# Patient Record
Sex: Female | Born: 1990 | Race: White | Hispanic: No | Smoking: Never smoker
Health system: Southern US, Community
[De-identification: ages and names within clinical notes are randomized; demographics above are authoritative.]

## PROBLEM LIST (undated history)

## (undated) ENCOUNTER — Inpatient Hospital Stay (HOSPITAL_COMMUNITY): Payer: Self-pay

## (undated) DIAGNOSIS — F419 Anxiety disorder, unspecified: Secondary | ICD-10-CM

## (undated) DIAGNOSIS — D649 Anemia, unspecified: Secondary | ICD-10-CM

## (undated) DIAGNOSIS — Z8619 Personal history of other infectious and parasitic diseases: Secondary | ICD-10-CM

## (undated) DIAGNOSIS — Z8759 Personal history of other complications of pregnancy, childbirth and the puerperium: Secondary | ICD-10-CM

## (undated) DIAGNOSIS — F329 Major depressive disorder, single episode, unspecified: Secondary | ICD-10-CM

## (undated) DIAGNOSIS — Z87442 Personal history of urinary calculi: Secondary | ICD-10-CM

## (undated) DIAGNOSIS — O139 Gestational [pregnancy-induced] hypertension without significant proteinuria, unspecified trimester: Secondary | ICD-10-CM

## (undated) DIAGNOSIS — E669 Obesity, unspecified: Secondary | ICD-10-CM

## (undated) DIAGNOSIS — E041 Nontoxic single thyroid nodule: Secondary | ICD-10-CM

## (undated) DIAGNOSIS — G43909 Migraine, unspecified, not intractable, without status migrainosus: Secondary | ICD-10-CM

## (undated) DIAGNOSIS — F32A Depression, unspecified: Secondary | ICD-10-CM

## (undated) HISTORY — DX: Gestational (pregnancy-induced) hypertension without significant proteinuria, unspecified trimester: O13.9

## (undated) HISTORY — DX: Personal history of other complications of pregnancy, childbirth and the puerperium: Z87.59

## (undated) HISTORY — DX: Personal history of other infectious and parasitic diseases: Z86.19

## (undated) HISTORY — PX: BARIATRIC SURGERY: SHX1103

## (undated) HISTORY — DX: Personal history of urinary calculi: Z87.442

## (undated) HISTORY — PX: WISDOM TOOTH EXTRACTION: SHX21

---
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
## 2008-04-26 ENCOUNTER — Emergency Department (HOSPITAL_COMMUNITY): Admission: EM | Admit: 2008-04-26 | Discharge: 2008-04-26 | Payer: Self-pay | Admitting: Family Medicine

## 2009-01-19 HISTORY — PX: CHOLECYSTECTOMY: SHX55

## 2009-01-30 ENCOUNTER — Ambulatory Visit: Payer: Self-pay | Admitting: Advanced Practice Midwife

## 2009-01-30 ENCOUNTER — Inpatient Hospital Stay (HOSPITAL_COMMUNITY): Admission: AD | Admit: 2009-01-30 | Discharge: 2009-01-30 | Payer: Self-pay | Admitting: Obstetrics and Gynecology

## 2009-02-11 ENCOUNTER — Emergency Department (HOSPITAL_COMMUNITY): Admission: EM | Admit: 2009-02-11 | Discharge: 2009-02-11 | Payer: Self-pay | Admitting: Emergency Medicine

## 2009-02-14 ENCOUNTER — Emergency Department (HOSPITAL_COMMUNITY): Admission: EM | Admit: 2009-02-14 | Discharge: 2009-02-14 | Payer: Self-pay | Admitting: Emergency Medicine

## 2010-02-03 ENCOUNTER — Ambulatory Visit (HOSPITAL_COMMUNITY): Admission: EM | Admit: 2010-02-03 | Discharge: 2010-02-04 | Payer: Self-pay | Admitting: Emergency Medicine

## 2010-02-05 ENCOUNTER — Emergency Department (HOSPITAL_COMMUNITY): Admission: EM | Admit: 2010-02-05 | Discharge: 2010-02-05 | Payer: Self-pay | Admitting: Emergency Medicine

## 2010-06-24 ENCOUNTER — Emergency Department (HOSPITAL_COMMUNITY)
Admission: EM | Admit: 2010-06-24 | Discharge: 2010-06-24 | Disposition: A | Discharge status: Home / Self Care | Payer: Self-pay | Attending: Emergency Medicine | Admitting: Emergency Medicine

## 2010-06-24 ENCOUNTER — Emergency Department (HOSPITAL_COMMUNITY): Payer: Self-pay

## 2010-06-24 DIAGNOSIS — N898 Other specified noninflammatory disorders of vagina: Secondary | ICD-10-CM | POA: Insufficient documentation

## 2010-06-24 DIAGNOSIS — R1031 Right lower quadrant pain: Secondary | ICD-10-CM | POA: Insufficient documentation

## 2010-06-24 LAB — COMPREHENSIVE METABOLIC PANEL
AST: 24 U/L (ref 0–37)
BUN: 7 mg/dL (ref 6–23)
Creatinine, Ser: 0.66 mg/dL (ref 0.4–1.2)
GFR calc Af Amer: >60 mL/min (ref >60)
GFR calc non Af Amer: >60 mL/min (ref >60)
Glucose, Bld: 73 mg/dL (ref 70–99)
Potassium: 3.6 mEq/L (ref 3.5–5.1)
Total Bilirubin: 0.7 mg/dL (ref 0.3–1.2)
Total Protein: 8.1 g/dL (ref 6.0–8.3)

## 2010-06-24 LAB — URINALYSIS, ROUTINE W REFLEX MICROSCOPIC
Ketones, ur: NEGATIVE mg/dL
Leukocytes, UA: NEGATIVE
Nitrite: NEGATIVE
Specific Gravity, Urine: 1.023 (ref 1.005–1.030)
Urine Glucose, Fasting: NEGATIVE mg/dL
pH: 5.5 (ref 5.0–8.0)

## 2010-06-24 LAB — DIFFERENTIAL
Basophils Absolute: 0.1 10*3/uL (ref 0.0–0.1)
Eosinophils Relative: 2 % (ref 0–5)
Lymphocytes Relative: 27 % (ref 12–46)
Neutro Abs: 8.1 10*3/uL — ABNORMAL HIGH (ref 1.7–7.7)

## 2010-06-24 LAB — CBC
HCT: 40.6 % (ref 36.0–46.0)
MCH: 26.5 pg (ref 26.0–34.0)
MCV: 79 fL (ref 78.0–100.0)
RBC: 5.14 MIL/uL — ABNORMAL HIGH (ref 3.87–5.11)
WBC: 12.5 10*3/uL — ABNORMAL HIGH (ref 4.0–10.5)

## 2010-06-24 LAB — WET PREP, GENITAL: Yeast Wet Prep HPF POC: NONE SEEN

## 2010-06-24 LAB — POCT PREGNANCY, URINE: Preg Test, Ur: NEGATIVE

## 2010-06-24 LAB — LIPASE, BLOOD: Lipase: 18 U/L (ref 11–59)

## 2010-06-24 LAB — URINE MICROSCOPIC-ADD ON

## 2010-06-24 MED ORDER — IOHEXOL 300 MG/ML  SOLN: 125.0000 mL | Freq: Once | INTRAMUSCULAR | Status: DC | PRN

## 2010-06-26 LAB — GC/CHLAMYDIA PROBE AMP, GENITAL: GC Probe Amp, Genital: NEGATIVE

## 2010-08-03 LAB — COMPREHENSIVE METABOLIC PANEL
ALT: 11 U/L (ref 0–35)
AST: 21 U/L (ref 0–37)
Albumin: 3.6 g/dL (ref 3.5–5.2)
Alkaline Phosphatase: 95 U/L (ref 39–117)
BUN: 7 mg/dL (ref 6–23)
Calcium: 9.1 mg/dL (ref 8.4–10.5)
Creatinine, Ser: 0.6 mg/dL (ref 0.4–1.2)
Total Protein: 7.5 g/dL (ref 6.0–8.3)

## 2010-08-03 LAB — CBC
HCT: 40.2 % (ref 36.0–46.0)
MCH: 26.9 pg (ref 26.0–34.0)
MCHC: 33.3 g/dL (ref 30.0–36.0)
MCHC: 33.5 g/dL (ref 30.0–36.0)
MCV: 80.7 fL (ref 78.0–100.0)
MCV: 80.8 fL (ref 78.0–100.0)
Platelets: 233 10*3/uL (ref 150–400)
RDW: 14.1 % (ref 11.5–15.5)
RDW: 14.5 % (ref 11.5–15.5)
WBC: 8.3 10*3/uL (ref 4.0–10.5)

## 2010-08-03 LAB — URINALYSIS, ROUTINE W REFLEX MICROSCOPIC
Nitrite: NEGATIVE
Protein, ur: NEGATIVE mg/dL
Urobilinogen, UA: 1 mg/dL (ref 0.0–1.0)

## 2010-08-03 LAB — BASIC METABOLIC PANEL
BUN: 7 mg/dL (ref 6–23)
Creatinine, Ser: 0.67 mg/dL (ref 0.4–1.2)
GFR calc non Af Amer: >60 mL/min (ref >60)

## 2010-08-03 LAB — DIFFERENTIAL
Basophils Absolute: 0 10*3/uL (ref 0.0–0.1)
Basophils Absolute: 0.1 10*3/uL (ref 0.0–0.1)
Basophils Relative: 1 % (ref 0–1)
Basophils Relative: 1 % (ref 0–1)
Eosinophils Absolute: 0.3 10*3/uL (ref 0.0–0.7)
Eosinophils Relative: 2 % (ref 0–5)
Eosinophils Relative: 3 % (ref 0–5)
Lymphocytes Relative: 49 % — ABNORMAL HIGH (ref 12–46)
Neutro Abs: 3.5 10*3/uL (ref 1.7–7.7)

## 2010-08-03 LAB — WET PREP, GENITAL
Clue Cells Wet Prep HPF POC: NONE SEEN
Trich, Wet Prep: NONE SEEN
WBC, Wet Prep HPF POC: NONE SEEN

## 2010-08-03 LAB — LIPASE, BLOOD: Lipase: 21 U/L (ref 11–59)

## 2010-08-25 LAB — URINALYSIS, ROUTINE W REFLEX MICROSCOPIC
Ketones, ur: NEGATIVE mg/dL
Nitrite: NEGATIVE
Urobilinogen, UA: 0.2 mg/dL (ref 0.0–1.0)
pH: 5.5 (ref 5.0–8.0)

## 2010-08-25 LAB — GC/CHLAMYDIA PROBE AMP, GENITAL: Chlamydia, DNA Probe: NEGATIVE

## 2010-08-25 LAB — WET PREP, GENITAL

## 2010-09-08 ENCOUNTER — Emergency Department (HOSPITAL_COMMUNITY)
Admission: EM | Admit: 2010-09-08 | Discharge: 2010-09-09 | Disposition: A | Discharge status: Home / Self Care | Payer: Self-pay | Attending: Emergency Medicine | Admitting: Emergency Medicine

## 2010-09-08 DIAGNOSIS — B373 Candidiasis of vulva and vagina: Secondary | ICD-10-CM | POA: Insufficient documentation

## 2010-09-08 DIAGNOSIS — J020 Streptococcal pharyngitis: Secondary | ICD-10-CM | POA: Insufficient documentation

## 2010-09-08 DIAGNOSIS — B3731 Acute candidiasis of vulva and vagina: Secondary | ICD-10-CM | POA: Insufficient documentation

## 2010-09-09 LAB — URINALYSIS, ROUTINE W REFLEX MICROSCOPIC
Bilirubin Urine: NEGATIVE
Glucose, UA: NEGATIVE mg/dL
Hgb urine dipstick: NEGATIVE
Ketones, ur: NEGATIVE mg/dL
Nitrite: NEGATIVE
Protein, ur: NEGATIVE mg/dL
Specific Gravity, Urine: 1.026 (ref 1.005–1.030)
Urobilinogen, UA: 1 mg/dL (ref 0.0–1.0)
pH: 6.5 (ref 5.0–8.0)

## 2010-09-09 LAB — WET PREP, GENITAL
Clue Cells Wet Prep HPF POC: NONE SEEN
Trich, Wet Prep: NONE SEEN
WBC, Wet Prep HPF POC: NONE SEEN

## 2010-09-09 LAB — URINE MICROSCOPIC-ADD ON

## 2010-09-09 LAB — RAPID STREP SCREEN (MED CTR MEBANE ONLY): Streptococcus, Group A Screen (Direct): POSITIVE — AB

## 2010-09-11 LAB — GC/CHLAMYDIA PROBE AMP, GENITAL
Chlamydia, DNA Probe: NEGATIVE
GC Probe Amp, Genital: NEGATIVE

## 2010-11-26 ENCOUNTER — Emergency Department (HOSPITAL_COMMUNITY): Payer: Self-pay

## 2010-11-26 ENCOUNTER — Emergency Department (HOSPITAL_COMMUNITY)
Admission: EM | Admit: 2010-11-26 | Discharge: 2010-11-27 | Disposition: A | Discharge status: Home / Self Care | Payer: Self-pay | Attending: Emergency Medicine | Admitting: Emergency Medicine

## 2010-11-26 DIAGNOSIS — S60229A Contusion of unspecified hand, initial encounter: Secondary | ICD-10-CM | POA: Insufficient documentation

## 2010-11-26 DIAGNOSIS — M79609 Pain in unspecified limb: Secondary | ICD-10-CM | POA: Insufficient documentation

## 2011-01-26 ENCOUNTER — Emergency Department (HOSPITAL_COMMUNITY)
Admission: EM | Admit: 2011-01-26 | Discharge: 2011-01-27 | Disposition: A | Discharge status: Home / Self Care | Payer: Self-pay | Attending: Emergency Medicine | Admitting: Emergency Medicine

## 2011-01-26 ENCOUNTER — Emergency Department (HOSPITAL_COMMUNITY): Payer: Self-pay

## 2011-01-26 DIAGNOSIS — R599 Enlarged lymph nodes, unspecified: Secondary | ICD-10-CM | POA: Insufficient documentation

## 2011-01-26 DIAGNOSIS — M542 Cervicalgia: Secondary | ICD-10-CM | POA: Insufficient documentation

## 2011-01-26 DIAGNOSIS — J039 Acute tonsillitis, unspecified: Secondary | ICD-10-CM | POA: Insufficient documentation

## 2011-01-26 LAB — RAPID STREP SCREEN (MED CTR MEBANE ONLY): Streptococcus, Group A Screen (Direct): NEGATIVE

## 2011-01-27 LAB — CBC
MCH: 25.8 pg — ABNORMAL LOW (ref 26.0–34.0)
MCHC: 33.8 g/dL (ref 30.0–36.0)
RDW: 14.4 % (ref 11.5–15.5)

## 2011-01-27 LAB — BASIC METABOLIC PANEL
Calcium: 10 mg/dL (ref 8.4–10.5)
GFR calc Af Amer: >60 mL/min (ref >60)
GFR calc non Af Amer: >60 mL/min (ref >60)
Glucose, Bld: 93 mg/dL (ref 70–99)
Potassium: 3.6 mEq/L (ref 3.5–5.1)
Sodium: 138 mEq/L (ref 135–145)

## 2011-01-27 MED ORDER — IOHEXOL 300 MG/ML  SOLN
100.0000 mL | Freq: Once | INTRAMUSCULAR | Status: AC | PRN
  Administered 2011-01-27: 100 mL via INTRAVENOUS

## 2011-08-03 ENCOUNTER — Encounter (HOSPITAL_COMMUNITY): Payer: Self-pay | Admitting: *Deleted

## 2011-08-03 ENCOUNTER — Inpatient Hospital Stay (HOSPITAL_COMMUNITY)
Admission: AD | Admit: 2011-08-03 | Discharge: 2011-08-03 | Disposition: A | Discharge status: Home / Self Care | Payer: Self-pay | Source: Ambulatory Visit | Attending: Obstetrics & Gynecology | Admitting: Obstetrics & Gynecology

## 2011-08-03 DIAGNOSIS — R109 Unspecified abdominal pain: Secondary | ICD-10-CM | POA: Insufficient documentation

## 2011-08-03 LAB — URINALYSIS, ROUTINE W REFLEX MICROSCOPIC
Glucose, UA: NEGATIVE mg/dL
Ketones, ur: NEGATIVE mg/dL
Protein, ur: NEGATIVE mg/dL
Urobilinogen, UA: 0.2 mg/dL (ref 0.0–1.0)

## 2011-08-03 LAB — URINE MICROSCOPIC-ADD ON

## 2011-08-03 LAB — POCT PREGNANCY, URINE: Preg Test, Ur: NEGATIVE

## 2011-08-03 NOTE — MAU Note (Signed)
Pt states lower abd pain l side only began Monday. Menstrual cycle late by 6 days, is very light when menses usually heavy. Pain constant. Deneis uti s/s. Wearing tampon, states is bleeding lightly.

## 2011-08-03 NOTE — MAU Note (Signed)
Pt in c/o lower left quadrant abd pain since Monday.  Normal period in Feb 06/27/11.  Started bleeding lightly yesterday, but states periods are typically heavy.  Denies any discharge.

## 2011-09-25 ENCOUNTER — Emergency Department (HOSPITAL_COMMUNITY): Payer: Self-pay

## 2011-09-25 ENCOUNTER — Emergency Department (HOSPITAL_COMMUNITY)
Admission: EM | Admit: 2011-09-25 | Discharge: 2011-09-26 | Disposition: A | Discharge status: Home / Self Care | Payer: Self-pay | Attending: Emergency Medicine | Admitting: Emergency Medicine

## 2011-09-25 ENCOUNTER — Encounter (HOSPITAL_COMMUNITY): Payer: Self-pay | Admitting: *Deleted

## 2011-09-25 DIAGNOSIS — R1032 Left lower quadrant pain: Secondary | ICD-10-CM | POA: Insufficient documentation

## 2011-09-25 DIAGNOSIS — R112 Nausea with vomiting, unspecified: Secondary | ICD-10-CM | POA: Insufficient documentation

## 2011-09-25 DIAGNOSIS — R350 Frequency of micturition: Secondary | ICD-10-CM | POA: Insufficient documentation

## 2011-09-25 DIAGNOSIS — N12 Tubulo-interstitial nephritis, not specified as acute or chronic: Secondary | ICD-10-CM | POA: Insufficient documentation

## 2011-09-25 LAB — URINALYSIS, ROUTINE W REFLEX MICROSCOPIC
Ketones, ur: NEGATIVE mg/dL
Nitrite: NEGATIVE
pH: 6 (ref 5.0–8.0)

## 2011-09-25 LAB — URINE MICROSCOPIC-ADD ON

## 2011-09-25 MED ORDER — SODIUM CHLORIDE 0.9 % IV BOLUS (SEPSIS)
1000.0000 mL | Freq: Once | INTRAVENOUS | Status: AC
  Administered 2011-09-25: 1000 mL via INTRAVENOUS

## 2011-09-25 MED ORDER — MORPHINE SULFATE 4 MG/ML IJ SOLN
6.0000 mg | Freq: Once | INTRAMUSCULAR | Status: AC
  Administered 2011-09-25: 6 mg via INTRAVENOUS
  Filled 2011-09-25: qty 2

## 2011-09-25 MED ORDER — ONDANSETRON HCL 4 MG/2ML IJ SOLN
4.0000 mg | Freq: Once | INTRAMUSCULAR | Status: AC
  Administered 2011-09-25: 4 mg via INTRAVENOUS
  Filled 2011-09-25: qty 2

## 2011-09-25 NOTE — ED Notes (Signed)
Pt in c/o left lower quad abd pain running into back, pain over last two days but increased tonight, also n/v, pt tearful in triage

## 2011-09-26 MED ORDER — CEPHALEXIN 500 MG PO CAPS: 500.0000 mg | ORAL_CAPSULE | Freq: Four times a day (QID) | ORAL | Status: AC

## 2011-09-26 MED ORDER — HYDROMORPHONE HCL PF 1 MG/ML IJ SOLN
1.0000 mg | Freq: Once | INTRAMUSCULAR | Status: AC
  Administered 2011-09-26: 1 mg via INTRAVENOUS
  Filled 2011-09-26: qty 1

## 2011-09-26 MED ORDER — PROMETHAZINE HCL 25 MG PO TABS: 25.0000 mg | ORAL_TABLET | Freq: Four times a day (QID) | ORAL | Status: DC | PRN

## 2011-09-26 MED ORDER — CEPHALEXIN 500 MG PO CAPS
1000.0000 mg | ORAL_CAPSULE | Freq: Once | ORAL | Status: AC
  Administered 2011-09-26: 1000 mg via ORAL
  Filled 2011-09-26: qty 2

## 2011-09-26 MED ORDER — HYDROCODONE-ACETAMINOPHEN 5-325 MG PO TABS: 1.0000 | ORAL_TABLET | ORAL | Status: AC | PRN

## 2011-09-26 MED ORDER — CEPHALEXIN 500 MG PO CAPS: 500.0000 mg | ORAL_CAPSULE | Freq: Four times a day (QID) | ORAL | Status: DC

## 2011-09-26 MED ORDER — HYDROCODONE-ACETAMINOPHEN 5-325 MG PO TABS: 1.0000 | ORAL_TABLET | ORAL | Status: DC | PRN

## 2011-09-26 NOTE — ED Provider Notes (Signed)
History     CSN: [id number]  Arrival date & time 09/25/11  0865   First MD Initiated Contact with Patient 09/25/11 2247      Chief Complaint  Patient presents with  . Abdominal Pain    (Consider location/radiation/quality/duration/timing/severity/associated sxs/prior treatment) The history is provided by the patient.   patient reports 2-3 days if left lower quadrant abdominal pain with occasional radiation to her left flank.  She denies dysuria but does have urinary frequency.  She has reported several episodes of nausea and vomiting.  She's not vomited in the past 12 hours.  Reports her pain is mild to moderate at this time.  Nothing worsens her pain.  Nothing improves her pain.  Her pain is constant at this time.  She denies fevers and chills.  She's never had a history of ureteral stones.  She's never been diagnosed with ovarian cyst.  Past Medical History  Diagnosis Date  . No pertinent past medical history     Past Surgical History  Procedure Date  . Cholecystectomy     Family History  Problem Relation Age of Onset  . Anesthesia problems Neg Hx     History  Substance Use Topics  . Smoking status: Never Smoker   . Smokeless tobacco: Not on file  . Alcohol Use: No    OB History    Grav Para Term Preterm Abortions TAB SAB Ect Mult Living   0               Review of Systems  Gastrointestinal: Positive for abdominal pain.  All other systems reviewed and are negative.    Allergies  Review of patient's allergies indicates no known allergies.  Home Medications   Current Outpatient Rx  Name Route Sig Dispense Refill  . IBUPROFEN 400 MG PO TABS Oral Take 400 mg by mouth every 6 (six) hours as needed. For pain relief    . CEPHALEXIN 500 MG PO CAPS Oral Take 1 capsule (500 mg total) by mouth 4 (four) times daily. 28 capsule 0  . HYDROCODONE-ACETAMINOPHEN 5-325 MG PO TABS Oral Take 1 tablet by mouth every 4 (four) hours as needed for pain. 15 tablet 0  .  PROMETHAZINE HCL 25 MG PO TABS Oral Take 1 tablet (25 mg total) by mouth every 6 (six) hours as needed for nausea. 30 tablet 0    BP 95/46  Pulse 91  Temp(Src) 99 F (37.2 C) (Oral)  Resp 24  Wt 270 lb (122.471 kg)  SpO2 100%  LMP 08/30/2011  Physical Exam  Nursing note and vitals reviewed. Constitutional: She is oriented to person, place, and time. She appears well-developed and well-nourished. No distress.  HENT:  Head: Normocephalic and atraumatic.  Eyes: EOM are normal.  Neck: Normal range of motion.  Cardiovascular: Normal rate, regular rhythm and normal heart sounds.   Pulmonary/Chest: Effort normal and breath sounds normal.  Abdominal: Soft. She exhibits no distension. There is no tenderness.  Genitourinary:       Mild left CVA tenderness  Musculoskeletal: Normal range of motion.  Neurological: She is alert and oriented to person, place, and time.  Skin: Skin is warm and dry.  Psychiatric: She has a normal mood and affect. Judgment normal.    ED Course  Procedures (including critical care time)  Labs Reviewed  URINALYSIS, ROUTINE W REFLEX MICROSCOPIC - Abnormal; Notable for the following:    APPearance CLOUDY (*)    Protein, ur 100 (*)    Leukocytes, UA  TRACE (*)    All other components within normal limits  URINE MICROSCOPIC-ADD ON - Abnormal; Notable for the following:    Squamous Epithelial / LPF FEW (*)    Bacteria, UA FEW (*)    All other components within normal limits  PREGNANCY, URINE  URINE CULTURE   Ct Abdomen Pelvis Wo Contrast  09/26/2011  *RADIOLOGY REPORT*  Clinical Data: Left lower quadrant pain.  CT ABDOMEN AND PELVIS WITHOUT CONTRAST  Technique:  Multidetector CT imaging of the abdomen and pelvis was performed following the standard protocol without intravenous contrast.  Comparison: 06/24/2010  Findings: No focal abnormalities seen in the liver or spleen.  The stomach, duodenum, pancreas, and adrenal glands are unremarkable. Gallbladder is  surgically absent.  The right kidney is unremarkable.  There is some subtle peri pelvic and proximal periureteric edema on the left.  No stones can be seen in either kidney.  No ureteral or bladder stones.  No abdominal aortic aneurysm.  No free fluid or lymphadenopathy in the abdomen.  Imaging through the pelvis shows no free intraperitoneal fluid.  No pelvic sidewall lymphadenopathy.  No evidence for colonic diverticulitis.  The terminal ileum is normal.  The appendix is normal.  Uterus is unremarkable.  No adnexal mass. Bone windows reveal no worrisome lytic or sclerotic osseous lesions.  IMPRESSION: No urinary stones. While not definite, there may be some subtle edema or inflammation around the left renal pelvis and left ureter. This may be related to recent stone passage although left renal infection could also produce this appearance.  Original Report Authenticated By: ERIC A. MANSELL, M.D.     1. Pyelonephritis       MDM  I suspect this patient's symptoms may represent early pyelonephritis.  She is nontoxic appearing.  IV fluids given.  She feels much better at this time.  CT scan shows possible recently passed ureteral stone versus infection.  Her urine has a significant amount white blood cells.  Patient given Keflex in the emergency department and will be discharged home with a week's worth of Keflex.  Also home with pain medicine and antinausea medicine        Lyanne Co, MD 09/26/11 [phone]

## 2011-09-27 LAB — URINE CULTURE: Colony Count: 3000

## 2012-05-26 ENCOUNTER — Encounter (HOSPITAL_COMMUNITY): Payer: Self-pay | Admitting: *Deleted

## 2012-05-26 ENCOUNTER — Emergency Department (HOSPITAL_COMMUNITY)
Admission: EM | Admit: 2012-05-26 | Discharge: 2012-05-26 | Disposition: A | Discharge status: Home / Self Care | Payer: BC Managed Care – PPO | Attending: Emergency Medicine | Admitting: Emergency Medicine

## 2012-05-26 DIAGNOSIS — R509 Fever, unspecified: Secondary | ICD-10-CM | POA: Insufficient documentation

## 2012-05-26 DIAGNOSIS — J029 Acute pharyngitis, unspecified: Secondary | ICD-10-CM | POA: Insufficient documentation

## 2012-05-26 DIAGNOSIS — Z8679 Personal history of other diseases of the circulatory system: Secondary | ICD-10-CM | POA: Insufficient documentation

## 2012-05-26 DIAGNOSIS — B9789 Other viral agents as the cause of diseases classified elsewhere: Secondary | ICD-10-CM | POA: Insufficient documentation

## 2012-05-26 DIAGNOSIS — R112 Nausea with vomiting, unspecified: Secondary | ICD-10-CM | POA: Insufficient documentation

## 2012-05-26 DIAGNOSIS — B349 Viral infection, unspecified: Secondary | ICD-10-CM

## 2012-05-26 HISTORY — DX: Migraine, unspecified, not intractable, without status migrainosus: G43.909

## 2012-05-26 MED ORDER — PROMETHAZINE HCL 25 MG PO TABS: 25.0000 mg | ORAL_TABLET | Freq: Four times a day (QID) | ORAL | Status: DC | PRN

## 2012-05-26 MED ORDER — HYDROCOD POLST-CHLORPHEN POLST 10-8 MG/5ML PO LQCR
5.0000 mL | Freq: Once | ORAL | Status: AC
  Administered 2012-05-26: 5 mL via ORAL
  Filled 2012-05-26: qty 5

## 2012-05-26 MED ORDER — HYDROCODONE-ACETAMINOPHEN 7.5-500 MG/15ML PO SOLN: ORAL | Status: DC

## 2012-05-26 MED ORDER — ONDANSETRON 8 MG PO TBDP
8.0000 mg | ORAL_TABLET | Freq: Once | ORAL | Status: AC
  Administered 2012-05-26: 8 mg via ORAL
  Filled 2012-05-26: qty 1

## 2012-05-26 NOTE — ED Provider Notes (Signed)
History     CSN: [id number]  Arrival date & time 05/26/12  1140   First MD Initiated Contact with Patient 05/26/12 1224      Chief Complaint  Patient presents with  . Sore Throat  . Fever  . Nausea  . Emesis    (Consider location/radiation/quality/duration/timing/severity/associated sxs/prior treatment) HPI.... cough, fever, sore throat, headache, nausea, vomiting since Friday.  Able to keep fluids down.  Nothing makes symptoms better or worse.  Severity is mild to moderate  Past Medical History  Diagnosis Date  . No pertinent past medical history   . Migraine     Past Surgical History  Procedure Date  . Cholecystectomy     Family History  Problem Relation Age of Onset  . Anesthesia problems Neg Hx     History  Substance Use Topics  . Smoking status: Never Smoker   . Smokeless tobacco: Not on file  . Alcohol Use: Yes     Comment: occasionally    OB History    Grav Para Term Preterm Abortions TAB SAB Ect Mult Living   0               Review of Systems  All other systems reviewed and are negative.    Allergies  Review of patient's allergies indicates no known allergies.  Home Medications   Current Outpatient Rx  Name  Route  Sig  Dispense  Refill  . ASPIRIN-ACETAMINOPHEN-CAFFEINE 250-250-65 MG PO TABS   Oral   Take 2 tablets by mouth every 6 (six) hours as needed. For pain.         Marland Kitchen VITAMIN D 1000 UNITS PO TABS   Oral   Take 1,000 Units by mouth daily.         Marland Kitchen HYDROCODONE-ACETAMINOPHEN 7.5-500 MG/15ML PO SOLN      15 ML by mouth every 4 hours when necessary cough   150 mL   0   . PROMETHAZINE HCL 25 MG PO TABS   Oral   Take 1 tablet (25 mg total) by mouth every 6 (six) hours as needed for nausea.   20 tablet   0     BP 130/84  Pulse 106  Temp 99.7 F (37.6 C) (Oral)  Resp 16  SpO2 99%  LMP 05/08/2012  Physical Exam  Nursing note and vitals reviewed. Constitutional: She is oriented to person, place, and time. She appears  well-developed and well-nourished.       Nontoxic  HENT:  Head: Normocephalic and atraumatic.       Mild pharyngeal erythema  Eyes: Conjunctivae normal and EOM are normal. Pupils are equal, round, and reactive to light.  Neck: Normal range of motion. Neck supple.  Cardiovascular: Normal rate, regular rhythm and normal heart sounds.   Pulmonary/Chest: Effort normal and breath sounds normal.  Abdominal: Soft. Bowel sounds are normal.  Musculoskeletal: Normal range of motion.  Neurological: She is alert and oriented to person, place, and time.  Skin: Skin is warm and dry.  Psychiatric: She has a normal mood and affect.    ED Course  Procedures (including critical care time)  Labs Reviewed - No data to display No results found.   1. Viral syndrome       MDM  History and physical consistent with viral syndrome. Discharge him with Lortab elixir for cough and Phenergan for nausea.  Normal vital signs        Donnetta Hutching, MD 05/26/12 1538

## 2012-05-26 NOTE — ED Notes (Signed)
Pt reports sore throat with cough, fever, n/v, migraine since Friday (3 days). Emesis x3 in last 12 hours.

## 2012-05-28 ENCOUNTER — Encounter (HOSPITAL_COMMUNITY): Payer: Self-pay | Admitting: *Deleted

## 2012-05-28 ENCOUNTER — Emergency Department (HOSPITAL_COMMUNITY)
Admission: EM | Admit: 2012-05-28 | Discharge: 2012-05-28 | Disposition: A | Discharge status: Home / Self Care | Payer: BC Managed Care – PPO | Attending: Emergency Medicine | Admitting: Emergency Medicine

## 2012-05-28 ENCOUNTER — Emergency Department (HOSPITAL_COMMUNITY): Payer: BC Managed Care – PPO

## 2012-05-28 DIAGNOSIS — R51 Headache: Secondary | ICD-10-CM | POA: Insufficient documentation

## 2012-05-28 DIAGNOSIS — Z3202 Encounter for pregnancy test, result negative: Secondary | ICD-10-CM | POA: Insufficient documentation

## 2012-05-28 DIAGNOSIS — Z8679 Personal history of other diseases of the circulatory system: Secondary | ICD-10-CM | POA: Insufficient documentation

## 2012-05-28 DIAGNOSIS — R059 Cough, unspecified: Secondary | ICD-10-CM | POA: Insufficient documentation

## 2012-05-28 DIAGNOSIS — B9789 Other viral agents as the cause of diseases classified elsewhere: Secondary | ICD-10-CM | POA: Insufficient documentation

## 2012-05-28 DIAGNOSIS — R110 Nausea: Secondary | ICD-10-CM | POA: Insufficient documentation

## 2012-05-28 DIAGNOSIS — R0602 Shortness of breath: Secondary | ICD-10-CM | POA: Insufficient documentation

## 2012-05-28 DIAGNOSIS — R05 Cough: Secondary | ICD-10-CM | POA: Insufficient documentation

## 2012-05-28 DIAGNOSIS — IMO0001 Reserved for inherently not codable concepts without codable children: Secondary | ICD-10-CM | POA: Insufficient documentation

## 2012-05-28 DIAGNOSIS — J029 Acute pharyngitis, unspecified: Secondary | ICD-10-CM | POA: Insufficient documentation

## 2012-05-28 DIAGNOSIS — B349 Viral infection, unspecified: Secondary | ICD-10-CM

## 2012-05-28 NOTE — ED Notes (Signed)
Pt states that she was seen on Monday and diagnosed with the "flu"; pt reports that she does not feel like she is getting any better; pt c/o generalized body aches, feeling tired and weak; nausea in the morning with some vomiting but improves with medication that was provided on Monday.

## 2012-05-28 NOTE — ED Provider Notes (Signed)
History     CSN: [id number]  Arrival date & time 05/28/12  1840   First MD Initiated Contact with Patient 05/28/12 1953      Chief Complaint  Patient presents with  . Influenza    (Consider location/radiation/quality/duration/timing/severity/associated sxs/prior treatment) HPI Comments: Patient was seen 2 days ago Dx with influenza/viral syndrome here tonight because cough is getting worse Has been using Phenergan for nausea and hydrocodone for cough as prescribed   Patient is a 22 y.o. female presenting with flu symptoms. The history is provided by the patient.  Influenza This is a new problem. The current episode started in the past 7 days. The problem has been gradually worsening. Associated symptoms include coughing, a fever, headaches, myalgias, nausea and a sore throat. Pertinent negatives include no chest pain, rash or weakness. Nothing aggravates the symptoms. She has tried NSAIDs for the symptoms. The treatment provided no relief.    Past Medical History  Diagnosis Date  . No pertinent past medical history   . Migraine     Past Surgical History  Procedure Date  . Cholecystectomy     Family History  Problem Relation Age of Onset  . Anesthesia problems Neg Hx     History  Substance Use Topics  . Smoking status: Never Smoker   . Smokeless tobacco: Not on file  . Alcohol Use: Yes     Comment: occasionally    OB History    Grav Para Term Preterm Abortions TAB SAB Ect Mult Living   0               Review of Systems  Constitutional: Positive for fever.  HENT: Positive for sore throat.   Respiratory: Positive for cough and shortness of breath. Negative for wheezing.   Cardiovascular: Negative for chest pain.  Gastrointestinal: Positive for nausea.  Musculoskeletal: Positive for myalgias.  Skin: Negative for rash.  Neurological: Positive for headaches. Negative for dizziness and weakness.    Allergies  Review of patient's allergies indicates no known  allergies.  Home Medications   Current Outpatient Rx  Name  Route  Sig  Dispense  Refill  . VITAMIN D 1000 UNITS PO TABS   Oral   Take 1,000 Units by mouth daily.         Marland Kitchen HYDROCODONE-ACETAMINOPHEN 7.5-500 MG/15ML PO SOLN   Oral   Take 15 mLs by mouth every 4 (four) hours as needed. 15 ML by mouth every 4 hours when necessary cough         . PROMETHAZINE HCL 25 MG PO TABS   Oral   Take 25 mg by mouth every 6 (six) hours as needed. Nausea           BP 134/75  Pulse 93  Temp 98.6 F (37 C) (Oral)  Resp 16  SpO2 98%  LMP 05/08/2012  Physical Exam  Constitutional: She is oriented to person, place, and time. She appears well-developed and well-nourished.       Morbidly obese  HENT:  Head: Normocephalic and atraumatic.  Mouth/Throat: Oropharynx is clear and moist.  Eyes: Pupils are equal, round, and reactive to light.  Neck: Normal range of motion.  Cardiovascular: Normal rate.   Pulmonary/Chest: Effort normal and breath sounds normal. She has no wheezes. She exhibits tenderness.       Intermittent dry cough   Abdominal: Soft. She exhibits no distension.  Musculoskeletal: Normal range of motion.  Neurological: She is alert and oriented to person, place, and time.  Skin: Skin is warm. There is pallor.    ED Course  Procedures (including critical care time)   Labs Reviewed  POCT PREGNANCY, URINE   Dg Chest 2 View  05/28/2012  *RADIOLOGY REPORT*  Clinical Data: Cough and fever  CHEST - 2 VIEW  Comparison: None.  Findings: Low lung volumes with normal heart size.  No infiltrates or failure.  Mild increased peribronchial thickening.  No effusion or pneumothorax.  Bones unremarkable.  IMPRESSION: Mild increased peribronchial thickening.  Viral pneumonitis not excluded.   Original Report Authenticated By: Davonna Belling, M.D.      1. Viral syndrome       MDM  Viral illness Xray shows viral irritation         Arman Filter, NP 05/28/12 2234

## 2012-05-28 NOTE — ED Notes (Signed)
Pt declined to have chest Xray until has pregnancy test; Pt concerned that she may be pregnant; Pregnancy test ordered per protocol.

## 2012-05-28 NOTE — ED Provider Notes (Signed)
Medical screening examination/treatment/procedure(s) were performed by non-physician practitioner and as supervising physician I was immediately available for consultation/collaboration.  Julena Barbour T Taegen Lennox, MD 05/28/12 2349 

## 2012-05-28 NOTE — ED Notes (Signed)
Pt reports recent diagnosis of flu, states "Its just not getting any better" reports has still having vomiting and diarrhea.

## 2012-07-21 ENCOUNTER — Inpatient Hospital Stay (HOSPITAL_COMMUNITY): Payer: BC Managed Care – PPO

## 2012-07-21 ENCOUNTER — Encounter (HOSPITAL_COMMUNITY): Payer: Self-pay | Admitting: *Deleted

## 2012-07-21 ENCOUNTER — Inpatient Hospital Stay (HOSPITAL_COMMUNITY)
Admission: AD | Admit: 2012-07-21 | Discharge: 2012-07-21 | Disposition: A | Discharge status: Home / Self Care | Payer: BC Managed Care – PPO | Source: Ambulatory Visit | Attending: Family Medicine | Admitting: Family Medicine

## 2012-07-21 DIAGNOSIS — O99891 Other specified diseases and conditions complicating pregnancy: Secondary | ICD-10-CM | POA: Insufficient documentation

## 2012-07-21 DIAGNOSIS — R109 Unspecified abdominal pain: Secondary | ICD-10-CM

## 2012-07-21 DIAGNOSIS — O26899 Other specified pregnancy related conditions, unspecified trimester: Secondary | ICD-10-CM

## 2012-07-21 DIAGNOSIS — R1032 Left lower quadrant pain: Secondary | ICD-10-CM | POA: Insufficient documentation

## 2012-07-21 LAB — HCG, QUANTITATIVE, PREGNANCY: hCG, Beta Chain, Quant, S: 153 m[IU]/mL — ABNORMAL HIGH (ref <5)

## 2012-07-21 LAB — WET PREP, GENITAL: Clue Cells Wet Prep HPF POC: NONE SEEN

## 2012-07-21 LAB — ABO/RH: ABO/RH(D): O POS

## 2012-07-21 LAB — URINALYSIS, ROUTINE W REFLEX MICROSCOPIC
Glucose, UA: NEGATIVE mg/dL
Hgb urine dipstick: NEGATIVE
Ketones, ur: NEGATIVE mg/dL
Protein, ur: NEGATIVE mg/dL

## 2012-07-21 LAB — CBC
Hemoglobin: 11.7 g/dL — ABNORMAL LOW (ref 12.0–15.0)
MCHC: 32.1 g/dL (ref 30.0–36.0)
RDW: 15.2 % (ref 11.5–15.5)
WBC: 8.3 10*3/uL (ref 4.0–10.5)

## 2012-07-21 NOTE — MAU Note (Signed)
C/o l sided pain that started this AM around 0400; + UPT today in MAU;

## 2012-07-21 NOTE — MAU Provider Note (Signed)
History     CSN: [id number]  Arrival date and time: 07/21/12 1707   None     Chief Complaint  Patient presents with  . Abdominal Pain   HPI 22 y.o. G2P0010 at [redacted]w[redacted]d with LLQ pain starting today around 4 AM. No bleeding or discharge.    Past Medical History  Diagnosis Date  . No pertinent past medical history   . Migraine     Past Surgical History  Procedure Laterality Date  . Cholecystectomy      Family History  Problem Relation Age of Onset  . Anesthesia problems Neg Hx   . Diabetes Mother   . Hypertension Mother   . Hypertension Father   . Hypertension Maternal Grandmother   . Hypertension Maternal Grandfather   . Hypertension Paternal Grandmother   . Hypertension Paternal Grandfather     History  Substance Use Topics  . Smoking status: Never Smoker   . Smokeless tobacco: Not on file  . Alcohol Use: Yes     Comment: occasionally    Allergies: No Known Allergies  Prescriptions prior to admission  Medication Sig Dispense Refill  . cholecalciferol (VITAMIN D) 1000 UNITS tablet Take 1,000 Units by mouth daily.      Marland Kitchen HYDROcodone-acetaminophen (LORTAB) 7.5-500 MG/15ML solution Take 15 mLs by mouth every 4 (four) hours as needed. 15 ML by mouth every 4 hours when necessary cough      . promethazine (PHENERGAN) 25 MG tablet Take 25 mg by mouth every 6 (six) hours as needed. Nausea        Review of Systems  Constitutional: Negative.   Respiratory: Negative.   Cardiovascular: Negative.   Gastrointestinal: Positive for abdominal pain. Negative for nausea, vomiting, diarrhea and constipation.  Genitourinary: Negative for dysuria, urgency, frequency, hematuria and flank pain.       Negative for vaginal bleeding, vaginal discharge  Musculoskeletal: Negative.   Neurological: Negative.   Psychiatric/Behavioral: Negative.    Physical Exam   Blood pressure 139/69, pulse 76, temperature 100 F (37.8 C), temperature source Oral, resp. rate 16, height 5\' 5"  (1.651  m), weight 270 lb (122.471 kg), last menstrual period 05/11/2012.  Physical Exam  Nursing note and vitals reviewed. Constitutional: She is oriented to person, place, and time. She appears well-developed and well-nourished. No distress.  Morbidly obese   Cardiovascular: Normal rate.   Respiratory: Effort normal.  GI: Soft. There is no tenderness.  Genitourinary: There is no tenderness or lesion on the right labia. There is no tenderness or lesion on the left labia. Uterus is not tender. Cervix exhibits no motion tenderness, no discharge and no friability. Right adnexum displays no mass, no tenderness and no fullness. Left adnexum displays tenderness. Left adnexum displays no mass and no fullness. No bleeding around the vagina. Vaginal discharge (clear, malodorous) found.  Exam limited by body habitus   Musculoskeletal: Normal range of motion.  Neurological: She is alert and oriented to person, place, and time.  Skin: Skin is warm and dry.  Psychiatric: She has a normal mood and affect.    MAU Course  Procedures Results for orders placed during the hospital encounter of 07/21/12 (from the past 24 hour(s))  URINALYSIS, ROUTINE W REFLEX MICROSCOPIC     Status: None   Collection Time    07/21/12  5:15 PM      Result Value Range   Color, Urine YELLOW  YELLOW   APPearance CLEAR  CLEAR   Specific Gravity, Urine 1.025  1.005 - 1.030  pH 6.0  5.0 - 8.0   Glucose, UA NEGATIVE  NEGATIVE mg/dL   Hgb urine dipstick NEGATIVE  NEGATIVE   Bilirubin Urine NEGATIVE  NEGATIVE   Ketones, ur NEGATIVE  NEGATIVE mg/dL   Protein, ur NEGATIVE  NEGATIVE mg/dL   Urobilinogen, UA 0.2  0.0 - 1.0 mg/dL   Nitrite NEGATIVE  NEGATIVE   Leukocytes, UA NEGATIVE  NEGATIVE  POCT PREGNANCY, URINE     Status: Abnormal   Collection Time    07/21/12  5:31 PM      Result Value Range   Preg Test, Ur POSITIVE (*) NEGATIVE   Results for orders placed during the hospital encounter of 07/21/12 (from the past 24  hour(s))  URINALYSIS, ROUTINE W REFLEX MICROSCOPIC     Status: None   Collection Time    07/21/12  5:15 PM      Result Value Range   Color, Urine YELLOW  YELLOW   APPearance CLEAR  CLEAR   Specific Gravity, Urine 1.025  1.005 - 1.030   pH 6.0  5.0 - 8.0   Glucose, UA NEGATIVE  NEGATIVE mg/dL   Hgb urine dipstick NEGATIVE  NEGATIVE   Bilirubin Urine NEGATIVE  NEGATIVE   Ketones, ur NEGATIVE  NEGATIVE mg/dL   Protein, ur NEGATIVE  NEGATIVE mg/dL   Urobilinogen, UA 0.2  0.0 - 1.0 mg/dL   Nitrite NEGATIVE  NEGATIVE   Leukocytes, UA NEGATIVE  NEGATIVE  POCT PREGNANCY, URINE     Status: Abnormal   Collection Time    07/21/12  5:31 PM      Result Value Range   Preg Test, Ur POSITIVE (*) NEGATIVE  CBC     Status: Abnormal   Collection Time    07/21/12  6:05 PM      Result Value Range   WBC 8.3  4.0 - 10.5 K/uL   RBC 4.81  3.87 - 5.11 MIL/uL   Hemoglobin 11.7 (*) 12.0 - 15.0 g/dL   HCT 16.1  09.6 - 04.5 %   MCV 75.7 (*) 78.0 - 100.0 fL   MCH 24.3 (*) 26.0 - 34.0 pg   MCHC 32.1  30.0 - 36.0 g/dL   RDW 40.9  81.1 - 91.4 %   Platelets 232  150 - 400 K/uL  HCG, QUANTITATIVE, PREGNANCY     Status: Abnormal   Collection Time    07/21/12  6:05 PM      Result Value Range   hCG, Beta Chain, Quant, S 153 (*) <5 mIU/mL  ABO/RH     Status: None   Collection Time    07/21/12  6:05 PM      Result Value Range   ABO/RH(D) O POS    WET PREP, GENITAL     Status: Abnormal   Collection Time    07/21/12  6:15 PM      Result Value Range   Yeast Wet Prep HPF POC NONE SEEN  NONE SEEN   Trich, Wet Prep NONE SEEN  NONE SEEN   Clue Cells Wet Prep HPF POC NONE SEEN  NONE SEEN   WBC, Wet Prep HPF POC FEW (*) NONE SEEN   Results for orders placed during the hospital encounter of 07/21/12 (from the past 24 hour(s))  URINALYSIS, ROUTINE W REFLEX MICROSCOPIC     Status: None   Collection Time    07/21/12  5:15 PM      Result Value Range   Color, Urine YELLOW  YELLOW   APPearance CLEAR  CLEAR    Specific Gravity, Urine 1.025  1.005 - 1.030   pH 6.0  5.0 - 8.0   Glucose, UA NEGATIVE  NEGATIVE mg/dL   Hgb urine dipstick NEGATIVE  NEGATIVE   Bilirubin Urine NEGATIVE  NEGATIVE   Ketones, ur NEGATIVE  NEGATIVE mg/dL   Protein, ur NEGATIVE  NEGATIVE mg/dL   Urobilinogen, UA 0.2  0.0 - 1.0 mg/dL   Nitrite NEGATIVE  NEGATIVE   Leukocytes, UA NEGATIVE  NEGATIVE  POCT PREGNANCY, URINE     Status: Abnormal   Collection Time    07/21/12  5:31 PM      Result Value Range   Preg Test, Ur POSITIVE (*) NEGATIVE  CBC     Status: Abnormal   Collection Time    07/21/12  6:05 PM      Result Value Range   WBC 8.3  4.0 - 10.5 K/uL   RBC 4.81  3.87 - 5.11 MIL/uL   Hemoglobin 11.7 (*) 12.0 - 15.0 g/dL   HCT 82.9  56.2 - 13.0 %   MCV 75.7 (*) 78.0 - 100.0 fL   MCH 24.3 (*) 26.0 - 34.0 pg   MCHC 32.1  30.0 - 36.0 g/dL   RDW 86.5  78.4 - 69.6 %   Platelets 232  150 - 400 K/uL  HCG, QUANTITATIVE, PREGNANCY     Status: Abnormal   Collection Time    07/21/12  6:05 PM      Result Value Range   hCG, Beta Chain, Quant, S 153 (*) <5 mIU/mL  ABO/RH     Status: None   Collection Time    07/21/12  6:05 PM      Result Value Range   ABO/RH(D) O POS    WET PREP, GENITAL     Status: Abnormal   Collection Time    07/21/12  6:15 PM      Result Value Range   Yeast Wet Prep HPF POC NONE SEEN  NONE SEEN   Trich, Wet Prep NONE SEEN  NONE SEEN   Clue Cells Wet Prep HPF POC NONE SEEN  NONE SEEN   WBC, Wet Prep HPF POC FEW (*) NONE SEEN   RADIOLOGY REPORT*  Clinical Data: Left lower quadrant pain. Positive pregnancy test.  OBSTETRIC <14 WK [place] AND TRANSVAGINAL OB US  Technique: Both transabdominal and transvaginal ultrasound  examinations were performed for complete evaluation of the  gestation as well as the maternal uterus, adnexal regions, and  pelvic cul-de-sac. Transvaginal technique was performed to assess  early pregnancy.  Comparison: None.  Intrauterine gestational sac: None visualized  Yolk  sac: N/A  Embryo: N/A  Cardiac Activity: N/A  Maternal uterus/adnexae:  Uterus is homogeneous. No evidence for fluid in the endometrial  cavity. The maternal ovaries are normal in appearance bilaterally.  No evidence for adnexal mass. No free fluid in the adnexal regions  or cul-de-sac.  IMPRESSION:  No evidence for an intrauterine gestational sac. There is no  evidence for an adnexal mass. No fluid or hemorrhage in the  peritoneal cavity.  Given the history of a positive pregnancy test, differential  considerations for these findings include intrauterine gestation  too early to visualize, completed abortion, or nonvisualized  ectopic pregnancy. Close clinical correlation is recommended with  serial beta-hCG and followup ultrasound as warranted.  Original Report Authenticated By: Kennith Center, M.D.         Assessment and Plan  CBC, Quant HCG, ABO/Rh, Wet prep and GC pending  Awaiting u/s Care assumed by Pamelia Hoit, NP abd pain in pregnancy- f/u Thurs 3/6 for repeat HCG- ectopic precautions FRAZIER,NATALIE 07/21/2012, 5:48 PM

## 2012-07-21 NOTE — MAU Provider Note (Signed)
Chart reviewed and agree with management and plan.  

## 2012-07-22 LAB — GC/CHLAMYDIA PROBE AMP: GC Probe RNA: NEGATIVE

## 2012-07-24 ENCOUNTER — Inpatient Hospital Stay (HOSPITAL_COMMUNITY)
Admission: AD | Admit: 2012-07-24 | Discharge: 2012-07-24 | Disposition: A | Discharge status: Home / Self Care | Payer: BC Managed Care – PPO | Source: Ambulatory Visit | Attending: Obstetrics & Gynecology | Admitting: Obstetrics & Gynecology

## 2012-07-24 DIAGNOSIS — O99891 Other specified diseases and conditions complicating pregnancy: Secondary | ICD-10-CM | POA: Insufficient documentation

## 2012-07-24 DIAGNOSIS — O200 Threatened abortion: Secondary | ICD-10-CM

## 2012-07-24 DIAGNOSIS — R109 Unspecified abdominal pain: Secondary | ICD-10-CM | POA: Insufficient documentation

## 2012-07-24 LAB — HCG, QUANTITATIVE, PREGNANCY: hCG, Beta Chain, Quant, S: 592 m[IU]/mL — ABNORMAL HIGH (ref <5)

## 2012-07-24 NOTE — MAU Note (Signed)
Patient to MAU for repeat BHCG. Patient states she has mild cramping on both sides that is intermittent. Denies bleeding.

## 2012-07-24 NOTE — MAU Provider Note (Signed)
History     CSN: [id number]  Arrival date and time: 07/24/12 7829   None     Chief Complaint  Patient presents with  . Follow-up   HPI This is a 22 y.o. female at [redacted]w[redacted]d who presents for followup HCG.  She was seen two days ago for cramping and nothing was seen on [place]. Now has some cramping but no bleeding.   RN Note: Patient to MAU for repeat BHCG. Patient states she has mild cramping on both sides that is intermittent. Denies bleeding.       OB History   Grav Para Term Preterm Abortions TAB SAB Ect Mult Living   2    1  1          Past Medical History  Diagnosis Date  . No pertinent past medical history   . Migraine     Past Surgical History  Procedure Laterality Date  . Cholecystectomy      Family History  Problem Relation Age of Onset  . Anesthesia problems Neg Hx   . Diabetes Mother   . Hypertension Mother   . Hypertension Father   . Hypertension Maternal Grandmother   . Hypertension Maternal Grandfather   . Hypertension Paternal Grandmother   . Hypertension Paternal Grandfather     History  Substance Use Topics  . Smoking status: Never Smoker   . Smokeless tobacco: Not on file  . Alcohol Use: Yes     Comment: occasionally    Allergies: No Known Allergies  Prescriptions prior to admission  Medication Sig Dispense Refill  . cholecalciferol (VITAMIN D) 1000 UNITS tablet Take 1,000 Units by mouth daily.      Marland Kitchen HYDROcodone-acetaminophen (LORTAB) 7.5-500 MG/15ML solution Take 15 mLs by mouth every 4 (four) hours as needed. 15 ML by mouth every 4 hours when necessary cough      . promethazine (PHENERGAN) 25 MG tablet Take 25 mg by mouth every 6 (six) hours as needed. Nausea        Review of Systems  Constitutional: Negative for fever and chills.  Gastrointestinal: Positive for abdominal pain (cramping). Negative for nausea, vomiting, diarrhea and constipation.  Genitourinary: Negative for dysuria.  Neurological: Negative for dizziness and weakness.    Physical Exam   Blood pressure 121/82, pulse 85, temperature 98.5 F (36.9 C), temperature source Oral, resp. rate 20, last menstrual period 06/14/2012, SpO2 100.00%.  Physical Exam  Constitutional: She is oriented to person, place, and time. She appears well-developed and well-nourished. No distress.  Cardiovascular: Normal rate.   Respiratory: Effort normal.  GI: Soft. There is no tenderness.  Musculoskeletal: Normal range of motion.  Neurological: She is alert and oriented to person, place, and time.  Skin: Skin is warm and dry.  Psychiatric: She has a normal mood and affect.   Pelvic exam not indicated MAU Course  Procedures  MDM Results for orders placed during the hospital encounter of 07/24/12 (from the past 24 hour(s))  HCG, QUANTITATIVE, PREGNANCY     Status: Abnormal   Collection Time    07/24/12  7:57 AM      Result Value Range   hCG, Beta Chain, Quant, S 592 (*) <5 mIU/mL   Last Quant was 153 three days ago  Assessment and Plan  A:  Pregnancy at [redacted]w[redacted]d       Appropriate doubling of quant. HCG P:  Discharge home       Plan repeat US in a week 08/01/12  [facility] 07/24/2012, 9:25 AM

## 2012-07-28 NOTE — MAU Provider Note (Signed)
Attestation of Attending Supervision of Advanced Practitioner (CNM/NP): Evaluation and management procedures were performed by the Advanced Practitioner under my supervision and collaboration.  I have reviewed the Advanced Practitioner's note and chart, and I agree with the management and plan.  HARRAWAY-SMITH, Diane Mochizuki 10:52 AM     

## 2012-08-01 ENCOUNTER — Inpatient Hospital Stay (HOSPITAL_COMMUNITY)
Admission: AD | Admit: 2012-08-01 | Discharge: 2012-08-01 | Disposition: A | Discharge status: Home / Self Care | Payer: BC Managed Care – PPO | Source: Ambulatory Visit | Attending: Obstetrics & Gynecology | Admitting: Obstetrics & Gynecology

## 2012-08-01 ENCOUNTER — Ambulatory Visit (HOSPITAL_COMMUNITY)
Admit: 2012-08-01 | Discharge: 2012-08-01 | Disposition: A | Discharge status: Home / Self Care | Payer: BC Managed Care – PPO | Attending: Advanced Practice Midwife | Admitting: Advanced Practice Midwife

## 2012-08-01 ENCOUNTER — Encounter (HOSPITAL_COMMUNITY): Payer: Self-pay | Admitting: Advanced Practice Midwife

## 2012-08-01 ENCOUNTER — Ambulatory Visit (HOSPITAL_COMMUNITY): Payer: BC Managed Care – PPO

## 2012-08-01 DIAGNOSIS — O99891 Other specified diseases and conditions complicating pregnancy: Secondary | ICD-10-CM | POA: Insufficient documentation

## 2012-08-01 DIAGNOSIS — Z3490 Encounter for supervision of normal pregnancy, unspecified, unspecified trimester: Secondary | ICD-10-CM

## 2012-08-01 DIAGNOSIS — Z3689 Encounter for other specified antenatal screening: Secondary | ICD-10-CM | POA: Insufficient documentation

## 2012-08-01 DIAGNOSIS — R109 Unspecified abdominal pain: Secondary | ICD-10-CM | POA: Insufficient documentation

## 2012-08-01 DIAGNOSIS — O3680X0 Pregnancy with inconclusive fetal viability, not applicable or unspecified: Secondary | ICD-10-CM | POA: Insufficient documentation

## 2012-08-01 DIAGNOSIS — O200 Threatened abortion: Secondary | ICD-10-CM

## 2012-08-01 DIAGNOSIS — Z1389 Encounter for screening for other disorder: Secondary | ICD-10-CM

## 2012-08-01 NOTE — MAU Provider Note (Signed)
S: 22 y.o. G2P0010 @[redacted]w[redacted]d  presents to MAU today for f/u ultrasound. She had appropriate rise in quant hcg 1 week ago.  She denies abdominal pain, LOF, vaginal bleeding, vaginal itching/burning, urinary symptoms, h/a, dizziness, n/v, or fever/chills.    O: BP 133/76  Pulse 77  Temp(Src) 98.2 F (36.8 C) (Oral)  Resp 18  SpO2 100%  LMP 06/14/2012   A: 1. Normal IUP (intrauterine pregnancy) on prenatal ultrasound     P: D/C home Pt plans to f/u with prenatal care at [facility] Return to MAU as needed  Sharen Counter Certified Nurse-Midwife

## 2012-08-01 NOTE — MAU Note (Signed)
Patient to MAU after ultrasound for viability. Patient states she has some off and on sharp abdominal pain on both sides. Denies bleeding.

## 2012-08-15 LAB — OB RESULTS CONSOLE GC/CHLAMYDIA
Chlamydia: NEGATIVE
Gonorrhea: NEGATIVE

## 2012-08-15 LAB — OB RESULTS CONSOLE RPR: RPR: NONREACTIVE

## 2012-10-10 ENCOUNTER — Encounter (HOSPITAL_COMMUNITY): Payer: Self-pay | Admitting: *Deleted

## 2012-10-10 ENCOUNTER — Inpatient Hospital Stay (HOSPITAL_COMMUNITY)
Admission: AD | Admit: 2012-10-10 | Discharge: 2012-10-10 | Disposition: A | Discharge status: Home / Self Care | Payer: BC Managed Care – PPO | Source: Ambulatory Visit | Attending: Obstetrics and Gynecology | Admitting: Obstetrics and Gynecology

## 2012-10-10 DIAGNOSIS — K529 Noninfective gastroenteritis and colitis, unspecified: Secondary | ICD-10-CM

## 2012-10-10 DIAGNOSIS — O210 Mild hyperemesis gravidarum: Secondary | ICD-10-CM | POA: Insufficient documentation

## 2012-10-10 DIAGNOSIS — R51 Headache: Secondary | ICD-10-CM | POA: Insufficient documentation

## 2012-10-10 DIAGNOSIS — R1012 Left upper quadrant pain: Secondary | ICD-10-CM | POA: Insufficient documentation

## 2012-10-10 DIAGNOSIS — K5289 Other specified noninfective gastroenteritis and colitis: Secondary | ICD-10-CM

## 2012-10-10 DIAGNOSIS — A088 Other specified intestinal infections: Secondary | ICD-10-CM | POA: Insufficient documentation

## 2012-10-10 DIAGNOSIS — O99891 Other specified diseases and conditions complicating pregnancy: Secondary | ICD-10-CM | POA: Insufficient documentation

## 2012-10-10 LAB — URINALYSIS, ROUTINE W REFLEX MICROSCOPIC
Bilirubin Urine: NEGATIVE
Hgb urine dipstick: NEGATIVE
Protein, ur: NEGATIVE mg/dL
Urobilinogen, UA: 0.2 mg/dL (ref 0.0–1.0)

## 2012-10-10 MED ORDER — LACTATED RINGERS IV BOLUS (SEPSIS)
1000.0000 mL | Freq: Once | INTRAVENOUS | Status: AC
  Administered 2012-10-10: 1000 mL via INTRAVENOUS

## 2012-10-10 MED ORDER — ACETAMINOPHEN 500 MG PO TABS
1000.0000 mg | ORAL_TABLET | Freq: Once | ORAL | Status: AC
  Administered 2012-10-10: 1000 mg via ORAL
  Filled 2012-10-10: qty 2

## 2012-10-10 MED ORDER — ONDANSETRON HCL 4 MG/2ML IJ SOLN
4.0000 mg | Freq: Once | INTRAMUSCULAR | Status: AC
  Administered 2012-10-10: 4 mg via INTRAVENOUS
  Filled 2012-10-10: qty 2

## 2012-10-10 MED ORDER — ONDANSETRON HCL 4 MG PO TABS: 4.0000 mg | ORAL_TABLET | Freq: Four times a day (QID) | ORAL | Status: DC

## 2012-10-10 NOTE — MAU Note (Signed)
Pt states she has had a h/a since yesterday unrelieved by Tylenol last taken yesterday.  Pt has hx of H/A.  Vomiting started today.

## 2012-10-10 NOTE — MAU Provider Note (Signed)
History     CSN: [id number]  Arrival date and time: 10/10/12 1320   First Provider Initiated Contact with Patient 10/10/12 1351      Chief Complaint  Patient presents with  . Emesis  . Headache   HPI Ms. LAVENIA STUMPO is a 22 y.o. G2P0010 at [redacted]w[redacted]d who presents to MAU today with complaint of N/V and headache. She rates the headache at 10/10 now. She is also having LUQ abdominal pain. She states that the N/V started about 3 hours ago. She denies fever, diarrhea or sick contacts.    OB History   Grav Para Term Preterm Abortions TAB SAB Ect Mult Living   2    1  1    0      Past Medical History  Diagnosis Date  . No pertinent past medical history   . Migraine     Past Surgical History  Procedure Laterality Date  . Cholecystectomy      Family History  Problem Relation Age of Onset  . Anesthesia problems Neg Hx   . Diabetes Mother   . Hypertension Mother   . Hypertension Father   . Hypertension Maternal Grandmother   . Hypertension Maternal Grandfather   . Hypertension Paternal Grandmother   . Hypertension Paternal Grandfather     History  Substance Use Topics  . Smoking status: Never Smoker   . Smokeless tobacco: Not on file  . Alcohol Use: No     Comment: occasionally    Allergies: No Known Allergies  Prescriptions prior to admission  Medication Sig Dispense Refill  . Hydrocodone-Acetaminophen (VICODIN PO) Take 1 tablet by mouth as needed (pain).        Review of Systems  Constitutional: Negative for fever, chills and malaise/fatigue.  Gastrointestinal: Positive for nausea, vomiting and abdominal pain. Negative for diarrhea and constipation.  Genitourinary: Negative for dysuria, urgency and frequency.       Neg - vaginal bleeding, discharge, LOF  Neurological: Negative for dizziness and loss of consciousness.   Physical Exam   Blood pressure 140/70, pulse 94, temperature 98.3 F (36.8 C), temperature source Oral, height 5' 5.5" (1.664 m),  weight 302 lb 9.6 oz (137.258 kg), last menstrual period 06/14/2012, SpO2 100.00%.  Physical Exam  Constitutional: She is oriented to person, place, and time. She appears well-developed and well-nourished. No distress.  HENT:  Head: Normocephalic and atraumatic.  Cardiovascular: Normal rate, regular rhythm and normal heart sounds.   Respiratory: Effort normal and breath sounds normal. No respiratory distress.  GI: Soft. Bowel sounds are normal. She exhibits no distension and no mass. There is tenderness (mild tenderness to palpation of the LUQ and left mid-abdomen). There is no rebound and no guarding.  Neurological: She is alert and oriented to person, place, and time.  Skin: Skin is warm and dry. No erythema.  Psychiatric: She has a normal mood and affect.   Results for orders placed during the hospital encounter of 10/10/12 (from the past 24 hour(s))  URINALYSIS, ROUTINE W REFLEX MICROSCOPIC     Status: Abnormal   Collection Time    10/10/12  1:40 PM      Result Value Range   Color, Urine YELLOW  YELLOW   APPearance CLEAR  CLEAR   Specific Gravity, Urine 1.015  1.005 - 1.030   pH 6.5  5.0 - 8.0   Glucose, UA NEGATIVE  NEGATIVE mg/dL   Hgb urine dipstick NEGATIVE  NEGATIVE   Bilirubin Urine NEGATIVE  NEGATIVE  Ketones, ur 15 (*) NEGATIVE mg/dL   Protein, ur NEGATIVE  NEGATIVE mg/dL   Urobilinogen, UA 0.2  0.0 - 1.0 mg/dL   Nitrite NEGATIVE  NEGATIVE   Leukocytes, UA NEGATIVE  NEGATIVE    MAU Course  Procedures None  MDM Discussed with Dr. Arelia Sneddon. 1 L IV LR with 4 mg IV Zofran. Reassess patient after adequate hydration. Ok to discharge with Rx for Zofran if needed.  Patient reports improvement in N/V and abdominal pain. Some improvement in headache. 1000 mg tylenol given in MAU.   Assessment and Plan  A: Acute viral gastroenteritis  P: Discharge home Rx for Zofran sent to patient's pharmacy Patient encouraged to keep follow-up in the office as scheduled Patient may  return to MAU as needed  Freddi Starr, PA-C  10/10/2012, 4:28 PM

## 2012-10-10 NOTE — MAU Note (Signed)
Patient states she has been vomiting for the past 2 hours green liquid with some blood in it. Patient states she started having sharp left abdominal pain on arrival to MAU. Denies bleeding or discharge.

## 2012-12-03 ENCOUNTER — Inpatient Hospital Stay (HOSPITAL_COMMUNITY)
Admission: AD | Admit: 2012-12-03 | Discharge: 2012-12-03 | Disposition: A | Discharge status: Home / Self Care | Payer: BC Managed Care – PPO | Source: Ambulatory Visit | Attending: Obstetrics and Gynecology | Admitting: Obstetrics and Gynecology

## 2012-12-03 ENCOUNTER — Encounter (HOSPITAL_COMMUNITY): Payer: Self-pay

## 2012-12-03 DIAGNOSIS — Y92009 Unspecified place in unspecified non-institutional (private) residence as the place of occurrence of the external cause: Secondary | ICD-10-CM | POA: Insufficient documentation

## 2012-12-03 DIAGNOSIS — W2203XA Walked into furniture, initial encounter: Secondary | ICD-10-CM | POA: Insufficient documentation

## 2012-12-03 DIAGNOSIS — S3991XA Unspecified injury of abdomen, initial encounter: Secondary | ICD-10-CM

## 2012-12-03 DIAGNOSIS — O368190 Decreased fetal movements, unspecified trimester, not applicable or unspecified: Secondary | ICD-10-CM | POA: Insufficient documentation

## 2012-12-03 LAB — URINALYSIS, ROUTINE W REFLEX MICROSCOPIC
Glucose, UA: NEGATIVE mg/dL
Hgb urine dipstick: NEGATIVE
Ketones, ur: NEGATIVE mg/dL
Protein, ur: NEGATIVE mg/dL

## 2012-12-03 NOTE — MAU Note (Signed)
Pt states around 12 yesterday, was working and jumped up from her desk, hitting mid abdomen. Hasn't felt fetal movement since then. Has been having menstrual cramps past few weeks as well.

## 2012-12-03 NOTE — MAU Provider Note (Signed)
History     CSN: [id number]  Arrival date and time: 12/03/12 4098   First Provider Initiated Contact with Patient 12/03/12 [phone]      Chief Complaint  Patient presents with  . Decreased Fetal Movement   HPI Ms. Rose Manning is a 22 y.o. G2P0010 at [redacted]w[redacted]d who presents to MAU today with no FM since noon yesterday. The patient states that she hit her abdomen on her desk when she was standing up and since then has felt no FM. She states that she felt regular FM prior to the accident. She is also having some mild lower abdominal cramping x 1 week. She denies bleeding, discharge, LOF.   OB History   Grav Para Term Preterm Abortions TAB SAB Ect Mult Living   2    1  1    0      Past Medical History  Diagnosis Date  . No pertinent past medical history   . Migraine     Past Surgical History  Procedure Laterality Date  . Cholecystectomy    . Wisdom tooth extraction      Family History  Problem Relation Age of Onset  . Anesthesia problems Neg Hx   . Diabetes Mother   . Hypertension Mother   . Hypertension Father   . Hypertension Maternal Grandmother   . Hypertension Maternal Grandfather   . Hypertension Paternal Grandmother   . Hypertension Paternal Grandfather     History  Substance Use Topics  . Smoking status: Never Smoker   . Smokeless tobacco: Never Used  . Alcohol Use: No     Comment: occasionally    Allergies: No Known Allergies  Prescriptions prior to admission  Medication Sig Dispense Refill  . acetaminophen (TYLENOL) 500 MG tablet Take 1,000 mg by mouth every 6 (six) hours as needed for pain.      . Prenatal Vit-Fe Fumarate-FA (PRENATAL MULTIVITAMIN) TABS Take 1 tablet by mouth daily at 12 noon.        Review of Systems  Gastrointestinal: Positive for abdominal pain.  Genitourinary:       Neg - vaginal bleeding, discharge, LOF   Physical Exam   Blood pressure 112/74, pulse 96, temperature 98.3 F (36.8 C), temperature source Oral, resp. rate  16, height 5\' 6"  (1.676 m), weight 314 lb (142.429 kg), last menstrual period 06/14/2012.  Physical Exam  Constitutional: She is oriented to person, place, and time. She appears well-developed and well-nourished. No distress.  HENT:  Head: Normocephalic and atraumatic.  Cardiovascular: Normal rate, regular rhythm and normal heart sounds.   Respiratory: Effort normal and breath sounds normal. No respiratory distress.  GI: Soft. Bowel sounds are normal. She exhibits no distension and no mass. There is tenderness (mild tenderness of the upper abdomen). There is no rebound and no guarding.  Neurological: She is alert and oriented to person, place, and time.  Skin: Skin is warm and dry. No erythema.  Psychiatric: She has a normal mood and affect.   Results for orders placed during the hospital encounter of 12/03/12 (from the past 24 hour(s))  URINALYSIS, ROUTINE W REFLEX MICROSCOPIC     Status: None   Collection Time    12/03/12  8:45 AM      Result Value Range   Color, Urine YELLOW  YELLOW   APPearance CLEAR  CLEAR   Specific Gravity, Urine 1.015  1.005 - 1.030   pH 7.0  5.0 - 8.0   Glucose, UA NEGATIVE  NEGATIVE mg/dL  Hgb urine dipstick NEGATIVE  NEGATIVE   Bilirubin Urine NEGATIVE  NEGATIVE   Ketones, ur NEGATIVE  NEGATIVE mg/dL   Protein, ur NEGATIVE  NEGATIVE mg/dL   Urobilinogen, UA 0.2  0.0 - 1.0 mg/dL   Nitrite NEGATIVE  NEGATIVE   Leukocytes, UA NEGATIVE  NEGATIVE   Fetal Monitoring: Baseline: 145 bpm, moderate variability, + accelerations, no decelerations Contractions: none  MAU Course  Procedures None  MDM Discussed with Dr. Renaldo Fiddler. Reassure patient of +FHR and +FM. Follow-up in the office as scheduled  Assessment and Plan  A: Abdominal trauma in pregnancy, second trimester  P: Discharge home Kick counts discussed and information on AVS Patient advised to follow-up in the office as scheduled or sooner if symptoms persist Patient may return to MAU as needed  or if her condition were to change or worsen  Freddi Starr, PA-C  12/03/2012, 9:41 AM

## 2012-12-03 NOTE — MAU Note (Signed)
Patient states she has not felt fetal movement since she hit her abdomen on her desk yesterday 7-15 about 1200. Having some abdominal pain. No bleeding.

## 2013-01-05 ENCOUNTER — Encounter (HOSPITAL_COMMUNITY): Payer: Self-pay | Admitting: *Deleted

## 2013-01-05 ENCOUNTER — Inpatient Hospital Stay (HOSPITAL_COMMUNITY)
Admission: AD | Admit: 2013-01-05 | Discharge: 2013-01-05 | Disposition: A | Discharge status: Home / Self Care | Payer: BC Managed Care – PPO | Source: Ambulatory Visit | Attending: Obstetrics and Gynecology | Admitting: Obstetrics and Gynecology

## 2013-01-05 DIAGNOSIS — Z3689 Encounter for other specified antenatal screening: Secondary | ICD-10-CM

## 2013-01-05 DIAGNOSIS — R55 Syncope and collapse: Secondary | ICD-10-CM

## 2013-01-05 DIAGNOSIS — R109 Unspecified abdominal pain: Secondary | ICD-10-CM | POA: Insufficient documentation

## 2013-01-05 DIAGNOSIS — O368190 Decreased fetal movements, unspecified trimester, not applicable or unspecified: Secondary | ICD-10-CM | POA: Insufficient documentation

## 2013-01-05 DIAGNOSIS — O2650 Maternal hypotension syndrome, unspecified trimester: Secondary | ICD-10-CM | POA: Insufficient documentation

## 2013-01-05 DIAGNOSIS — E162 Hypoglycemia, unspecified: Secondary | ICD-10-CM | POA: Insufficient documentation

## 2013-01-05 LAB — CBC
HCT: 32.5 % — ABNORMAL LOW (ref 36.0–46.0)
Hemoglobin: 10.7 g/dL — ABNORMAL LOW (ref 12.0–15.0)
MCH: 25.7 pg — ABNORMAL LOW (ref 26.0–34.0)
MCHC: 32.9 g/dL (ref 30.0–36.0)
MCV: 78.1 fL (ref 78.0–100.0)
Platelets: 214 K/uL (ref 150–400)
RBC: 4.16 MIL/uL (ref 3.87–5.11)
RDW: 15.2 % (ref 11.5–15.5)
WBC: 11.7 K/uL — ABNORMAL HIGH (ref 4.0–10.5)

## 2013-01-05 LAB — COMPREHENSIVE METABOLIC PANEL
ALT: 29 U/L (ref 0–35)
AST: 15 U/L (ref 0–37)
Alkaline Phosphatase: 136 U/L — ABNORMAL HIGH (ref 39–117)
CO2: 23 mEq/L (ref 19–32)
Chloride: 102 mEq/L (ref 96–112)
GFR calc non Af Amer: >90 mL/min (ref >90)
Potassium: 4.1 mEq/L (ref 3.5–5.1)
Sodium: 134 mEq/L — ABNORMAL LOW (ref 135–145)
Total Bilirubin: 0.2 mg/dL — ABNORMAL LOW (ref 0.3–1.2)

## 2013-01-05 LAB — URINALYSIS, ROUTINE W REFLEX MICROSCOPIC
Bilirubin Urine: NEGATIVE
Glucose, UA: NEGATIVE mg/dL
Hgb urine dipstick: NEGATIVE
Ketones, ur: NEGATIVE mg/dL
pH: 7.5 (ref 5.0–8.0)

## 2013-01-05 MED ORDER — DEXTROSE 5 % IN LACTATED RINGERS IV BOLUS
1000.0000 mL | Freq: Once | INTRAVENOUS | Status: DC
Start: 2013-01-05 — End: 2013-01-05

## 2013-01-05 NOTE — MAU Provider Note (Signed)
Chief Complaint:  Emesis, Decreased Fetal Movement and Abdominal Pain   First Provider Initiated Contact with Patient 01/05/13 1402      HPI: Rose Manning is a 22 y.o. G2P0010 at 61w0dwho presents to maternity admissions reporting an episode this morning of vomiting x5-6 after breakfast, then standing in her bathroom she remembers her vision going black, then woke up on the floor.  She does not think she hit her abdomen and landed on her side.  She has no other injuries to report.  Currently, while in MAU, she reports dizziness and feeling like her skin is hot and sweaty, and her clothes feel wet with sweat.  She also reports a throbbing sensation in her vagina with onset this afternoon.  She denies known exposure to illness.  She reports good fetal movement, denies LOF, vaginal bleeding, vaginal itching/burning, urinary symptoms, h/a.       Past Medical History: Past Medical History  Diagnosis Date  . No pertinent past medical history   . Migraine     Past obstetric history: OB History  Gravida Para Term Preterm AB SAB TAB Ectopic Multiple Living  2    1 1     0    # Outcome Date GA Lbr Len/2nd Weight Sex Delivery Anes PTL Lv  2 CUR           1 SAB               Past Surgical History: Past Surgical History  Procedure Laterality Date  . Cholecystectomy    . Wisdom tooth extraction      Family History: Family History  Problem Relation Age of Onset  . Anesthesia problems Neg Hx   . Diabetes Mother   . Hypertension Mother   . Hypertension Father   . Hypertension Maternal Grandmother   . Hypertension Maternal Grandfather   . Hypertension Paternal Grandmother   . Hypertension Paternal Grandfather     Social History: History  Substance Use Topics  . Smoking status: Never Smoker   . Smokeless tobacco: Never Used  . Alcohol Use: No     Comment: occasionally    Allergies: No Known Allergies  Meds:  No prescriptions prior to admission    ROS: Pertinent findings  in history of present illness.  Physical Exam  Blood pressure 131/61, pulse 95, temperature 97.9 F (36.6 C), temperature source Oral, resp. rate 18, height 5\' 5"  (1.651 m), weight 143.246 kg (315 lb 12.8 oz), last menstrual period 06/14/2012, SpO2 98.00%. GENERAL: Well-developed, well-nourished female in no acute distress.  HEENT: normocephalic HEART: normal rate RESP: normal effort ABDOMEN: Soft, non-tender, gravid appropriate for gestational age EXTREMITIES: Nontender, no edema NEURO: alert and oriented SPECULUM EXAM:     FHT:  Baseline 135 , moderate variability, accelerations present, no decelerations Contractions: None on toco or to palpation   EKG:  NSR  Labs: Results for orders placed during the hospital encounter of 01/05/13 (from the past 168 hour(s))  URINALYSIS, ROUTINE W REFLEX MICROSCOPIC   Collection Time    01/05/13  1:05 PM      Result Value Range   Color, Urine YELLOW  YELLOW   APPearance CLEAR  CLEAR   Specific Gravity, Urine 1.010  1.005 - 1.030   pH 7.5  5.0 - 8.0   Glucose, UA NEGATIVE  NEGATIVE mg/dL   Hgb urine dipstick NEGATIVE  NEGATIVE   Bilirubin Urine NEGATIVE  NEGATIVE   Ketones, ur NEGATIVE  NEGATIVE mg/dL   Protein, ur  NEGATIVE  NEGATIVE mg/dL   Urobilinogen, UA 0.2  0.0 - 1.0 mg/dL   Nitrite NEGATIVE  NEGATIVE   Leukocytes, UA NEGATIVE  NEGATIVE  CBC   Collection Time    01/05/13  2:07 PM      Result Value Range   WBC 11.7 (*) 4.0 - 10.5 K/uL   RBC 4.16  3.87 - 5.11 MIL/uL   Hemoglobin 10.7 (*) 12.0 - 15.0 g/dL   HCT 16.1 (*) 09.6 - 04.5 %   MCV 78.1  78.0 - 100.0 fL   MCH 25.7 (*) 26.0 - 34.0 pg   MCHC 32.9  30.0 - 36.0 g/dL   RDW 40.9  81.1 - 91.4 %   Platelets 214  150 - 400 K/uL  COMPREHENSIVE METABOLIC PANEL   Collection Time    01/05/13  2:07 PM      Result Value Range   Sodium 134 (*) 135 - 145 mEq/L   Potassium 4.1  3.5 - 5.1 mEq/L   Chloride 102  96 - 112 mEq/L   CO2 23  19 - 32 mEq/L   Glucose, Bld 67 (*) 70 - 99  mg/dL   BUN 6  6 - 23 mg/dL   Creatinine, Ser 7.82  0.50 - 1.10 mg/dL   Calcium 9.2  8.4 - 95.6 mg/dL   Total Protein 6.6  6.0 - 8.3 g/dL   Albumin 2.4 (*) 3.5 - 5.2 g/dL   AST 15  0 - 37 U/L   ALT 29  0 - 35 U/L   Alkaline Phosphatase 136 (*) 39 - 117 U/L   Total Bilirubin 0.2 (*) 0.3 - 1.2 mg/dL   GFR calc non Af Amer >90  >90 mL/min   GFR calc Af Amer >90  >90 mL/min    Assessment: 1. Syncope   2. Hypoglycemia   3. NST (non-stress test) reactive     Plan: Consult with Dr Vincente Poli D5LR ordered--pt declined Pt given food and PO fluids in MAU with complete relief of symptoms D/C home F/U with Dr Arelia Sneddon and glucose testing as scheduled in office on Friday Return to MAU as needed       Follow-up Information   Follow up with Juluis Mire, MD. (Keep scheduled appointment on Friday. Return to MAU as needed.)    Specialty:  Obstetrics and Gynecology   Contact information:   [address], [address] [place], [address] [place] [address] [phone]        Medication List         acetaminophen 500 MG tablet  Commonly known as:  TYLENOL  Take 1,000 mg by mouth every 6 (six) hours as needed for pain.     prenatal multivitamin Tabs tablet  Take 1 tablet by mouth daily at 12 noon.        Sharen Counter Certified Nurse-Midwife 01/08/2013 6:34 AM

## 2013-01-05 NOTE — MAU Note (Signed)
Patient states she had diarrhea yesterday, none today, and had about 6 episodes of vomiting. Was in the bathroom about 1130 with vomiting then the next thing she woke up on the floor about 1200. Not sure if she fell or went to sleep on the floor. Sates she is having slight lower abdominal cramping and a throbbing sensation in the vaginal area. Denies bleeding or leaking.

## 2013-02-16 ENCOUNTER — Encounter (HOSPITAL_COMMUNITY): Payer: Self-pay | Admitting: *Deleted

## 2013-02-16 ENCOUNTER — Inpatient Hospital Stay (HOSPITAL_COMMUNITY)
Admission: AD | Admit: 2013-02-16 | Discharge: 2013-02-16 | Disposition: A | Discharge status: Home / Self Care | Payer: BC Managed Care – PPO | Source: Ambulatory Visit | Attending: Obstetrics and Gynecology | Admitting: Obstetrics and Gynecology

## 2013-02-16 ENCOUNTER — Inpatient Hospital Stay (HOSPITAL_COMMUNITY): Payer: BC Managed Care – PPO

## 2013-02-16 DIAGNOSIS — N949 Unspecified condition associated with female genital organs and menstrual cycle: Secondary | ICD-10-CM

## 2013-02-16 DIAGNOSIS — R109 Unspecified abdominal pain: Secondary | ICD-10-CM | POA: Insufficient documentation

## 2013-02-16 DIAGNOSIS — O99891 Other specified diseases and conditions complicating pregnancy: Secondary | ICD-10-CM | POA: Insufficient documentation

## 2013-02-16 LAB — URINALYSIS, ROUTINE W REFLEX MICROSCOPIC
Bilirubin Urine: NEGATIVE
Hgb urine dipstick: NEGATIVE
Specific Gravity, Urine: 1.01 (ref 1.005–1.030)
Urobilinogen, UA: 0.2 mg/dL (ref 0.0–1.0)

## 2013-02-16 LAB — URINE MICROSCOPIC-ADD ON

## 2013-02-16 MED ORDER — OXYCODONE-ACETAMINOPHEN 5-325 MG PO TABS: 2.0000 | ORAL_TABLET | ORAL | Status: DC | PRN

## 2013-02-16 NOTE — MAU Provider Note (Signed)
History     CSN: [id number]  Arrival date and time: 02/16/13 1046   First Provider Initiated Contact with Patient 02/16/13 1209      Chief Complaint  Patient presents with  . Abdominal Pain   HPI Comments: Rose Manning 22 y.o. G2P0010 presents to MAU with left lower abdominal pains that have been ongoing for the last 3 days. Rose Manning has not called the office. Rose Manning denies any bleeding, vaginal discharge,contractions, leaking of fluids.  Rose Manning has good fetal movement.      Patient is a 22 y.o. female presenting with abdominal pain.  Abdominal Pain The primary symptoms of the illness include abdominal pain.      Past Medical History  Diagnosis Date  . No pertinent past medical history   . Migraine     Past Surgical History  Procedure Laterality Date  . Cholecystectomy    . Wisdom tooth extraction      Family History  Problem Relation Age of Onset  . Anesthesia problems Neg Hx   . Diabetes Mother   . Hypertension Mother   . Hypertension Father   . Hypertension Maternal Grandmother   . Hypertension Maternal Grandfather   . Hypertension Paternal Grandmother   . Hypertension Paternal Grandfather     History  Substance Use Topics  . Smoking status: Never Smoker   . Smokeless tobacco: Never Used  . Alcohol Use: No     Comment: occasionally    Allergies: No Known Allergies  Prescriptions prior to admission  Medication Sig Dispense Refill  . acetaminophen (TYLENOL) 500 MG tablet Take 1,000 mg by mouth every 6 (six) hours as needed for pain.      . Prenatal Vit-Fe Fumarate-FA (PRENATAL MULTIVITAMIN) TABS Take 1 tablet by mouth daily at 12 noon.        Review of Systems  Constitutional: Negative.   HENT: Negative.   Eyes: Negative.   Respiratory: Negative.   Cardiovascular: Negative.   Gastrointestinal: Positive for abdominal pain.  Genitourinary: Negative.        " wets herself "  Musculoskeletal: Negative.   Skin: Negative.   Neurological: Negative.     Psychiatric/Behavioral: Negative.    Physical Exam   Blood pressure 140/84, pulse 101, temperature 98.1 F (36.7 C), temperature source Oral, resp. rate 20, height 5' 4.5" (1.638 m), weight 328 lb 3.2 oz (148.871 kg), last menstrual period 06/14/2012, SpO2 98.00%.  Physical Exam  Constitutional: Rose Manning is oriented to person, place, and time. Rose Manning appears well-developed and well-nourished.  HENT:  Head: Normocephalic and atraumatic.  Eyes: Pupils are equal, round, and reactive to light.  Cardiovascular: Normal rate, regular rhythm and normal heart sounds.   Respiratory: Effort normal and breath sounds normal.  GI: Soft. Bowel sounds are normal. There is tenderness.  Left lower tenderness  Genitourinary:  Not examined  Musculoskeletal: Normal range of motion.  Neurological: Rose Manning is oriented to person, place, and time.  Skin: Skin is warm and dry.  Psychiatric: Rose Manning has a normal mood and affect. Her behavior is normal. Judgment normal.   Results for orders placed during the hospital encounter of 02/16/13 (from the past 24 hour(s))  URINALYSIS, ROUTINE W REFLEX MICROSCOPIC     Status: Abnormal   Collection Time    02/16/13 11:33 AM      Result Value Range   Color, Urine YELLOW  YELLOW   APPearance CLEAR  CLEAR   Specific Gravity, Urine 1.010  1.005 - 1.030   pH 7.0  5.0 - 8.0   Glucose, UA NEGATIVE  NEGATIVE mg/dL   Hgb urine dipstick NEGATIVE  NEGATIVE   Bilirubin Urine NEGATIVE  NEGATIVE   Ketones, ur NEGATIVE  NEGATIVE mg/dL   Protein, ur NEGATIVE  NEGATIVE mg/dL   Urobilinogen, UA 0.2  0.0 - 1.0 mg/dL   Nitrite NEGATIVE  NEGATIVE   Leukocytes, UA MODERATE (*) NEGATIVE  URINE MICROSCOPIC-ADD ON     Status: Abnormal   Collection Time    02/16/13 11:33 AM      Result Value Range   Squamous Epithelial / LPF RARE  RARE   WBC, UA 3-6  <3 WBC/hpf   Bacteria, UA FEW (*) RARE     MAU Course  Procedures  MDM U/S Called Dr Renaldo Fiddler who is aware of situation and agrees to send pt  home with pain meds/ advise on round lig pains  Assessment and Plan   A: Round ligament pain likely P: Percecet prn pain # 10 Maternity belt, water therapy, rest  Carolynn Serve 02/16/2013, 2:40 PM

## 2013-02-16 NOTE — MAU Note (Signed)
Patient states she has been having pain in the left lower abdomen for about 3 days but has gotten worse. Denies bleeding, discharge, nausea or vomiting. Reports good fetal movement.

## 2013-02-17 LAB — URINE CULTURE
Colony Count: NO GROWTH
Culture: NO GROWTH

## 2013-02-26 LAB — OB RESULTS CONSOLE GBS: GBS: NEGATIVE

## 2013-03-08 ENCOUNTER — Encounter (HOSPITAL_COMMUNITY): Payer: Self-pay | Admitting: *Deleted

## 2013-03-08 ENCOUNTER — Inpatient Hospital Stay (HOSPITAL_COMMUNITY)
Admission: AD | Admit: 2013-03-08 | Discharge: 2013-03-08 | Disposition: A | Discharge status: Home / Self Care | Payer: BC Managed Care – PPO | Source: Ambulatory Visit | Attending: Obstetrics and Gynecology | Admitting: Obstetrics and Gynecology

## 2013-03-08 DIAGNOSIS — M545 Low back pain, unspecified: Secondary | ICD-10-CM | POA: Insufficient documentation

## 2013-03-08 DIAGNOSIS — O4700 False labor before 37 completed weeks of gestation, unspecified trimester: Secondary | ICD-10-CM | POA: Insufficient documentation

## 2013-03-08 DIAGNOSIS — O99891 Other specified diseases and conditions complicating pregnancy: Secondary | ICD-10-CM | POA: Insufficient documentation

## 2013-03-08 DIAGNOSIS — M549 Dorsalgia, unspecified: Secondary | ICD-10-CM

## 2013-03-08 DIAGNOSIS — O9989 Other specified diseases and conditions complicating pregnancy, childbirth and the puerperium: Secondary | ICD-10-CM

## 2013-03-08 MED ORDER — CYCLOBENZAPRINE HCL 10 MG PO TABS
10.0000 mg | ORAL_TABLET | Freq: Once | ORAL | Status: AC
  Administered 2013-03-08: 10 mg via ORAL
  Filled 2013-03-08: qty 1

## 2013-03-08 MED ORDER — CYCLOBENZAPRINE HCL 10 MG PO TABS: 10.0000 mg | ORAL_TABLET | Freq: Three times a day (TID) | ORAL | Status: DC | PRN

## 2013-03-08 NOTE — MAU Note (Signed)
Pt. States that she started having sharp back pain this a.m. That went away and then came back again about dinner time that lasted for 15 minutes.  It has now come back and is constant  Sharp lower back pain.

## 2013-03-08 NOTE — MAU Provider Note (Signed)
Chief Complaint:  Back Pain and Contractions   First Provider Initiated Contact with Patient 03/08/13 2235     HPI: Rose Manning is a 22 y.o. G2P0010 at [redacted]w[redacted]d who presents to maternity admissions reporting sharp, bilateral low back pain since this morning. Initially intermittent, but now constant. Rates pain 9/10 on pain scale. Has not tried anything for the pain. Pain improved slightly when patient stands and leans forward. Denies injury, fever, chills, vaginal bleeding, leaking fluid, vaginal discharge, urinary complaints, GI complaints. Reports rare, mild contractions and occasional numbness in feet bilaterally when sitting up. Good fetal movement.   Past Medical History: Past Medical History  Diagnosis Date  . No pertinent past medical history   . Migraine     Past obstetric history: OB History  Gravida Para Term Preterm AB SAB TAB Ectopic Multiple Living  2    1 1     0    # Outcome Date GA Lbr Len/2nd Weight Sex Delivery Anes PTL Lv  2 CUR           1 SAB               Past Surgical History: Past Surgical History  Procedure Laterality Date  . Cholecystectomy    . Wisdom tooth extraction       Family History: Family History  Problem Relation Age of Onset  . Anesthesia problems Neg Hx   . Diabetes Mother   . Hypertension Mother   . Hypertension Father   . Hypertension Maternal Grandmother   . Hypertension Maternal Grandfather   . Hypertension Paternal Grandmother   . Hypertension Paternal Grandfather     Social History: History  Substance Use Topics  . Smoking status: Never Smoker   . Smokeless tobacco: Never Used  . Alcohol Use: No     Comment: occasionally    Allergies: No Known Allergies  Meds:  Prescriptions prior to admission  Medication Sig Dispense Refill  . Prenatal Vit-Fe Fumarate-FA (PRENATAL MULTIVITAMIN) TABS Take 1 tablet by mouth daily at 12 noon.      Marland Kitchen acetaminophen (TYLENOL) 500 MG tablet Take 1,000 mg by mouth every 6 (six) hours  as needed for pain.      Marland Kitchen oxyCODONE-acetaminophen (PERCOCET/ROXICET) 5-325 MG per tablet Take 2 tablets by mouth every 4 (four) hours as needed for pain.  10 tablet  0    ROS: Pertinent findings in history of present illness.  Physical Exam  Blood pressure 124/64, pulse 81, temperature 98.8 F (37.1 C), temperature source Oral, resp. rate 20, height 5\' 5"  (1.651 m), weight 151.229 kg (333 lb 6.4 oz), last menstrual period 06/14/2012. GENERAL: Well-developed, well-nourished, morbidly obese female in mild distress with movement.  HEENT: normocephalic HEART: normal rate RESP: normal effort ABDOMEN: Soft, non-tender, gravid appropriate for gestational age. BACK: Moderate tenderness across the entire low back. Normal range of motion. No CVA tenderness. Normal sensation in feet. EXTREMITIES: Nontender, no edema NEURO: alert and oriented SPECULUM EXAM: NEFG, physiologic discharge, no blood, cervix clean Dilation: Closed Effacement (%): 50 Cervical Position: Middle Exam by:: DHarris RN  FHT:  Baseline 120 , moderate variability, accelerations present, no decelerations Contractions: rare, mild   Labs: No results found for this or any previous visit (from the past 24 hour(s)).  Imaging:  N/A  MAU Course: Flexeril and warm compress given. Patient requesting discharge without waiting to see if interventions work.  Assessment: 1. Back pain complicating pregnancy in third trimester    Plan: Discharge home  in stable condition. Comfort measures. If your back pain continues, discuss physical therapy, massage or chiropractic therapy for pain relief with 0 provider at next visit. Try comfort measures first for pain relief. Use Flexeril sparingly. Labor precautions and fetal kick counts     Follow-up Information   Follow up with [facility] (This week as scheduled or as needed if symptoms worsen)    Contact information:   [address] [place] [address] [phone]      Follow up with [facility]. (As needed in the emergencies.)    Contact information:   [address] [id number] [place] [address] [phone]       Medication List         acetaminophen 500 MG tablet  Commonly known as:  TYLENOL  Take 1,000 mg by mouth every 6 (six) hours as needed for pain.     cyclobenzaprine 10 MG tablet  Commonly known as:  FLEXERIL  Take 1 tablet (10 mg total) by mouth 3 (three) times daily as needed for muscle spasms.     oxyCODONE-acetaminophen 5-325 MG per tablet  Commonly known as:  PERCOCET/ROXICET  Take 2 tablets by mouth every 4 (four) hours as needed for pain.     prenatal multivitamin Tabs tablet  Take 1 tablet by mouth daily at 12 noon.        McCarr, CNM 03/08/2013 11:19 PM

## 2013-03-08 NOTE — MAU Note (Signed)
Pt states has been have low back pain that is intermittent since 4pm today; unsure if these are contractions. Denies LOF or VB. Positive fetal movement. Was checked in office on Tuesday, cervix closed.

## 2013-03-16 ENCOUNTER — Inpatient Hospital Stay (HOSPITAL_COMMUNITY)
Admission: AD | Admit: 2013-03-16 | Discharge: 2013-03-16 | Disposition: A | Discharge status: Home / Self Care | Payer: BC Managed Care – PPO | Source: Ambulatory Visit | Attending: Obstetrics and Gynecology | Admitting: Obstetrics and Gynecology

## 2013-03-16 ENCOUNTER — Encounter (HOSPITAL_COMMUNITY): Payer: Self-pay | Admitting: *Deleted

## 2013-03-16 ENCOUNTER — Inpatient Hospital Stay (HOSPITAL_COMMUNITY): Payer: BC Managed Care – PPO

## 2013-03-16 DIAGNOSIS — R030 Elevated blood-pressure reading, without diagnosis of hypertension: Secondary | ICD-10-CM | POA: Insufficient documentation

## 2013-03-16 DIAGNOSIS — O99891 Other specified diseases and conditions complicating pregnancy: Secondary | ICD-10-CM | POA: Insufficient documentation

## 2013-03-16 DIAGNOSIS — O212 Late vomiting of pregnancy: Secondary | ICD-10-CM | POA: Insufficient documentation

## 2013-03-16 HISTORY — DX: Obesity, unspecified: E66.9

## 2013-03-16 LAB — CBC
HCT: 29.9 % — ABNORMAL LOW (ref 36.0–46.0)
MCHC: 32.4 g/dL (ref 30.0–36.0)
MCV: 74.6 fL — ABNORMAL LOW (ref 78.0–100.0)
RDW: 15.4 % (ref 11.5–15.5)

## 2013-03-16 LAB — URIC ACID: Uric Acid, Serum: 4.7 mg/dL (ref 2.4–7.0)

## 2013-03-16 LAB — COMPREHENSIVE METABOLIC PANEL
Albumin: 2 g/dL — ABNORMAL LOW (ref 3.5–5.2)
BUN: 6 mg/dL (ref 6–23)
Creatinine, Ser: 0.52 mg/dL (ref 0.50–1.10)
Potassium: 3.8 mEq/L (ref 3.5–5.1)
Total Protein: 6 g/dL (ref 6.0–8.3)

## 2013-03-16 MED ORDER — OXYCODONE-ACETAMINOPHEN 5-325 MG PO TABS
2.0000 | ORAL_TABLET | Freq: Once | ORAL | Status: AC
  Administered 2013-03-16: 2 via ORAL
  Filled 2013-03-16: qty 2

## 2013-03-16 NOTE — MAU Note (Signed)
Patient states she was seen in the office today due to vomiting all morning. States she was sent to MAU for elevated blood pressure evaluation.

## 2013-03-19 IMAGING — US US OB TRANSVAGINAL
1 series · 13 of 28 positions shown · non-contrast
Comparison: None.

CLINICAL DATA: Left lower quadrant pain.  Positive pregnancy test.

OBSTETRIC <14 WK US AND TRANSVAGINAL OB US
TECHNIQUE: Both transabdominal and transvaginal ultrasound
examinations were performed for complete evaluation of the
gestation as well as the maternal uterus, adnexal regions, and
pelvic cul-de-sac.  Transvaginal technique was performed to assess
early pregnancy.

[Series 1: us ob comp less 14 wks · 13 of 39 slices shown]
[im 2/39]
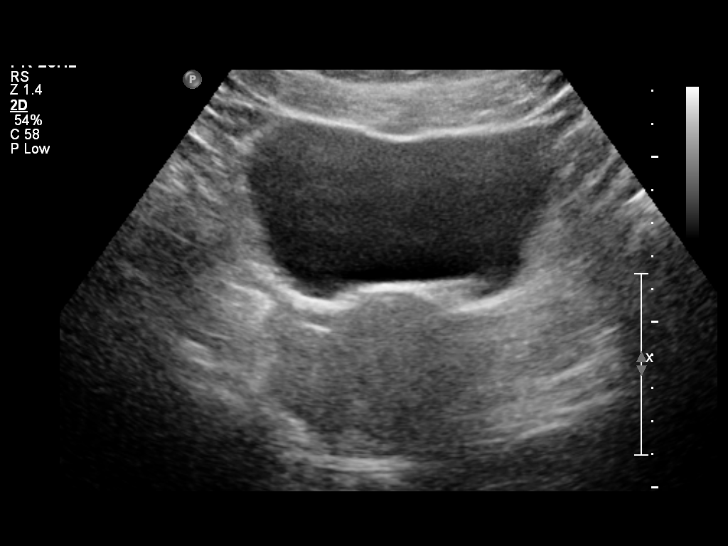
[im 5/39]
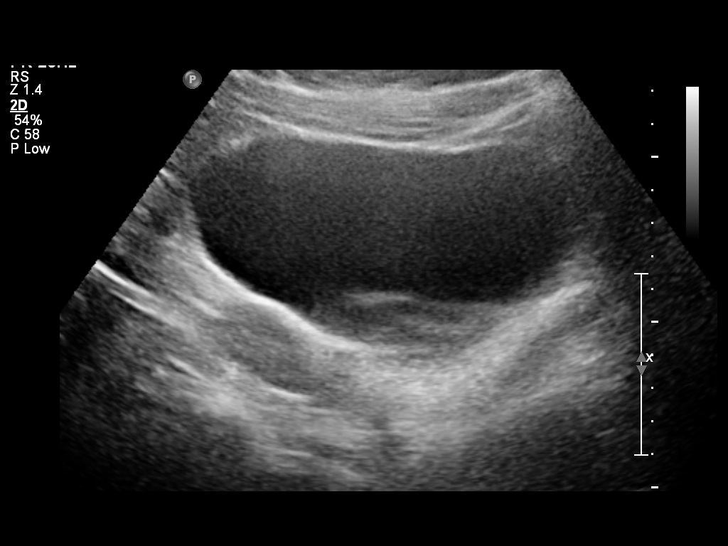
[im 8/39]
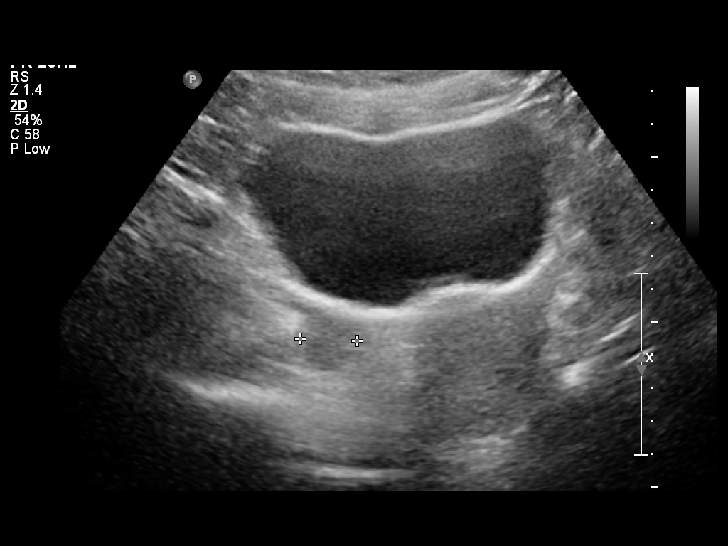
[im 10/39]
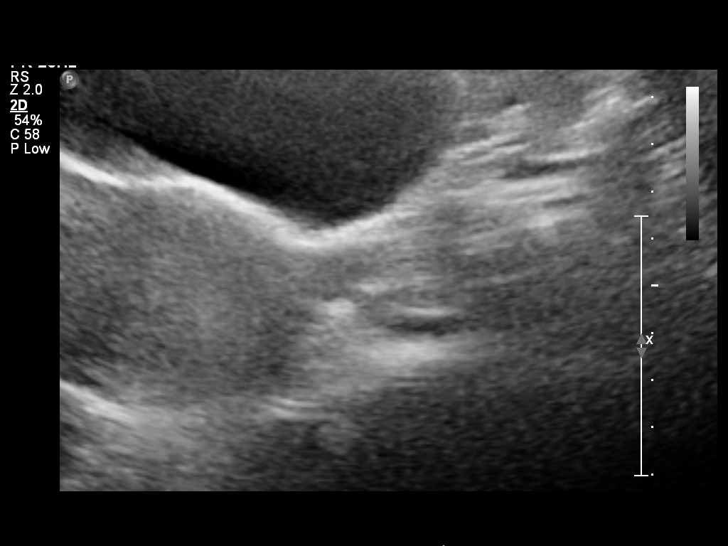
[im 13/39]
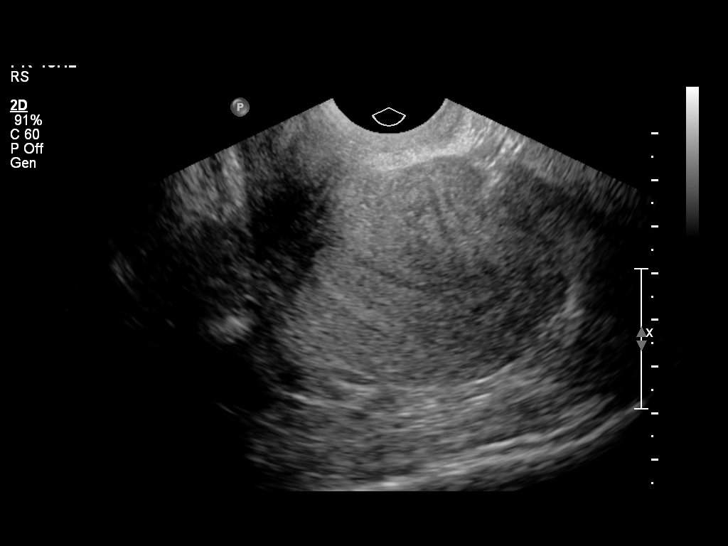
[im 16/39]
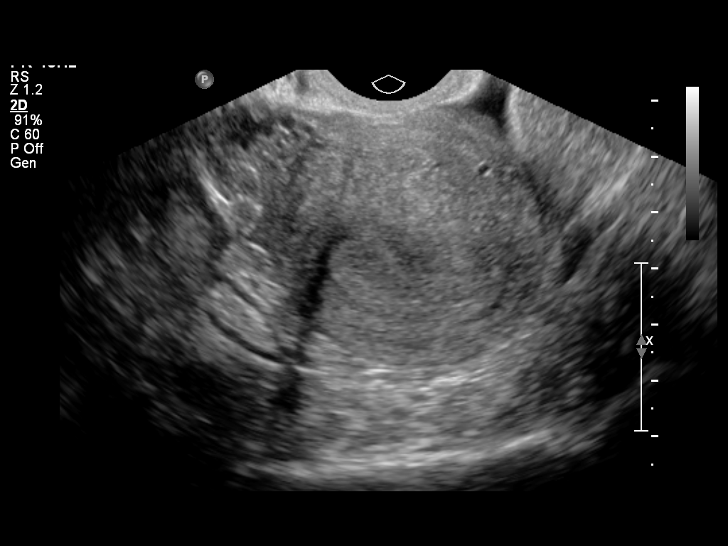
[im 20/39]
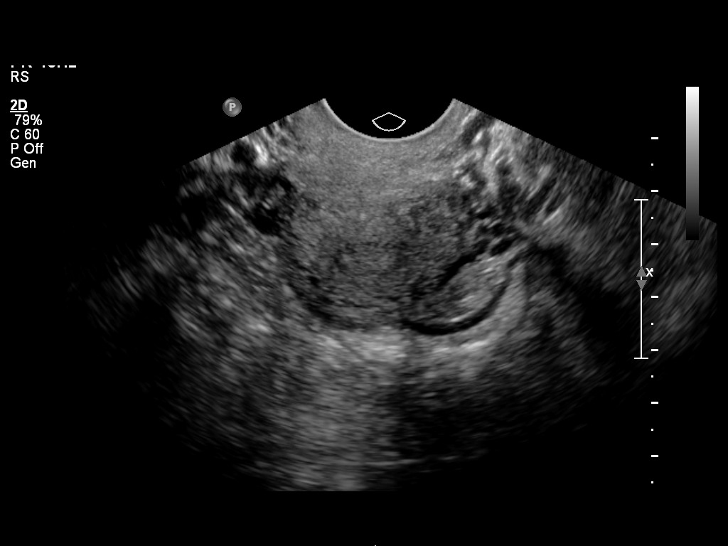
[im 23/39]
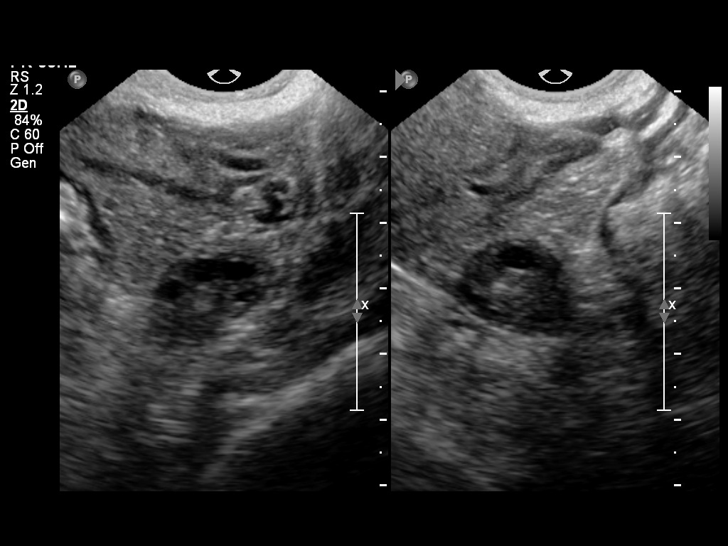
[im 26/39]
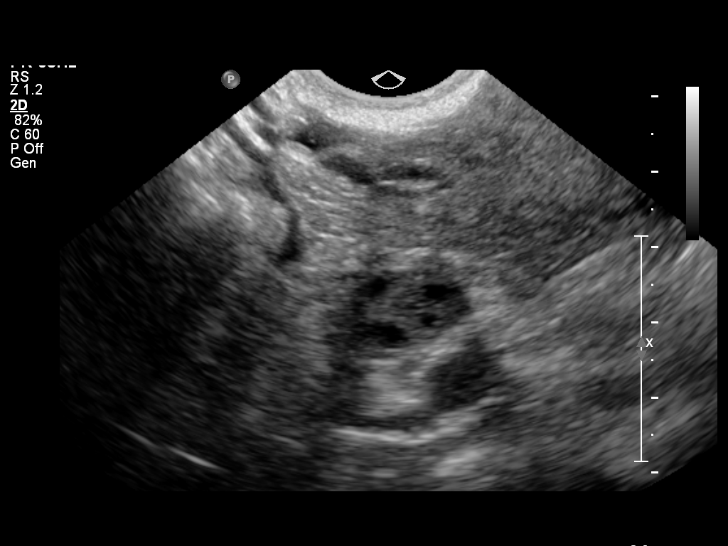
[im 29/39]
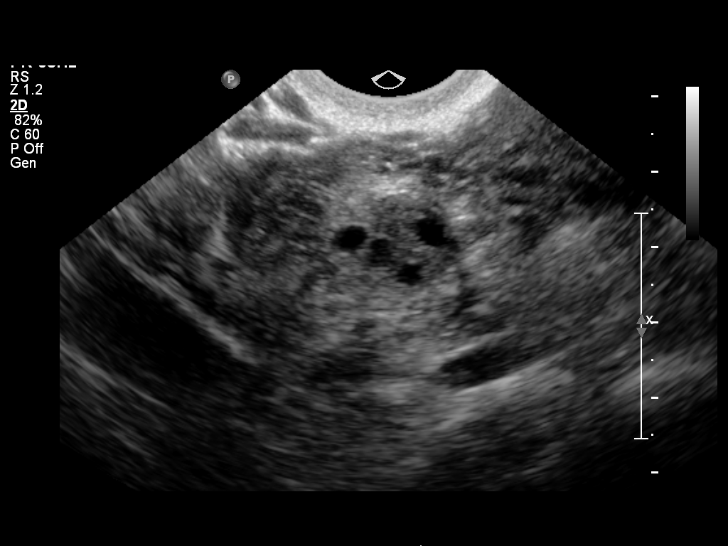
[im 31/39]
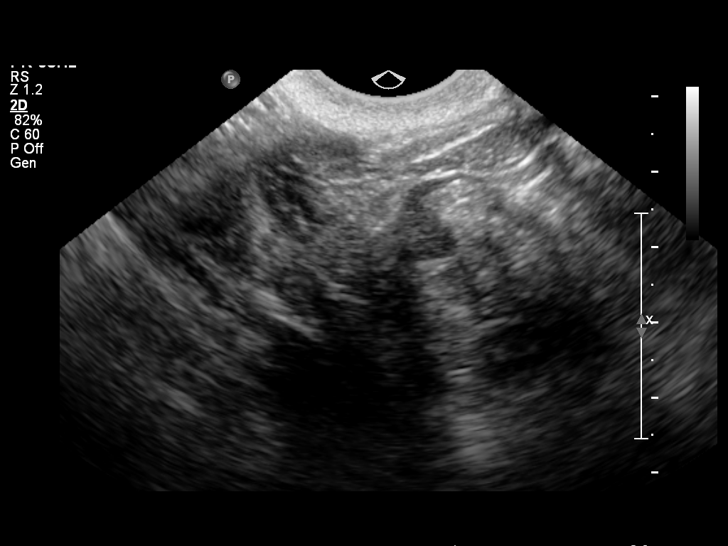
[im 34/39]
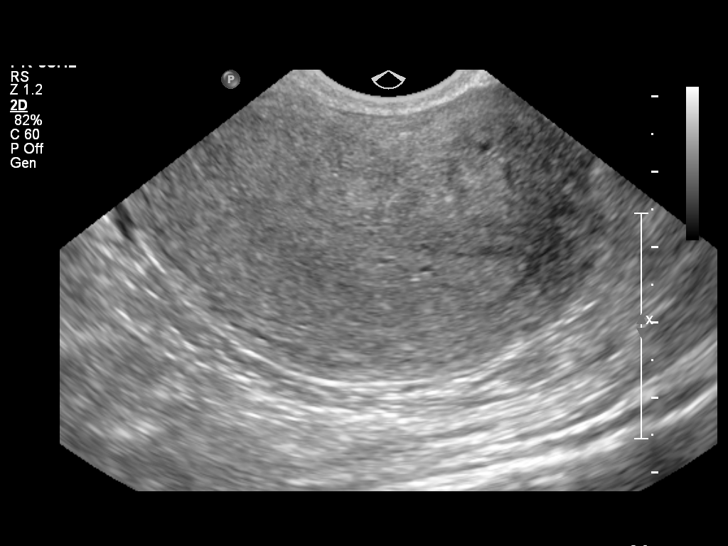
[im 37/39]
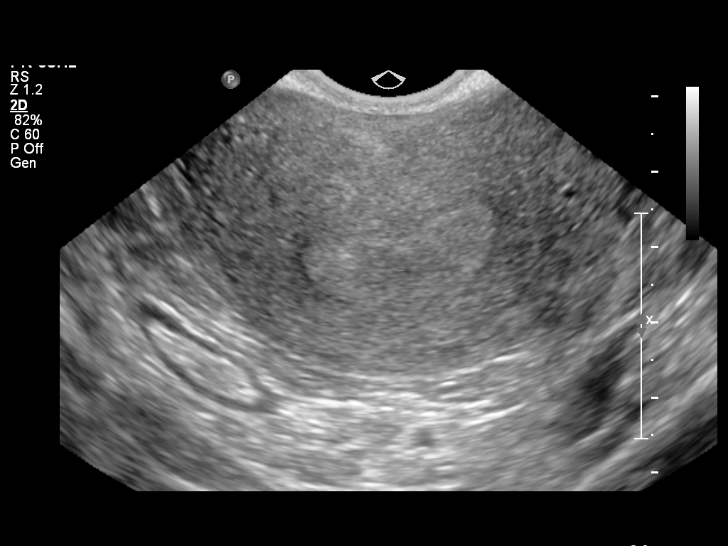

[13 of 28 positions shown; findings below may reference images not displayed]

Intrauterine gestational sac:  None visualized
Yolk sac: N/A
Embryo: N/A
Cardiac Activity: N/A

Maternal uterus/adnexae:
Uterus is homogeneous.  No evidence for fluid in the endometrial
cavity.  The maternal ovaries are normal in appearance bilaterally.
No evidence for adnexal mass.  No free fluid in the adnexal regions
or cul-de-sac.
IMPRESSION: No evidence for an intrauterine gestational sac.  There is no
evidence for an adnexal mass.  No fluid or hemorrhage in the
peritoneal cavity.

Given the history of a positive pregnancy test, differential
considerations for these findings include intrauterine gestation
too early to visualize, completed abortion, or nonvisualized
ectopic pregnancy. Close clinical correlation is recommended with
serial beta-hCG and followup ultrasound as warranted.

## 2013-03-20 ENCOUNTER — Encounter (HOSPITAL_COMMUNITY): Payer: Self-pay | Admitting: *Deleted

## 2013-03-20 ENCOUNTER — Telehealth (HOSPITAL_COMMUNITY): Payer: Self-pay | Admitting: *Deleted

## 2013-03-20 NOTE — Telephone Encounter (Signed)
Preadmission screen  

## 2013-03-22 ENCOUNTER — Inpatient Hospital Stay (HOSPITAL_COMMUNITY)
Admission: RE | Admit: 2013-03-22 | Discharge: 2013-03-26 | DRG: 775 | Disposition: A | Discharge status: Home / Self Care | Payer: BC Managed Care – PPO | Source: Ambulatory Visit | Attending: Obstetrics and Gynecology | Admitting: Obstetrics and Gynecology

## 2013-03-22 DIAGNOSIS — O139 Gestational [pregnancy-induced] hypertension without significant proteinuria, unspecified trimester: Principal | ICD-10-CM | POA: Diagnosis present

## 2013-03-22 LAB — CBC
HCT: 31.5 % — ABNORMAL LOW (ref 36.0–46.0)
Hemoglobin: 10.4 g/dL — ABNORMAL LOW (ref 12.0–15.0)
MCH: 24.5 pg — ABNORMAL LOW (ref 26.0–34.0)
Platelets: 248 10*3/uL (ref 150–400)
WBC: 11.9 10*3/uL — ABNORMAL HIGH (ref 4.0–10.5)

## 2013-03-22 LAB — TYPE AND SCREEN
ABO/RH(D): O POS
Antibody Screen: NEGATIVE

## 2013-03-22 MED ORDER — CITRIC ACID-SODIUM CITRATE 334-500 MG/5ML PO SOLN: 30.0000 mL | ORAL | Status: DC | PRN

## 2013-03-22 MED ORDER — ACETAMINOPHEN 325 MG PO TABS
650.0000 mg | ORAL_TABLET | ORAL | Status: DC | PRN
  Administered 2013-03-24: 650 mg via ORAL
  Filled 2013-03-22: qty 2

## 2013-03-22 MED ORDER — MISOPROSTOL 25 MCG QUARTER TABLET
25.0000 ug | ORAL_TABLET | ORAL | Status: DC | PRN
  Administered 2013-03-22 – 2013-03-24 (×4): 25 ug via VAGINAL
  Filled 2013-03-22: qty 0.25
  Filled 2013-03-22: qty 1
  Filled 2013-03-22 (×4): qty 0.25

## 2013-03-22 MED ORDER — FLEET ENEMA 7-19 GM/118ML RE ENEM: 1.0000 | ENEMA | RECTAL | Status: DC | PRN

## 2013-03-22 MED ORDER — LACTATED RINGERS IV SOLN
INTRAVENOUS | Status: DC
  Administered 2013-03-22 – 2013-03-25 (×4): via INTRAVENOUS

## 2013-03-22 MED ORDER — IBUPROFEN 600 MG PO TABS
600.0000 mg | ORAL_TABLET | Freq: Four times a day (QID) | ORAL | Status: DC | PRN
  Administered 2013-03-24: 600 mg via ORAL
  Filled 2013-03-22: qty 1

## 2013-03-22 MED ORDER — LACTATED RINGERS IV SOLN: 500.0000 mL | INTRAVENOUS | Status: DC | PRN

## 2013-03-22 MED ORDER — TERBUTALINE SULFATE 1 MG/ML IJ SOLN: 0.2500 mg | Freq: Once | INTRAMUSCULAR | Status: AC | PRN

## 2013-03-22 MED ORDER — ZOLPIDEM TARTRATE 5 MG PO TABS
5.0000 mg | ORAL_TABLET | Freq: Every evening | ORAL | Status: DC | PRN
  Administered 2013-03-22: 5 mg via ORAL
  Filled 2013-03-22 (×2): qty 1

## 2013-03-22 MED ORDER — LIDOCAINE HCL (PF) 1 % IJ SOLN
30.0000 mL | INTRAMUSCULAR | Status: DC | PRN
  Filled 2013-03-22: qty 30

## 2013-03-22 MED ORDER — OXYTOCIN BOLUS FROM INFUSION: 500.0000 mL | INTRAVENOUS | Status: DC

## 2013-03-22 MED ORDER — OXYCODONE-ACETAMINOPHEN 5-325 MG PO TABS: 1.0000 | ORAL_TABLET | ORAL | Status: DC | PRN

## 2013-03-22 MED ORDER — OXYTOCIN 40 UNITS IN LACTATED RINGERS INFUSION - SIMPLE MED: 62.5000 mL/h | INTRAVENOUS | Status: DC

## 2013-03-22 MED ORDER — ONDANSETRON HCL 4 MG/2ML IJ SOLN
4.0000 mg | Freq: Four times a day (QID) | INTRAMUSCULAR | Status: DC | PRN
  Administered 2013-03-25: 4 mg via INTRAVENOUS
  Filled 2013-03-22: qty 2

## 2013-03-23 ENCOUNTER — Encounter (HOSPITAL_COMMUNITY): Payer: Self-pay

## 2013-03-23 MED ORDER — OXYTOCIN 40 UNITS IN LACTATED RINGERS INFUSION - SIMPLE MED
1.0000 m[IU]/min | INTRAVENOUS | Status: DC
  Administered 2013-03-23: 2 m[IU]/min via INTRAVENOUS
  Administered 2013-03-23: 4 m[IU]/min via INTRAVENOUS
  Administered 2013-03-23: 10 m[IU]/min via INTRAVENOUS
  Administered 2013-03-23: 6 m[IU]/min via INTRAVENOUS
  Filled 2013-03-23: qty 1000

## 2013-03-23 MED ORDER — BUTORPHANOL TARTRATE 1 MG/ML IJ SOLN
2.0000 mg | Freq: Once | INTRAMUSCULAR | Status: AC | PRN
  Administered 2013-03-23: 2 mg via INTRAVENOUS
  Filled 2013-03-23: qty 2

## 2013-03-23 NOTE — H&P (Signed)
Rose Manning is a 22 y.o. female presenting for IOL last night per Dr Arelia Sneddon for Gestation HTN.  Not on meds and at this time no Scotomata, RUQ.  GFM. History OB History   Grav Para Term Preterm Abortions TAB SAB Ect Mult Living   2    1  1    0     Past Medical History  Diagnosis Date  . No pertinent past medical history   . Migraine   . Obesity   . Hx of varicella   . Pregnancy induced hypertension    Past Surgical History  Procedure Laterality Date  . Cholecystectomy    . Wisdom tooth extraction     Family History: family history includes Diabetes in her cousin and mother; Hypertension in her father, maternal grandfather, maternal grandmother, mother, paternal grandfather, and paternal grandmother; Lupus in her maternal aunt; Migraines in her mother; Rheum arthritis in her maternal aunt; Urolithiasis in her mother. There is no history of Anesthesia problems. Social History:  reports that she has never smoked. She has never used smokeless tobacco. She reports that she does not drink alcohol or use illicit drugs.   Prenatal Transfer Tool  Maternal Diabetes: No Genetic Screening: Normal Maternal Ultrasounds/Referrals: Normal Fetal Ultrasounds or other Referrals:  None Maternal Substance Abuse:  No Significant Maternal Medications:  None Significant Maternal Lab Results:  None Other Comments:  None  ROS  Dilation: Fingertip Effacement (%): Thick Station: -3 Exam by:: a. white rn  Per my exam - FT ext os/ Int os closed /Thick/high DTRs 1/4 Blood pressure 125/76, pulse 76, temperature 98.2 F (36.8 C), temperature source Oral, resp. rate 20, last menstrual period 06/14/2012. Exam Physical Exam  Prenatal labs: ABO, Rh: --/--/O POS (11/02 1930) Antibody: NEG (11/02 1930) Rubella: Nonimmune (03/28 0000) RPR: NON REACTIVE (11/02 1930)  HBsAg: Negative (03/28 0000)  HIV: Non-reactive (03/28 0000)  GBS: Negative (10/09 0000)   Assessment/Plan: IUP at 39 weeks  admitted for IOL due to Gestational HTN No sxs of Preeclampsia.  Will check labs Minimal change with Cytotec last HS.  Ptiocin today   Wilford Merryfield C 03/23/2013, 8:43 AM

## 2013-03-23 NOTE — Progress Notes (Signed)
Patient ID: Rose Manning, female   DOB: 11-21-1990, 22 y.o.   MRN: [id number] Pt uncomfortable with ctxs.  Stadol given for pain VSSAF FHR 140s Cat 1 Ctxs q 2-4 minutes on pitocin throughout day  Cx FT/80/-3 per RN   CHTN induction -  Plan to hold pitocin tonight and switch to cytotec tonight Pt stable

## 2013-03-24 ENCOUNTER — Encounter (HOSPITAL_COMMUNITY): Payer: Self-pay

## 2013-03-24 ENCOUNTER — Encounter (HOSPITAL_COMMUNITY): Payer: BC Managed Care – PPO | Admitting: Anesthesiology

## 2013-03-24 ENCOUNTER — Inpatient Hospital Stay (HOSPITAL_COMMUNITY): Payer: BC Managed Care – PPO | Admitting: Anesthesiology

## 2013-03-24 LAB — COMPREHENSIVE METABOLIC PANEL
ALT: 16 U/L (ref 0–35)
AST: 15 U/L (ref 0–37)
Alkaline Phosphatase: 155 U/L — ABNORMAL HIGH (ref 39–117)
CO2: 22 mEq/L (ref 19–32)
Calcium: 8.7 mg/dL (ref 8.4–10.5)
Chloride: 105 mEq/L (ref 96–112)
GFR calc Af Amer: >90 mL/min (ref >90)
GFR calc non Af Amer: >90 mL/min (ref >90)
Sodium: 136 mEq/L (ref 135–145)

## 2013-03-24 LAB — CBC
HCT: 30.7 % — ABNORMAL LOW (ref 36.0–46.0)
HCT: 33 % — ABNORMAL LOW (ref 36.0–46.0)
Hemoglobin: 9.9 g/dL — ABNORMAL LOW (ref 12.0–15.0)
MCHC: 32.1 g/dL (ref 30.0–36.0)
MCV: 74.8 fL — ABNORMAL LOW (ref 78.0–100.0)
Platelets: 202 10*3/uL (ref 150–400)
Platelets: 217 10*3/uL (ref 150–400)
RBC: 4.12 MIL/uL (ref 3.87–5.11)
RDW: 15.9 % — ABNORMAL HIGH (ref 11.5–15.5)
WBC: 8.5 10*3/uL (ref 4.0–10.5)

## 2013-03-24 MED ORDER — MISOPROSTOL 25 MCG QUARTER TABLET
25.0000 ug | ORAL_TABLET | Freq: Once | ORAL | Status: AC
  Administered 2013-03-24: 25 ug via VAGINAL
  Filled 2013-03-24: qty 0.25

## 2013-03-24 MED ORDER — TERBUTALINE SULFATE 1 MG/ML IJ SOLN: 0.2500 mg | Freq: Once | INTRAMUSCULAR | Status: AC | PRN

## 2013-03-24 MED ORDER — BUTORPHANOL TARTRATE 1 MG/ML IJ SOLN
1.0000 mg | INTRAMUSCULAR | Status: DC | PRN
  Administered 2013-03-24: 1 mg via INTRAVENOUS

## 2013-03-24 MED ORDER — OXYTOCIN 40 UNITS IN LACTATED RINGERS INFUSION - SIMPLE MED
1.0000 m[IU]/min | INTRAVENOUS | Status: DC
  Administered 2013-03-24: 2 m[IU]/min via INTRAVENOUS

## 2013-03-24 MED ORDER — EPHEDRINE 5 MG/ML INJ: 10.0000 mg | INTRAVENOUS | Status: DC | PRN

## 2013-03-24 MED ORDER — FENTANYL 2.5 MCG/ML BUPIVACAINE 1/10 % EPIDURAL INFUSION (WH - ANES)
INTRAMUSCULAR | Status: DC | PRN
  Administered 2013-03-24: 14 mL/h via EPIDURAL

## 2013-03-24 MED ORDER — EPHEDRINE 5 MG/ML INJ
10.0000 mg | INTRAVENOUS | Status: DC | PRN
  Filled 2013-03-24: qty 4

## 2013-03-24 MED ORDER — LIDOCAINE HCL (PF) 1 % IJ SOLN
INTRAMUSCULAR | Status: DC | PRN
  Administered 2013-03-24 (×2): 4 mL

## 2013-03-24 MED ORDER — BUTORPHANOL TARTRATE 1 MG/ML IJ SOLN
INTRAMUSCULAR | Status: AC
  Filled 2013-03-24: qty 1

## 2013-03-24 MED ORDER — PHENYLEPHRINE 40 MCG/ML (10ML) SYRINGE FOR IV PUSH (FOR BLOOD PRESSURE SUPPORT)
80.0000 ug | PREFILLED_SYRINGE | INTRAVENOUS | Status: DC | PRN
  Filled 2013-03-24: qty 10

## 2013-03-24 MED ORDER — PHENYLEPHRINE 40 MCG/ML (10ML) SYRINGE FOR IV PUSH (FOR BLOOD PRESSURE SUPPORT): 80.0000 ug | PREFILLED_SYRINGE | INTRAVENOUS | Status: DC | PRN

## 2013-03-24 MED ORDER — LACTATED RINGERS IV SOLN
500.0000 mL | Freq: Once | INTRAVENOUS | Status: AC
  Administered 2013-03-24: 500 mL via INTRAVENOUS

## 2013-03-24 MED ORDER — FENTANYL 2.5 MCG/ML BUPIVACAINE 1/10 % EPIDURAL INFUSION (WH - ANES)
14.0000 mL/h | INTRAMUSCULAR | Status: DC | PRN
  Administered 2013-03-24: 14 mL/h via EPIDURAL
  Filled 2013-03-24 (×2): qty 125

## 2013-03-24 MED ORDER — DIPHENHYDRAMINE HCL 50 MG/ML IJ SOLN: 12.5000 mg | INTRAMUSCULAR | Status: DC | PRN

## 2013-03-24 NOTE — Anesthesia Preprocedure Evaluation (Signed)
Anesthesia Evaluation  Patient identified by MRN, date of birth, ID band Patient awake    Reviewed: Allergy & Precautions, H&P , Patient's Chart, lab work & pertinent test results  Airway Mallampati: III TM Distance: >3 FB Neck ROM: Full    Dental no notable dental hx. (+) Teeth Intact   Pulmonary neg pulmonary ROS,  breath sounds clear to auscultation  Pulmonary exam normal       Cardiovascular hypertension, Rhythm:Regular Rate:Normal  PIH   Neuro/Psych  Headaches, negative psych ROS   GI/Hepatic negative GI ROS, Neg liver ROS,   Endo/Other  Morbid obesity  Renal/GU negative Renal ROS  negative genitourinary   Musculoskeletal negative musculoskeletal ROS (+)   Abdominal (+) + obese,   Peds  Hematology negative hematology ROS (+)   Anesthesia Other Findings   Reproductive/Obstetrics (+) Pregnancy                           Anesthesia Physical Anesthesia Plan  ASA: III  Anesthesia Plan: Epidural   Post-op Pain Management:    Induction:   Airway Management Planned: Natural Airway  Additional Equipment:   Intra-op Plan:   Post-operative Plan:   Informed Consent: I have reviewed the patients History and Physical, chart, labs and discussed the procedure including the risks, benefits and alternatives for the proposed anesthesia with the patient or authorized representative who has indicated his/her understanding and acceptance.     Plan Discussed with: Anesthesiologist  Anesthesia Plan Comments:         Anesthesia Quick Evaluation

## 2013-03-24 NOTE — Progress Notes (Signed)
Pt feeling ctx but not too uncomfortable  FHT reassuring Toco occasional Cvx 1/th/-2, vtx Cervical foley inserted without difficulty Filled with 60cc   A/P;  Continue IOL Will start pitocin

## 2013-03-24 NOTE — Anesthesia Procedure Notes (Signed)
Epidural Patient location during procedure: OB Start time: 03/24/2013 3:24 PM  Staffing Anesthesiologist: Kmari Brian A. Performed by: anesthesiologist   Preanesthetic Checklist Completed: patient identified, site marked, surgical consent, pre-op evaluation, timeout performed, IV checked, risks and benefits discussed and monitors and equipment checked  Epidural Patient position: sitting Prep: site prepped and draped and DuraPrep Patient monitoring: continuous pulse ox and blood pressure Approach: midline Injection technique: LOR air  Needle:  Needle type: Tuohy  Needle gauge: 17 G Needle length: 9 cm and 9 Needle insertion depth: 5 cm and 10 cm Catheter type: closed end flexible Catheter size: 19 Gauge Catheter at skin depth: 10 and 16 cm Test dose: negative and Other  Assessment Events: blood not aspirated, injection not painful, no injection resistance, negative IV test and no paresthesia  Additional Notes Patient identified. Risks and benefits discussed including failed block, incomplete  Pain control, post dural puncture headache, nerve damage, paralysis, blood pressure Changes, nausea, vomiting, reactions to medications-both toxic and allergic and post Partum back pain. All questions were answered. Patient expressed understanding and wished to proceed. Sterile technique was used throughout procedure. Epidural site was Dressed with sterile barrier dressing. No paresthesias, signs of intravascular injection Or signs of intrathecal spread were encountered.  Patient was more comfortable after the epidural was dosed. Please see RN's note for documentation of vital signs and FHR which are stable.

## 2013-03-24 NOTE — Progress Notes (Signed)
Pt comfortable w/ epidural.  Foley out  BP 130-140/90s-101  FHT reassuring w/ accels Toco irregular Q 2-6 Cvx 4/50/-2  A/P:  Continue pitocin induction

## 2013-03-24 NOTE — Progress Notes (Signed)
Pt resting comfortably.    BP stable 130s/80-90s  FHT reassuring, cat 1 Toco Q5 Cvx 1/th/-2, vtx  A/P:  Plan to stop pitocin given cvx still 1cm Continue cytotec indxn

## 2013-03-25 ENCOUNTER — Encounter (HOSPITAL_COMMUNITY): Payer: Self-pay

## 2013-03-25 MED ORDER — PRENATAL MULTIVITAMIN CH
1.0000 | ORAL_TABLET | Freq: Every day | ORAL | Status: DC
  Administered 2013-03-25 – 2013-03-26 (×2): 1 via ORAL
  Filled 2013-03-25 (×2): qty 1

## 2013-03-25 MED ORDER — DIPHENHYDRAMINE HCL 25 MG PO CAPS: 25.0000 mg | ORAL_CAPSULE | Freq: Four times a day (QID) | ORAL | Status: DC | PRN

## 2013-03-25 MED ORDER — IBUPROFEN 600 MG PO TABS
600.0000 mg | ORAL_TABLET | Freq: Four times a day (QID) | ORAL | Status: DC
  Administered 2013-03-25 – 2013-03-26 (×6): 600 mg via ORAL
  Filled 2013-03-25 (×6): qty 1

## 2013-03-25 MED ORDER — OXYCODONE-ACETAMINOPHEN 5-325 MG PO TABS: 1.0000 | ORAL_TABLET | ORAL | Status: DC | PRN

## 2013-03-25 MED ORDER — ONDANSETRON HCL 4 MG PO TABS: 4.0000 mg | ORAL_TABLET | ORAL | Status: DC | PRN

## 2013-03-25 MED ORDER — ONDANSETRON HCL 4 MG/2ML IJ SOLN: 4.0000 mg | INTRAMUSCULAR | Status: DC | PRN

## 2013-03-25 MED ORDER — TETANUS-DIPHTH-ACELL PERTUSSIS 5-2.5-18.5 LF-MCG/0.5 IM SUSP: 0.5000 mL | Freq: Once | INTRAMUSCULAR | Status: DC

## 2013-03-25 MED ORDER — LANOLIN HYDROUS EX OINT: TOPICAL_OINTMENT | CUTANEOUS | Status: DC | PRN

## 2013-03-25 MED ORDER — MEDROXYPROGESTERONE ACETATE 150 MG/ML IM SUSP: 150.0000 mg | INTRAMUSCULAR | Status: DC | PRN

## 2013-03-25 MED ORDER — SENNOSIDES-DOCUSATE SODIUM 8.6-50 MG PO TABS
2.0000 | ORAL_TABLET | ORAL | Status: DC
  Administered 2013-03-26: 2 via ORAL
  Filled 2013-03-25: qty 2

## 2013-03-25 MED ORDER — BENZOCAINE-MENTHOL 20-0.5 % EX AERO
1.0000 "application " | INHALATION_SPRAY | CUTANEOUS | Status: DC | PRN
  Filled 2013-03-25: qty 56

## 2013-03-25 MED ORDER — SIMETHICONE 80 MG PO CHEW: 80.0000 mg | CHEWABLE_TABLET | ORAL | Status: DC | PRN

## 2013-03-25 MED ORDER — DIBUCAINE 1 % RE OINT
1.0000 "application " | TOPICAL_OINTMENT | RECTAL | Status: DC | PRN
  Administered 2013-03-25: 1 via RECTAL
  Filled 2013-03-25 (×2): qty 28

## 2013-03-25 MED ORDER — MEASLES, MUMPS & RUBELLA VAC ~~LOC~~ INJ
0.5000 mL | INJECTION | Freq: Once | SUBCUTANEOUS | Status: AC
  Administered 2013-03-26: 0.5 mL via SUBCUTANEOUS
  Filled 2013-03-25 (×2): qty 0.5

## 2013-03-25 MED ORDER — WITCH HAZEL-GLYCERIN EX PADS
1.0000 "application " | MEDICATED_PAD | CUTANEOUS | Status: DC | PRN
  Administered 2013-03-25: 1 via TOPICAL

## 2013-03-25 NOTE — Anesthesia Postprocedure Evaluation (Signed)
  Anesthesia Post-op Note  Patient: Rose Manning  Procedure(s) Performed: * No procedures listed *  Patient Location: Mother/Baby  Anesthesia Type:Epidural  Level of Consciousness: awake  Airway and Oxygen Therapy: Patient Spontanous Breathing  Post-op Pain: none  Post-op Assessment: Patient's Cardiovascular Status Stable, Respiratory Function Stable, Patent Airway, No signs of Nausea or vomiting, Adequate PO intake, Pain level controlled, No headache, No backache, No residual numbness and No residual motor weakness  Post-op Vital Signs: Reviewed and stable  Complications: No apparent anesthesia complications

## 2013-03-25 NOTE — Evaluation (Signed)
Asked to see the patient by her nurse because the patient continues to have a sensory deficit from her knee up to her hip. The patient has been able to ambulate without difficulty since resolution of her epidural which she states worked better for her right side vs left. She denies paresthesia and the procedure note does state that there was one. Review of the chart and talking to the patient confirm that two adults did flex both of her hips into her groin area to effect a vaginal delivery. The patient also has swelling of her right thigh as well. Delivery of the baby was accomplished with three pushes. 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 Since there is no motor deficit I doubt the obturator nerve is involved. The current swelling of her right thigh may be due to the insult of the nerve but is likely causing exacerbation of the situation. I explained this to the patient and two adults in the room and they acknowledged an understanding of the situation as well as that it resolve in 7-14 days.

## 2013-03-25 NOTE — Progress Notes (Signed)
SVD of vigerous female infant w/ apgars of 8,9.  Mild shoulder dystocia - relieved easily with McRoberts and suprapubic pressure. Placenta delivered spontaneous w/ 3VC.   Deep 1st degree lac repaired w/ 3-0 vicryl rapide. Tissue very edematous  Fundus firm.  EBL 400cc .

## 2013-03-25 NOTE — Progress Notes (Signed)
Post Partum Day 0 Subjective: no complaints, up ad lib, voiding and tolerating PO  Objective: Blood pressure 119/74, pulse 70, temperature 98.4 F (36.9 C), temperature source Oral, resp. rate 20, last menstrual period 06/14/2012, SpO2 99.00%, unknown if currently breastfeeding.  Physical Exam:  General: alert and cooperative Lochia: appropriate Uterine Fundus: firm Incision: dehiscence present perineum intact DVT Evaluation: No evidence of DVT seen on physical exam. Negative Homan's sign. No cords or calf tenderness.   Recent Labs  03/24/13 0640 03/24/13 1420  HGB 9.9* 10.6*  HCT 30.7* 33.0*    Assessment/Plan: Plan for discharge tomorrow   LOS: 3 days   CURTIS,CAROL G 03/25/2013, 8:41 AM

## 2013-03-25 NOTE — Lactation Note (Signed)
This note was copied from the chart of Rose Manning. Lactation Consultation Note  Patient Name: Rose Manning [id number] Date: 03/25/2013 Reason for consult: Initial assessment Baby is awake but spitty at this visit, getting up some clear mucous. BF basics reviewed, encouraged to Bf with feedings ques, 8-12 or more in 24 hours starting his 2nd day of life. Lactation brochure left for review, Advised of OP services and support group. Mom's nipples are flat, do become more erect with stimulation, hand expression. The left nipple dimples with sandwiching. Both nipples are compressible.  Mom has hand pump to pre-pump. Encouraged hand expression. Advised to call for assist with latch.   Maternal Data Formula Feeding for Exclusion: No Infant to breast within first hour of birth: Yes Has patient been taught Hand Expression?: Yes Does the patient have breastfeeding experience prior to this delivery?: No  Feeding Feeding Type: Breast Fed Length of feed: 0 min (sleepy)  LATCH Score/Interventions                Intervention(s): Breastfeeding basics reviewed;Support Pillows;Position options;Skin to skin     Lactation Tools Discussed/Used Tools: Pump Breast pump type: Manual WIC Program: Yes   Consult Status Consult Status: Follow-up Date: 03/26/13 Follow-up type: In-patient    Alfred Levins 03/25/2013, 3:25 PM

## 2013-03-25 NOTE — Lactation Note (Signed)
This note was copied from the chart of Rose Taeko Schaffer. Lactation Consultation Note  Patient Name: Rose Manning [id number] Date: 03/25/2013 Reason for consult: Follow-up assessment;Difficult latch Baby awake at this visit, Mom attempting to latch baby to right breast in football hold. Mom is getting lots of colostrum with hand expression. Demonstrated how to sandwich the nipple but baby cannot obtain or sustain a latch. Mom's nipple flattens, slightly inverts, baby has recessed chin. Initiated #20 nipple shield. After few attempts baby was able to latch, with stimulation demonstrated a good rhythmic suck, colostrum present in the nipple shield at the end of the feeding. Encouraged Mom to BF with feeding ques, use hand expression/breast pump to help with latch. If baby cannot latch, use nipple shield as instructed. Reviewed cluster feedings. Advised to ask for assist as needed.   Maternal Data Formula Feeding for Exclusion: No Infant to breast within first hour of birth: Yes Has patient been taught Hand Expression?: Yes Does the patient have breastfeeding experience prior to this delivery?: No  Feeding Feeding Type: Breast Fed  LATCH Score/Interventions Latch: Grasps breast easily, tongue down, lips flanged, rhythmical sucking. (using #20 nipple shield)  Audible Swallowing: A few with stimulation  Type of Nipple: Flat  Comfort (Breast/Nipple): Soft / non-tender     Hold (Positioning): Assistance needed to correctly position infant at breast and maintain latch. Intervention(s): Breastfeeding basics reviewed;Support Pillows;Position options;Skin to skin  LATCH Score: 7  Lactation Tools Discussed/Used Tools: Nipple Dorris Carnes;Pump Nipple shield size: 20;24 Breast pump type: Manual WIC Program: Yes   Consult Status Consult Status: Follow-up Date: 03/26/13 Follow-up type: In-patient    Alfred Levins 03/25/2013, 6:36 PM

## 2013-03-26 MED ORDER — IBUPROFEN 600 MG PO TABS: 600.0000 mg | ORAL_TABLET | Freq: Four times a day (QID) | ORAL | Status: DC

## 2013-03-26 NOTE — Discharge Summary (Signed)
Obstetric Discharge Summary Reason for Admission: induction of labor Prenatal Procedures: ultrasound Intrapartum Procedures: spontaneous vaginal delivery Postpartum Procedures: none Complications-Operative and Postpartum: 1 degree perineal laceration Hemoglobin  Date Value Range Status  03/24/2013 10.6* 12.0 - 15.0 g/dL Final     HCT  Date Value Range Status  03/24/2013 33.0* 36.0 - 46.0 % Final    Physical Exam:  General: alert and cooperative Lochia: appropriate Uterine Fundus: firm Incision: perineum intact DVT Evaluation: No evidence of DVT seen on physical exam. Negative Homan's sign. No cords or calf tenderness.  Discharge Diagnoses: Term Pregnancy-delivered  Discharge Information: Date: 03/26/2013 Activity: pelvic rest Diet: routine Medications: PNV and Ibuprofen Condition: stable Instructions: refer to practice specific booklet Discharge to: home   Newborn Data: Live born female  Birth Weight: 6 lb 8.2 oz (2955 g) APGAR: 8, 9  Home with mother.  Kasey Hansell G 03/26/2013, 8:36 AM

## 2013-05-24 ENCOUNTER — Encounter (HOSPITAL_COMMUNITY): Payer: Self-pay | Admitting: Emergency Medicine

## 2013-05-24 ENCOUNTER — Emergency Department (HOSPITAL_COMMUNITY)
Admission: EM | Admit: 2013-05-24 | Discharge: 2013-05-25 | Disposition: A | Discharge status: Home / Self Care | Payer: BC Managed Care – PPO | Attending: Emergency Medicine | Admitting: Emergency Medicine

## 2013-05-24 DIAGNOSIS — Y929 Unspecified place or not applicable: Secondary | ICD-10-CM | POA: Insufficient documentation

## 2013-05-24 DIAGNOSIS — L500 Allergic urticaria: Secondary | ICD-10-CM | POA: Insufficient documentation

## 2013-05-24 DIAGNOSIS — Z8679 Personal history of other diseases of the circulatory system: Secondary | ICD-10-CM | POA: Insufficient documentation

## 2013-05-24 DIAGNOSIS — Z79899 Other long term (current) drug therapy: Secondary | ICD-10-CM | POA: Insufficient documentation

## 2013-05-24 DIAGNOSIS — T783XXA Angioneurotic edema, initial encounter: Secondary | ICD-10-CM

## 2013-05-24 DIAGNOSIS — L509 Urticaria, unspecified: Secondary | ICD-10-CM

## 2013-05-24 DIAGNOSIS — Y9389 Activity, other specified: Secondary | ICD-10-CM | POA: Insufficient documentation

## 2013-05-24 DIAGNOSIS — M7989 Other specified soft tissue disorders: Secondary | ICD-10-CM | POA: Insufficient documentation

## 2013-05-24 DIAGNOSIS — T61771A Other fish poisoning, accidental (unintentional), initial encounter: Secondary | ICD-10-CM | POA: Insufficient documentation

## 2013-05-24 DIAGNOSIS — Z8619 Personal history of other infectious and parasitic diseases: Secondary | ICD-10-CM | POA: Insufficient documentation

## 2013-05-24 DIAGNOSIS — T7840XA Allergy, unspecified, initial encounter: Secondary | ICD-10-CM

## 2013-05-24 DIAGNOSIS — E669 Obesity, unspecified: Secondary | ICD-10-CM | POA: Insufficient documentation

## 2013-05-24 MED ORDER — IPRATROPIUM BROMIDE 0.02 % IN SOLN
0.5000 mg | Freq: Once | RESPIRATORY_TRACT | Status: AC
  Administered 2013-05-24: 0.5 mg via RESPIRATORY_TRACT
  Filled 2013-05-24: qty 2.5

## 2013-05-24 MED ORDER — EPINEPHRINE 0.3 MG/0.3ML IJ SOAJ
0.3000 mg | Freq: Once | INTRAMUSCULAR | Status: AC
  Administered 2013-05-24: 0.3 mg via INTRAMUSCULAR
  Filled 2013-05-24: qty 0.3

## 2013-05-24 MED ORDER — FAMOTIDINE IN NACL 20-0.9 MG/50ML-% IV SOLN
INTRAVENOUS | Status: AC
  Administered 2013-05-24: 22:00:00 20 mg via INTRAVENOUS
  Filled 2013-05-24: qty 50

## 2013-05-24 MED ORDER — DIPHENHYDRAMINE HCL 50 MG/ML IJ SOLN
INTRAMUSCULAR | Status: AC
  Administered 2013-05-24: 22:00:00 50 mg via INTRAVENOUS
  Filled 2013-05-24: qty 1

## 2013-05-24 MED ORDER — FAMOTIDINE IN NACL 20-0.9 MG/50ML-% IV SOLN
20.0000 mg | Freq: Once | INTRAVENOUS | Status: AC
  Administered 2013-05-24: 20 mg via INTRAVENOUS

## 2013-05-24 MED ORDER — METHYLPREDNISOLONE SODIUM SUCC 125 MG IJ SOLR
125.0000 mg | Freq: Once | INTRAMUSCULAR | Status: AC
  Administered 2013-05-24: 125 mg via INTRAVENOUS

## 2013-05-24 MED ORDER — METHYLPREDNISOLONE SODIUM SUCC 125 MG IJ SOLR
INTRAMUSCULAR | Status: AC
  Administered 2013-05-24: 22:00:00 125 mg via INTRAVENOUS
  Filled 2013-05-24: qty 2

## 2013-05-24 MED ORDER — ALBUTEROL SULFATE (2.5 MG/3ML) 0.083% IN NEBU
5.0000 mg | INHALATION_SOLUTION | Freq: Once | RESPIRATORY_TRACT | Status: AC
  Administered 2013-05-24: 5 mg via RESPIRATORY_TRACT
  Filled 2013-05-24: qty 6

## 2013-05-24 MED ORDER — DIPHENHYDRAMINE HCL 50 MG/ML IJ SOLN
50.0000 mg | Freq: Once | INTRAMUSCULAR | Status: AC
  Administered 2013-05-24: 50 mg via INTRAVENOUS

## 2013-05-24 NOTE — ED Notes (Signed)
MD at bedside. 

## 2013-05-24 NOTE — ED Provider Notes (Signed)
CSN: [id number]     Arrival date & time 05/24/13  2122 History  This chart was scribed for Ward Givens, MD by Bennett Scrape, ED Scribe. This patient was seen in room APA08/APA08 and the patient's care was started at 10:02 PM.    Chief Complaint  Patient presents with  . Allergic Reaction    The history is provided by the patient. No language interpreter was used.    HPI Comments: Rose Manning is a 23 y.o. female who presents to the Emergency Department complaining of an allergic reaction that started yesterday after eating a crab dip. She states that the rash started diffusely about one hour after consumption with associated throat swelling. She states that she took 2 benadryl pills which made her nap with some improvement. She reports that she took one benadryl pill today around 2:30 PM and then took a hot shower today around 4 PM which inflamed the hives and caused bilateral hand swelling worse in the right hand. She states that she did have some mild lips swelling but states that it resolved. She denies any tongue swelling. She reports that she ate cereal and potatoes and ribs today without any difficulty. She denies any prior reactions to the same brand of crab meat and denies any family h/o seafood allergies. She denies needing any prior breathing treatments. She denies having a h/o chronic medical problems and denies being on daily medications.   No PCP.  Past Medical History  Diagnosis Date  . No pertinent past medical history   . Migraine   . Obesity   . Hx of varicella   . Pregnancy induced hypertension    Past Surgical History  Procedure Laterality Date  . Cholecystectomy    . Wisdom tooth extraction     Family History  Problem Relation Age of Onset  . Anesthesia problems Neg Hx   . Diabetes Mother   . Hypertension Mother   . Urolithiasis Mother   . Migraines Mother   . Hypertension Father   . Hypertension Maternal Grandmother   . Hypertension Maternal  Grandfather   . Hypertension Paternal Grandmother   . Hypertension Paternal Grandfather   . Lupus Maternal Aunt   . Rheum arthritis Maternal Aunt   . Diabetes Cousin    History  Substance Use Topics  . Smoking status: Never Smoker   . Smokeless tobacco: Never Used  . 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 Negative for trouble swallowing.   Respiratory: Negative for shortness of breath.   Musculoskeletal: Positive for joint swelling (bilateral hand swelling).  Skin: Positive for rash.  All other systems reviewed and are negative.    Allergies  Review of patient's allergies indicates no known allergies.  Home Medications   Current Outpatient Rx  Name  Route  Sig  Dispense  Refill  . ibuprofen (ADVIL,MOTRIN) 600 MG tablet   Oral   Take 1 tablet (600 mg total) by mouth every 6 (six) hours.   30 tablet   1   . Prenatal Vit-Fe Fumarate-FA (PRENATAL MULTIVITAMIN) TABS   Oral   Take 1 tablet by mouth daily at 12 noon.          Triage Vitals:  BP 141/113  Pulse 97  Temp(Src) 98.6 F (37 C) (Oral)  Resp 20  Ht 5\' 5"  (1.651 m)  Wt 270 lb (122.471 kg)  BMI 44.93 kg/m2  SpO2 97%  LMP 04/20/2013  Vital signs normal    Physical Exam  Nursing note and vitals reviewed. Constitutional: She is oriented to person, place, and time. She appears well-developed and well-nourished.  Non-toxic appearance. She does not appear ill. No distress.  HENT:  Head: Normocephalic and atraumatic.  Right Ear: External ear normal.  Left Ear: External ear normal.  Nose: Nose normal. No mucosal edema or rhinorrhea.  Mouth/Throat: Oropharynx is clear and moist and mucous membranes are normal. No dental abscesses or uvula swelling.  No mouth edema, tongue appears normal  Eyes: Conjunctivae  and EOM are normal. Pupils are equal, round, and reactive to light.  Neck: Normal range of motion and full passive range of motion without pain. Neck supple.  Cardiovascular: Normal rate, regular rhythm and normal heart sounds.  Exam reveals no gallop and no friction rub.   No murmur heard. Pulmonary/Chest: Effort normal. No respiratory distress. She has no wheezes. She has no rhonchi. She has no rales. She exhibits no tenderness and no crepitus.  coarse breath sounds, tight sounding cough  Abdominal: Soft. Normal appearance and bowel sounds are normal. She exhibits no distension. There is no tenderness. There is no rebound and no guarding.  Musculoskeletal: Normal range of motion. She exhibits no edema and no tenderness.  Moves all extremities well.   Neurological: She is alert and oriented to person, place, and time. She has normal strength. No cranial nerve deficit.  Skin: Skin is warm, dry and intact. Rash noted. No erythema. No pallor.  Diffuse small urticarial lesions that are collalascing on her face, neck, trunk and extremities   Psychiatric: She has a normal mood and affect. Her speech is normal and behavior is normal. Her mood appears not anxious.    ED Course  Procedures (including critical care time)  Medications  diphenhydrAMINE (BENADRYL) injection 50 mg (50 mg Intravenous Given 05/24/13 2142)  methylPREDNISolone sodium succinate (SOLU-MEDROL) 125 mg/2 mL injection 125 mg (125 mg Intravenous Given 05/24/13 2141)  famotidine (PEPCID) IVPB 20 mg (0 mg Intravenous Stopped 05/24/13 2215)  albuterol (PROVENTIL) (2.5 MG/3ML) 0.083% nebulizer solution 5 mg (5 mg Nebulization Given 05/24/13 2220)  ipratropium (ATROVENT) nebulizer solution 0.5 mg (0.5 mg Nebulization Given 05/24/13 2220)  EPINEPHrine (EPI-PEN) injection 0.3 mg (0.3 mg Intramuscular Given 05/24/13 2212)    DIAGNOSTIC STUDIES: Oxygen Saturation is 97% on RA, adequate by my interpretation.    COORDINATION OF CARE: 10:06  PM-Discussed treatment plan which includes EPI injection with pt at bedside and pt agreed to plan.   10:33 PM- Pt rechecked and feels improved after breathing treatment and other medications. She is getting another breathing treatment now.  00:00 Pt feels much better, her swelling in her throat is gone, her lungs are clear. Her rash is faded. Feels ready to go home.     EKG Interpretation   None       MDM   1. Allergic reaction, initial encounter   2. Urticarial rash   3. Angioedema, initial encounter      Discharge Medication List as of 05/25/2013 12:25 AM    START taking these medications   Details  EPINEPHrine (EPIPEN) 0.3 mg/0.3 mL SOAJ injection Inject 0.3 mLs (0.3 mg total) into the muscle as needed., Starting 05/25/2013, Until Discontinued, Print    famotidine (  PEPCID) 20 MG tablet Take 1 tablet (20 mg total) by mouth 2 (two) times daily., Starting 05/25/2013, Until Discontinued, Print    predniSONE (DELTASONE) 20 MG tablet Take 3 po QD x 2d starting tomorrow, then 2 po QD x 3d then 1 po QD x 3d, Print        Plan discharge  Devoria Albe, MD, FACEP   I personally performed the services described in this documentation, which was scribed in my presence. The recorded information has been reviewed and considered.  Devoria Albe, MD, Armando Gang    Ward Givens, MD 05/25/13 [phone]

## 2013-05-24 NOTE — ED Notes (Signed)
Pt with hives, itchy throat and constant clearing of throat, states that throat feels tight

## 2013-05-24 NOTE — ED Notes (Signed)
Hives onset after eating crabs last night, today increasing hives and sob started one hour ago

## 2013-05-25 MED ORDER — FAMOTIDINE 20 MG PO TABS: 20.0000 mg | ORAL_TABLET | Freq: Two times a day (BID) | ORAL | Status: DC

## 2013-05-25 MED ORDER — PREDNISONE 20 MG PO TABS: ORAL_TABLET | ORAL | Status: DC

## 2013-05-25 MED ORDER — EPINEPHRINE 0.3 MG/0.3ML IJ SOAJ: 0.3000 mg | INTRAMUSCULAR | Status: DC | PRN

## 2013-05-25 NOTE — Discharge Instructions (Signed)
Avoid heat, heat will make your rash get worse. Take benadryl 50 mg every 4-6 hrs for rash, itching or swelling. Take the prednisone and pepcid until gone. Use the epipen if you feel like you are getting swelling in your throat or have restricted breathing, then go to the nearest ED. AVOID THE CRAB DIP YOU ATE BEFORE THESE SYMPTOMS STARTED. BE CAREFUL WITH SEAFOOD IN THE FUTURE.    Epinephrine Injection Epinephrine is a medicine given by injection to temporarily treat an emergency allergic reaction. It is also used to treat severe asthmatic attacks and other lung problems. The medicine helps to enlarge (dilate) the small breathing tubes of the lungs. A life-threatening, sudden allergic reaction that involves the whole body is called anaphylaxis. Because of potential side effects, epinephrine should only be used as directed by your caregiver. RISKS AND COMPLICATIONS Possible side effects of epinephrine injections include:  Chest pain.  Irregular or rapid heartbeat.  Shortness of breath.  Nausea.  Vomiting.  Abdominal pain or cramping.  Sweating.  Dizziness.  Weakness.  Headache.  Nervousness. Report all side effects to your caregiver. HOW TO GIVE AN EPINEPHRINE INJECTION Give the epinephrine injection immediately when symptoms of a severe reaction begin. Inject the medicine into the outer thigh or any available, large muscle. Your caregiver can teach you how to do this. You do not need to remove any clothing. After the injection, call your local emergency services (911 in [place] Even if you improve after the injection, you need to be examined at a hospital emergency department. Epinephrine works quickly, but it also wears off quickly. Delayed reactions can occur. A delayed reaction may be as serious and dangerous as the initial reaction. HOME CARE INSTRUCTIONS  Make sure you and your family know how to give an epinephrine injection.  Use epinephrine injections as directed by your  caregiver. Do not use this medicine more often or in larger doses than prescribed.  Always carry your epinephrine injection or anaphylaxis kit with you. This can be lifesaving if you have a severe reaction.  Store the medicine in a cool, dry place. If the medicine becomes discolored or cloudy, dispose of it properly and replace it with new medicine.  Check the expiration date on your medicine. It may be unsafe to use medicines past their expiration date.  Tell your caregiver about any other medicines you are taking. Some medicines can react badly with epinephrine.  Tell your caregiver about any medical conditions you have, such as diabetes, high blood pressure (hypertension), heart disease, irregular heartbeats, or if you are pregnant. SEEK IMMEDIATE MEDICAL CARE IF:  You have used an epinephrine injection. Call your local emergency services (911 in [place] Even if you improve after the injection, you need to be examined at a hospital emergency department to make sure your allergic reaction is under control. You will also be monitored for adverse effects from the medicine.  You have chest pain.  You have irregular or fast heartbeats.  You have shortness of breath.  You have severe headaches.  You have severe nausea, vomiting, or abdominal cramps.  You have severe pain, swelling, or redness in the area where you gave the injection. Document Released: 05/04/2000 Document Revised: 07/30/2011 Document Reviewed: 01/24/2011 [facility] Patient Information 2014 [place], [place].  Food Allergy A food allergy occurs from eating something you are sensitive to. Food allergies occur in all age groups. It may be passed to you from your parents (heredity).  CAUSES  Some common causes are cow's  milk, seafood, eggs, nuts (including peanut butter), wheat, and soybeans. SYMPTOMS  Common problems are:   Swelling around the mouth.  An itchy, red rash.  Hives.  Vomiting.  Diarrhea. Severe allergic  reactions are life-threatening. This reaction is called anaphylaxis. It can cause the mouth and throat to swell. This makes it hard to breathe and swallow. In severe reactions, only a small amount of food may be fatal within seconds. HOME CARE INSTRUCTIONS   If you are unsure what caused the reaction, keep a diary of foods eaten and symptoms that followed. Avoid foods that cause reactions.  If hives or rash are present:  Take medicines as directed.  Use an over-the-counter antihistamine (diphenhydramine) to treat hives and itching as needed.  Apply cold compresses to the skin or take baths in cool water. Avoid hot baths or showers. These will increase the redness and itching.  If you are severely allergic:  Hospitalization is often required following a severe reaction.  Wear a medical alert bracelet or necklace that describes the allergy.  Carry your anaphylaxis kit or epinephrine injection with you at all times. Both you and your family members should know how to use this. This can be lifesaving if you have a severe reaction. If epinephrine is used, it is important for you to seek immediate medical care or call your local emergency services (911 in [place] When the epinephrine wears off, it can be followed by a delayed reaction, which can be fatal.  Replace your epinephrine immediately after use in case of another reaction.  Ask your caregiver for instructions if you have not been taught how to use an epinephrine injection.  Do not drive until medicines used to treat the reaction have worn off, unless approved by your caregiver. SEEK MEDICAL CARE IF:   You suspect a food allergy. Symptoms generally happen within 30 minutes of eating a food.  Your symptoms have not gone away within 2 days. See your caregiver sooner if symptoms are getting worse.  You develop new symptoms.  You want to retest yourself with a food or drink you think causes an allergic reaction. Never do this if an  anaphylactic reaction to that food or drink has happened before.  There is a return of the symptoms which brought you to your caregiver. SEEK IMMEDIATE MEDICAL CARE IF:   You have trouble breathing, are wheezing, or you have a tight feeling in your chest or throat.  You have a swollen mouth, or you have hives, swelling, or itching all over your body. Use your epinephrine injection immediately. This is given into the outside of your thigh, deep into the muscle. Following use of the epinephrine injection, seek help right away. Seek immediate medical care or call your local emergency services (911 in [place] MAKE SURE YOU:   Understand these instructions.  Will watch your condition.  Will get help right away if you are not doing well or get worse. Document Released: 05/04/2000 Document Revised: 07/30/2011 Document Reviewed: 12/25/2007 [facility] Patient Information 2014 [place].  Seafood Allergy  Seafood allergies are usually a life-long problem. People are usually only allergic to one seafood group. Seafood allergy does not increase the risk of iodine allergy. Some conditions (scombroid fish poisoning and [place] allergy) may seem like allergic reactions to seafood, but are separate conditions. Bad reactions may also occur after eating seafood infected or tainted by algae-derived neurotoxins (ciguatera and paralytic shellfish poisoning). SYMPTOMS  Many allergic reactions to food are mild. Mild symptoms may  be limited to hives or swelling in one area. The most dangerous symptoms are:  Breathing difficulties. This may occur from breathing in seafood allergen fumes when food is being cooked or in seafood processing factories.  A drop in blood pressure (shock).  Anaphylaxis is a severe whole body reaction. This is the most severe form of allergic reaction. Other symptoms include:   Swelling of the face or throat.  Dizziness.  Difficulty thinking.  Intense sense of  fear.  Tightness in the chest.  Vomiting.  Diarrhea. TYPES OF SEAFOOD There are many types of seafood. The major groups of sea life that trigger allergic reactions are:  VERTEBRATES  Scaly fish (salmon, cod, mackerel, sardines, herring, anchovies, tuna, trout, haddock, [place]).  INVERTEBRATES  Crustaceans (prawns/shrimps, lobster, crab, crayfish, yabbies).  Mollusks.  Shellfish (clams, mussels, oysters, scallops).  Cephalopods (octopus, cuttlefish, squid, calamari).  Gastropods (sea slugs, garden slugs, snails). As a rule, patients allergic to one group of seafood can usually tolerate those from another. Seafood allergy is most common in communities where seafood is an important part of the diet, such as [place] and [place]. Sensitivity is more common in adults than children.  Occasionally, intense cooking will partially or completely destroy the triggering allergen. This may explain why some patients allergic to fresh fish are able to tolerate salmon or tuna in a can. AVOIDING THE ALLERGEN IS AN IMPORTANT PART OF MANAGEMENT. Complete avoidance of one or more groups of seafood is often advised. It may be difficult to achieve in practice. Accidental exposure is more likely to occur when eating away from home. This is most true when eating at seafood restaurants. OTHER POTENTIAL SOURCES OF ACCIDENTAL EXPOSURE AND CROSS-CONTAMINATION INCLUDE:  Seafood platters (best avoided).  Asian foods in which shellfish can be a common ingredient or contaminant (prawns in fried rice or soups).  Food may be rolled in the same batter or cooked in the same oil as seafood (take-out fish and chips).  Anchovies (fish) in Caesar salads and as an ingredient, or Worcestershire sauce.  Contaminated barbecues.  Fish extracts are also occasionally used to remove particulate matter from some beverages such as wine and beer. This process is called "fining." SEAFOOD ALLERGY AND IODINE ALLERGY ARE  UNRELATED. Even though seafood is a rich source of natural iodine, allergic reactions to seafood proteins have a different mechanism to that of iodine. Iodine can be found in topical antiseptics and x-ray contrast agents. Patients allergic to seafood are not at an increased risk of allergic reactions to iodine. Those with iodine allergy are not at increased risk of seafood allergy. SEEK IMMEDIATE MEDICAL CARE IF:  You have difficulty breathing, or you are wheezing or have a tight feeling in your chest or throat.  You have a swollen mouth, or have hives, swelling or itching over your body.  You feel faint or pass out.  You develop chest pain or a worsening of the problems which originally caused you to seek medical help. If you have eaten seafood and develop problems or symptoms that seem unusual for you, seek advice from your caregiver. If the problems are severe, call your local emergency medical service. Document Released: 10/27/2001 Document Revised: 07/30/2011 Document Reviewed: 12/19/2007 [facility] Patient Information 2014 [place].

## 2013-08-24 ENCOUNTER — Encounter (HOSPITAL_COMMUNITY): Payer: Self-pay | Admitting: Emergency Medicine

## 2013-08-24 ENCOUNTER — Emergency Department (HOSPITAL_COMMUNITY)
Admission: EM | Admit: 2013-08-24 | Discharge: 2013-08-24 | Disposition: A | Discharge status: Home / Self Care | Payer: BC Managed Care – PPO | Attending: Emergency Medicine | Admitting: Emergency Medicine

## 2013-08-24 DIAGNOSIS — H6690 Otitis media, unspecified, unspecified ear: Secondary | ICD-10-CM | POA: Insufficient documentation

## 2013-08-24 DIAGNOSIS — H6691 Otitis media, unspecified, right ear: Secondary | ICD-10-CM

## 2013-08-24 DIAGNOSIS — Z8679 Personal history of other diseases of the circulatory system: Secondary | ICD-10-CM | POA: Insufficient documentation

## 2013-08-24 DIAGNOSIS — J3489 Other specified disorders of nose and nasal sinuses: Secondary | ICD-10-CM | POA: Insufficient documentation

## 2013-08-24 MED ORDER — AMOXICILLIN 500 MG PO CAPS: 500.0000 mg | ORAL_CAPSULE | Freq: Three times a day (TID) | ORAL | Status: DC

## 2013-08-24 MED ORDER — ANTIPYRINE-BENZOCAINE 5.4-1.4 % OT SOLN: 3.0000 [drp] | OTIC | Status: DC | PRN

## 2013-08-24 NOTE — ED Provider Notes (Signed)
CSN: [id number]     Arrival date & time 08/24/13  1003 History   First MD Initiated Contact with Patient 08/24/13 1006     Chief Complaint  Patient presents with  . Otalgia     (Consider location/radiation/quality/duration/timing/severity/associated sxs/prior Treatment) HPI Comments: Patient is a 23 year old female past medical history significant for migraines presented to the emergency department for right otalgia that began 2 days ago. Patient states she has had precipitating nasal congestion and rhinorrhea for a few days prior to onset of ear pain. She states she has tried to take ibuprofen and Tylenol with minimal improvement of her symptoms. She denies any fevers, ear drainage. She has not taken any antibiotics for otitis media infections the last month  Patient is a 23 y.o. female presenting with ear pain.  Otalgia Associated symptoms: no ear discharge and no fever     Past Medical History  Diagnosis Date  . No pertinent past medical history   . Migraine   . Obesity   . Hx of varicella   . Pregnancy induced hypertension    Past Surgical History  Procedure Laterality Date  . Cholecystectomy    . Wisdom tooth extraction     Family History  Problem Relation Age of Onset  . Anesthesia problems Neg Hx   . Diabetes Mother   . Hypertension Mother   . Urolithiasis Mother   . Migraines Mother   . Hypertension Father   . Hypertension Maternal Grandmother   . Hypertension Maternal Grandfather   . Hypertension Paternal Grandmother   . Hypertension Paternal Grandfather   . Lupus Maternal Aunt   . Rheum arthritis Maternal Aunt   . Diabetes Cousin    History  Substance Use Topics  . Smoking status: Never Smoker   . Smokeless tobacco: Never Used  . Alcohol Use: No     Comment: occasionally   OB History   Grav Para Term Preterm Abortions TAB SAB Ect Mult Living   2 1 1  1  1   1      Review of Systems  Constitutional: Negative for fever and chills.  HENT: Positive for  ear pain. Negative for ear discharge.   All other systems reviewed and are negative.      Allergies  Shellfish allergy  Home Medications   Current Outpatient Rx  Name  Route  Sig  Dispense  Refill  . amoxicillin (AMOXIL) 500 MG capsule   Oral   Take 1 capsule (500 mg total) by mouth 3 (three) times daily.   30 capsule   0   . antipyrine-benzocaine (AURALGAN) otic solution   Right Ear   Place 3-4 drops into the right ear every 2 (two) hours as needed for ear pain.   10 mL   0   . EPINEPHrine (EPIPEN) 0.3 mg/0.3 mL SOAJ injection   Intramuscular   Inject 0.3 mLs (0.3 mg total) into the muscle as needed.   2 Device   0    BP 143/78  Pulse 73  Temp(Src) 98.1 F (36.7 C) (Oral)  Resp 16  SpO2 100%  LMP 08/17/2013 Physical Exam  Nursing note and vitals reviewed. Constitutional: She is oriented to person, place, and time. She appears well-developed and well-nourished. No distress.  HENT:  Head: Normocephalic and atraumatic.  Right Ear: Hearing, external ear and ear canal normal. No mastoid tenderness. Tympanic membrane is erythematous (Erythematous TM without light reflex).  Left Ear: Hearing, tympanic membrane, external ear and ear canal normal.  Nose: Rhinorrhea present.  Mouth/Throat: Uvula is midline, oropharynx is clear and moist and mucous membranes are normal.  Eyes: Conjunctivae are normal.  Neck: Normal range of motion. Neck supple.  Cardiovascular: Normal rate.   Pulmonary/Chest: Effort normal.  Abdominal: Soft.  Musculoskeletal: Normal range of motion.  Neurological: She is alert and oriented to person, place, and time.  Skin: Skin is warm and dry. She is not diaphoretic.  Psychiatric: She has a normal mood and affect.    ED Course  Procedures (including critical care time) Medications - No data to display  Labs Review Labs Reviewed - No data to display Imaging Review No results found.   EKG Interpretation None      MDM   Final  diagnoses:  Right otitis media    Filed Vitals:   08/24/13 1007  BP: 143/78  Pulse: 73  Temp: 98.1 F (36.7 C)  Resp: 16    Afebrile, NAD, non-toxic appearing, AAOx4.  Patient presents with otalgia and exam consistent with acute otitis media. No concern for acute mastoiditis, meningitis.  No antibiotic use in the last month.  Patient discharged home with Amoxicillin.  Advised parents to call PCP today for follow-up.  I have also discussed reasons to return immediately to the ER.  Patient expresses understanding and agrees with plan.Patient is stable at time of discharge.         Jeannetta Ellis, PA-C 08/24/13 1412

## 2013-08-24 NOTE — Discharge Instructions (Signed)
Please follow up with your primary care physician in 1-2 days. If you do not have one please call the [facility] number listed above. Please take your antibiotic until completion. Please read all discharge instructions and return precautions.  ° °Otitis Media, Adult °Otitis media is redness, soreness, and swelling (inflammation) of the middle ear. Otitis media may be caused by allergies or, most commonly, by infection. Often it occurs as a complication of the common cold. °SIGNS AND SYMPTOMS °Symptoms of otitis media may include: °· Earache. °· Fever. °· Ringing in your ear. °· Headache. °· Leakage of fluid from the ear. °DIAGNOSIS °To diagnose otitis media, your health care provider will examine your ear with an otoscope. This is an instrument that allows your health care provider to see into your ear in order to examine your eardrum. Your health care provider also will ask you questions about your symptoms. °TREATMENT  °Typically, otitis media resolves on its own within 3 5 days. Your health care provider may prescribe medicine to ease your symptoms of pain. If otitis media does not resolve within 5 days or is recurrent, your health care provider may prescribe antibiotic medicines if he or she suspects that a bacterial infection is the cause. °HOME CARE INSTRUCTIONS  °· Take your medicine as directed until it is gone, even if you feel better after the first few days. °· Only take over-the-counter or prescription medicines for pain, discomfort, or fever as directed by your health care provider. °· Follow up with your health care provider as directed. °SEEK MEDICAL CARE IF: °· You have otitis media only in one ear or bleeding from your nose or both. °· You notice a lump on your neck. °· You are not getting better in 3 5 days. °· You feel worse instead of better. °SEEK IMMEDIATE MEDICAL CARE IF:  °· You have pain that is not controlled with medicine. °· You have swelling, redness, or pain around  your ear or stiffness in your neck. °· You notice that part of your face is paralyzed. °· You notice that the bone behind your ear (mastoid) is tender when you touch it. °MAKE SURE YOU:  °· Understand these instructions. °· Will watch your condition. °· Will get help right away if you are not doing well or get worse. °Document Released: 02/10/2004 Document Revised: 02/25/2013 Document Reviewed: 12/02/2012 °ExitCare® Patient Information ©2014 [employer]. ° ° ° °

## 2013-08-24 NOTE — Progress Notes (Signed)
   CARE MANAGEMENT ED NOTE 08/24/2013  Patient:  Rose Manning,Rose Manning   Account Number:  [id number]  Date Initiated:  08/24/2013  Documentation initiated by:  Edd ArbourGIBBS,Haik Mahoney  Subjective/Objective Assessment:   23 yr old female with [employer] p[place] of [place] coverage active Reports she has recently moved to [place]  c/o right ear has a lot of pain and pressure     Subjective/Objective Assessment Detail:   Pt noted to be holding right ear as ED CM entered the room  ED dx acute otitis media rx for amoxicillin provided by ED PA     Action/Plan:   ED Cm spoke with pt when noted no pcp listed for [employer] PPO-provided pt with a list of in network [employer]bs ppo providers in [place] and 25 radius to assist with provider for f/u care   Action/Plan Detail:   CM spoke with triage nursing staff about pt concern with being seen and having to go to pick up "my child" Updated pt after speaking with Ed staff  ED CM placed f/u information in f/u section of discharge f/u section   Anticipated DC Date:  08/24/2013     Status Recommendation to Physician:   Result of Recommendation:    Other ED Services  Consult Working Plan    DC Planning Services  Other  PCP issues  Outpatient Services - Pt will follow up    Choice offered to / List presented to:            Status of service:  Completed, signed off  ED Comments:   ED Comments Detail:

## 2013-08-24 NOTE — ED Provider Notes (Signed)
Medical screening examination/treatment/procedure(s) were performed by non-physician practitioner and as supervising physician I was immediately available for consultation/collaboration.   EKG Interpretation None        Richardean Canalavid H Nicolaus Andel, MD 08/24/13 [phone]

## 2013-08-24 NOTE — ED Notes (Signed)
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

## 2013-09-30 ENCOUNTER — Other Ambulatory Visit (INDEPENDENT_AMBULATORY_CARE_PROVIDER_SITE_OTHER): Payer: Self-pay | Admitting: Surgery

## 2013-09-30 ENCOUNTER — Encounter (INDEPENDENT_AMBULATORY_CARE_PROVIDER_SITE_OTHER): Payer: Self-pay | Admitting: Surgery

## 2013-09-30 ENCOUNTER — Other Ambulatory Visit (INDEPENDENT_AMBULATORY_CARE_PROVIDER_SITE_OTHER): Payer: Self-pay

## 2013-09-30 ENCOUNTER — Ambulatory Visit (INDEPENDENT_AMBULATORY_CARE_PROVIDER_SITE_OTHER): Payer: BC Managed Care – PPO | Admitting: Surgery

## 2013-09-30 NOTE — Progress Notes (Signed)
Re:   Rose LangoStephanie Manning DOB:   [birth year] MRN:   [id number]  ASSESSMENT AND PLAN: 1.  Morbid obesity  Initial weight - 341, BMI - 56.8  Per the 1991 NIH Consensus Statement, the patient is a candidate for bariatric surgery.  The patient attended our initial information session and reviewed the types of bariatric surgery.    The patient is interested in the Roux en Y Gastric Bypass.  I discussed with the patient the indications and risks of bariatric surgery.  The potential risks of surgery include, but are not limited to, bleeding, infection, leak from the bowel, DVT and PE, open surgery, long term nutrition consequences, and death.  The patient understands the importance of compliance and long term follow-up with our group after surgery.  Both her mother and step mother have had gastric bypass surgery.  From here we will obtain lab tests, x-rays, nutrition consult, and psych consult.  2.  History of gestational of diabetes  Chief Complaint  Patient presents with  . Bariatric Pre-op    RNY   REFERRING PHYSICIAN: Eartha InchBADGER,MICHAEL C, MD  HISTORY OF PRESENT ILLNESS: Rose Manning is a 23 y.o. (DOB: [birth year])  white  female whose primary care physician is Eartha InchBADGER,MICHAEL C, MD ([facility]) and comes to me today for weight loss surgery.  She heard Dr. Gaynelle AduEric Wilson at the information session. Both her mother and her stepmother have had bypass surgery. Her mother [place] gastric [place] [facility]. Her stepmother, Rose Manning, had her surgery here.  Ms. Rose SanesBosher has tried multiple diets to lose weight, even starting when she was in middle school. She has tried Weight Watchers, Atkins type diet, and Slim fast.  She has not been on any medicines for weight loss.  She understands the surgery well.  Her mother (?) has had headaches after the surgery, but there is no evidence these are related to surgery.    Past Medical History  Diagnosis Date  . No pertinent  past medical history   . Migraine   . Obesity   . Hx of varicella   . Pregnancy induced hypertension       Past Surgical History  Procedure Laterality Date  . Wisdom tooth extraction    . Cholecystectomy  01/2009      Current Outpatient Prescriptions  Medication Sig Dispense Refill  . EPINEPHrine (EPIPEN) 0.3 mg/0.3 mL SOAJ injection Inject 0.3 mLs (0.3 mg total) into the muscle as needed.  2 Device  0  . amoxicillin (AMOXIL) 500 MG capsule Take 1 capsule (500 mg total) by mouth 3 (three) times daily.  30 capsule  0  . antipyrine-benzocaine (AURALGAN) otic solution Place 3-4 drops into the right ear every 2 (two) hours as needed for ear pain.  10 mL  0   No current facility-administered medications for this visit.      Allergies  Allergen Reactions  . Shellfish Allergy Hives   REVIEW OF SYSTEMS: Skin:  No history of rash.  No history of abnormal moles. Infection:  No history of hepatitis or HIV.  No history of MRSA. Neurologic:  No history of stroke.  No history of seizure.  No history of headaches. Cardiac:  Hypertension during pregnancy, not on meds now. No history of heart disease.  No history of prior cardiac catheterization.  No history of seeing a cardiologist. Pulmonary:  Does not smoke cigarettes.  No asthma or bronchitis.  No OSA/CPAP.  Endocrine:  She had  gestational diabetes, okay now. No thyroid disease. Gastrointestinal:  No history of stomach disease.  No history of liver disease.  History of cholecystectomy - 2010 - [place].  No history of pancreas disease.  No history of colon disease. Urologic:  No history of kidney stones.  No history of bladder infections. GYN:  Has 506 month old girl. Saw [facility]. Musculoskeletal:  Has back, knee, and ankle pain.  Is not seeing ortoh regularly. Hematologic:  No bleeding disorder.  No history of anemia.  Not anticoagulated. Psycho-social:  The patient is oriented.   The patient has no obvious  psychologic or social impairment to understanding our conversation and plan.  SOCIAL and FAMILY HISTORY: Married. 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  PHYSICAL EXAM: BP 134/82  Pulse 86  Temp(Src) 98 F (36.7 C)  Resp 18  Ht 5\' 5"  (1.651 m)  Wt 341 lb 3.2 oz (154.767 kg)  BMI 56.78 kg/m2  General: Obese WF who is alert and generally healthy appearing.  HEENT: Normal. Pupils equal. Neck: Supple. No mass.  No thyroid mass. Lymph Nodes:  No supraclavicular or cervical nodes. Lungs: Clear to auscultation and symmetric breath sounds. Heart:  RRR. No murmur or rub. Abdomen: Soft. No mass. No tenderness. No hernia. She is more pear than apple. Rectal: Not done. Extremities:  Good strength and ROM  in upper and lower extremities.  She has multiple tattoos.  Snake on right leg. Neurologic:  Grossly intact to motor and sensory function. Psychiatric: Has normal mood and affect. Behavior is normal.   DATA REVIEWED: Epic notes reviewed.  Ovidio Kinavid Aarvi Stotts, MD,  [facility] [address]  [address]   [place], [place]    [id number] Phone:  [phone] FAX:  [phone]

## 2013-10-23 ENCOUNTER — Ambulatory Visit (HOSPITAL_COMMUNITY): Admission: RE | Admit: 2013-10-23 | Payer: BC Managed Care – PPO | Source: Ambulatory Visit | Admitting: Surgery

## 2013-10-23 SURGERY — BREATH TEST, FOR HELICOBACTER PYLORI

## 2013-10-24 ENCOUNTER — Ambulatory Visit: Payer: BC Managed Care – PPO | Admitting: Dietician

## 2013-11-06 ENCOUNTER — Ambulatory Visit (HOSPITAL_COMMUNITY): Admission: RE | Admit: 2013-11-06 | Payer: BC Managed Care – PPO | Source: Ambulatory Visit

## 2013-12-02 ENCOUNTER — Encounter (HOSPITAL_COMMUNITY): Payer: Self-pay | Admitting: *Deleted

## 2013-12-02 ENCOUNTER — Inpatient Hospital Stay (HOSPITAL_COMMUNITY)
Admission: AD | Admit: 2013-12-02 | Discharge: 2013-12-02 | Disposition: A | Discharge status: Home / Self Care | Payer: BC Managed Care – PPO | Source: Ambulatory Visit | Attending: Obstetrics & Gynecology | Admitting: Obstetrics & Gynecology

## 2013-12-02 DIAGNOSIS — Z3202 Encounter for pregnancy test, result negative: Secondary | ICD-10-CM

## 2013-12-02 DIAGNOSIS — R109 Unspecified abdominal pain: Secondary | ICD-10-CM | POA: Insufficient documentation

## 2013-12-02 LAB — URINALYSIS, ROUTINE W REFLEX MICROSCOPIC
Bilirubin Urine: NEGATIVE
Glucose, UA: NEGATIVE mg/dL
Hgb urine dipstick: NEGATIVE
Ketones, ur: NEGATIVE mg/dL
LEUKOCYTES UA: NEGATIVE
NITRITE: NEGATIVE
PH: 6 (ref 5.0–8.0)
Protein, ur: NEGATIVE mg/dL
Specific Gravity, Urine: 1.025 (ref 1.005–1.030)
Urobilinogen, UA: 1 mg/dL (ref 0.0–1.0)

## 2013-12-02 LAB — POCT PREGNANCY, URINE: PREG TEST UR: NEGATIVE

## 2013-12-02 LAB — HCG, QUANTITATIVE, PREGNANCY: hCG, Beta Chain, Quant, S: <1 m[IU]/mL (ref <5)

## 2013-12-02 NOTE — MAU Provider Note (Signed)
First Provider Initiated Contact with Patient 12/02/13 1806      Chief Complaint:  Abdominal Pain   Rose Manning is  23 y.o. G2P1011.  Patient's last menstrual period was 09/16/2013. She presents complaining of 3 day insidious onset  moderate to severe crampy to achy left sided lower abdominal pain that is consistent with her normal menstrual pain. She notes associated lability that is unusual for her and not assoiciated with stressful life events nor medication. She also notes nausea without vomiting x 1 day. She notes no aggravating nor alleviating factors with her pain and has not taken medication to reduce her symptoms. Two weeks prior to today's visit she had similar pain of three days duration that resolved on its own and was not followed by menstruation. She states that two consecutive home pregnancy test strips had a nonfunctioning control strip and is concerned that she may be pregnant. She currently has one sexual partner with whom she infrequently uses condoms. Pregnancy is currently unwanted. Menstrual cycle is irregular since the birth of her last child ranging from 26-34 days.   Denies; fever, chills, dysuria, hematuria, vaginal discharge, vaginal bleeding.  No previous history of kidney stones, ovarian cysts or torsion.    OB History   Grav Para Term Preterm Abortions TAB SAB Ect Mult Living   2 1 1  1  1   1        Past Medical History  Diagnosis Date  . No pertinent past medical history   . Migraine   . Obesity   . Hx of varicella   . Pregnancy induced hypertension     Past Surgical History  Procedure Laterality Date  . Wisdom tooth extraction    . Cholecystectomy  01/2009    Family History  Problem Relation Age of Onset  . Anesthesia problems Neg Hx   . Hypertension Mother   . Urolithiasis Mother   . Migraines Mother   . Hypertension Father   . Hypertension Maternal Grandmother   . Hypertension Maternal Grandfather   . Alzheimer's disease Maternal  Grandfather   . Hypertension Paternal Grandmother   . Hypertension Paternal Grandfather   . Heart disease Paternal Grandfather   . Lupus Maternal Aunt   . Rheum arthritis Maternal Aunt   . Diabetes Cousin     History  Substance Use Topics  . Smoking status: Never Smoker   . Smokeless tobacco: Never Used  . Alcohol Use: No     Comment: occasionally    Allergies:  Allergies  Allergen Reactions  . Shellfish Allergy Hives    Prescriptions prior to admission  Medication Sig Dispense Refill  . ibuprofen (ADVIL,MOTRIN) 800 MG tablet Take 800 mg by mouth daily as needed for headache.      Marland Kitchen EPINEPHrine (EPIPEN) 0.3 mg/0.3 mL SOAJ injection Inject 0.3 mLs (0.3 mg total) into the muscle as needed.  2 Device  0     Review of Systems Per HPI   Physical Exam   Blood pressure 125/65, pulse 84, temperature 98.1 F (36.7 C), temperature source Oral, resp. rate 18, height 5\' 5"  (1.651 m), weight 340 lb 12.8 oz (154.586 kg), last menstrual period 09/16/2013.  General: General appearance - alert, well appearing, and in no distress, BMI 56 Chest - clear to auscultation, no wheezes, rales or rhonchi, symmetric air entry, no tachypnea, retractions or cyanosis Heart - normal rate, regular rhythm, normal S1, S2, no murmurs, rubs, clicks or gallops Abdomen - soft, nondistended, mild tenderness with deep  palpation of LLQ, bowel sounds diminished but limited by body habitus, no bladder distension noted, no CVA tenderness Pelvic - exam declined by the patient Extremities - no pedal edema noted, feet normal, good pulses, normal color, temperature and sensation   Labs: Results for orders placed during the hospital encounter of 12/02/13 (from the past 24 hour(s))  URINALYSIS, ROUTINE W REFLEX MICROSCOPIC   Collection Time    12/02/13  4:54 PM      Result Value Ref Range   Color, Urine YELLOW  YELLOW   APPearance CLEAR  CLEAR   Specific Gravity, Urine 1.025  1.005 - 1.030   pH 6.0  5.0 - 8.0    Glucose, UA NEGATIVE  NEGATIVE mg/dL   Hgb urine dipstick NEGATIVE  NEGATIVE   Bilirubin Urine NEGATIVE  NEGATIVE   Ketones, ur NEGATIVE  NEGATIVE mg/dL   Protein, ur NEGATIVE  NEGATIVE mg/dL   Urobilinogen, UA 1.0  0.0 - 1.0 mg/dL   Nitrite NEGATIVE  NEGATIVE   Leukocytes, UA NEGATIVE  NEGATIVE  POCT PREGNANCY, URINE   Collection Time    12/02/13  4:59 PM      Result Value Ref Range   Preg Test, Ur NEGATIVE  NEGATIVE     Assessment: No signs of acute abdomen, negative b-HCG and Urine Pregnancy Test.   Plan: Discussed contraception options and urged patient to establish PCP.  Patient is interested in Nexplanon.  Message sent to clinic to schedule  I have seen the patient with the resident/student and agree with the above.  Hyman HopesHogan, Jourdan Durbin Donovan  Jeremy M Nicoloff, Student-PA

## 2013-12-02 NOTE — Discharge Instructions (Signed)
Contraception Choices Contraception (birth control) is the use of any methods or devices to prevent pregnancy. Below are some methods to help avoid pregnancy. HORMONAL METHODS   Contraceptive implant. This is a thin, plastic tube containing progesterone hormone. It does not contain estrogen hormone. Your health care provider inserts the tube in the inner part of the upper arm. The tube can remain in place for up to 3 years. After 3 years, the implant must be removed. The implant prevents the ovaries from releasing an egg (ovulation), thickens the cervical mucus to prevent sperm from entering the uterus, and thins the lining of the inside of the uterus.  Progesterone-only injections. These injections are given every 3 months by your health care provider to prevent pregnancy. This synthetic progesterone hormone stops the ovaries from releasing eggs. It also thickens cervical mucus and changes the uterine lining. This makes it harder for sperm to survive in the uterus.  Birth control pills. These pills contain estrogen and progesterone hormone. They work by preventing the ovaries from releasing eggs (ovulation). They also cause the cervical mucus to thicken, preventing the sperm from entering the uterus. Birth control pills are prescribed by a health care provider.Birth control pills can also be used to treat heavy periods.  Minipill. This type of birth control pill contains only the progesterone hormone. They are taken every day of each month and must be prescribed by your health care provider.  Birth control patch. The patch contains hormones similar to those in birth control pills. It must be changed once a week and is prescribed by a health care provider.  Vaginal ring. The ring contains hormones similar to those in birth control pills. It is left in the vagina for 3 weeks, removed for 1 week, and then a new one is put back in place. The patient must be comfortable inserting and removing the ring  from the vagina.A health care provider's prescription is necessary.  Emergency contraception. Emergency contraceptives prevent pregnancy after unprotected sexual intercourse. This pill can be taken right after sex or up to 5 days after unprotected sex. It is most effective the sooner you take the pills after having sexual intercourse. Most emergency contraceptive pills are available without a prescription. Check with your pharmacist. Do not use emergency contraception as your only form of birth control. BARRIER METHODS   Female condom. This is a thin sheath (latex or rubber) that is worn over the penis during sexual intercourse. It can be used with spermicide to increase effectiveness.  Female condom. This is a soft, loose-fitting sheath that is put into the vagina before sexual intercourse.  Diaphragm. This is a soft, latex, dome-shaped barrier that must be fitted by a health care provider. It is inserted into the vagina, along with a spermicidal jelly. It is inserted before intercourse. The diaphragm should be left in the vagina for 6 to 8 hours after intercourse.  Cervical cap. This is a round, soft, latex or plastic cup that fits over the cervix and must be fitted by a health care provider. The cap can be left in place for up to 48 hours after intercourse.  Sponge. This is a soft, circular piece of polyurethane foam. The sponge has spermicide in it. It is inserted into the vagina after wetting it and before sexual intercourse.  Spermicides. These are chemicals that kill or block sperm from entering the cervix and uterus. They come in the form of creams, jellies, suppositories, foam, or tablets. They do not require a   prescription. They are inserted into the vagina with an applicator before having sexual intercourse. The process must be repeated every time you have sexual intercourse. INTRAUTERINE CONTRACEPTION  Intrauterine device (IUD). This is a T-shaped device that is put in a woman's uterus  during a menstrual period to prevent pregnancy. There are 2 types:  Copper IUD. This type of IUD is wrapped in copper wire and is placed inside the uterus. Copper makes the uterus and fallopian tubes produce a fluid that kills sperm. It can stay in place for 10 years.  Hormone IUD. This type of IUD contains the hormone progestin (synthetic progesterone). The hormone thickens the cervical mucus and prevents sperm from entering the uterus, and it also thins the uterine lining to prevent implantation of a fertilized egg. The hormone can weaken or kill the sperm that get into the uterus. It can stay in place for 3-5 years, depending on which type of IUD is used. PERMANENT METHODS OF CONTRACEPTION  Female tubal ligation. This is when the woman's fallopian tubes are surgically sealed, tied, or blocked to prevent the egg from traveling to the uterus.  Hysteroscopic sterilization. This involves placing a small coil or insert into each fallopian tube. Your doctor uses a technique called hysteroscopy to do the procedure. The device causes scar tissue to form. This results in permanent blockage of the fallopian tubes, so the sperm cannot fertilize the egg. It takes about 3 months after the procedure for the tubes to become blocked. You must use another form of birth control for these 3 months.  Female sterilization. This is when the female has the tubes that carry sperm tied off (vasectomy).This blocks sperm from entering the vagina during sexual intercourse. After the procedure, the man can still ejaculate fluid (semen). NATURAL PLANNING METHODS  Natural family planning. This is not having sexual intercourse or using a barrier method (condom, diaphragm, cervical cap) on days the woman could become pregnant.  Calendar method. This is keeping track of the length of each menstrual cycle and identifying when you are fertile.  Ovulation method. This is avoiding sexual intercourse during ovulation.  Symptothermal  method. This is avoiding sexual intercourse during ovulation, using a thermometer and ovulation symptoms.  Post-ovulation method. This is timing sexual intercourse after you have ovulated. Regardless of which type or method of contraception you choose, it is important that you use condoms to protect against the transmission of sexually transmitted infections (STIs). Talk with your health care provider about which form of contraception is most appropriate for you. Document Released: 05/07/2005 Document Revised: 05/12/2013 Document Reviewed: 10/30/2012 ExitCare Patient Information 2015 [employer]. This information is not intended to replace advice given to you by your health care provider. Make sure you discuss any questions you have with your health care provider.  

## 2013-12-02 NOTE — MAU Note (Signed)
Lower abd pain x 2 days, took 2 HPT's 4 days ago - pt states the results were unclear, lines did not develop.  Denies bleeding or discharge.

## 2014-03-22 ENCOUNTER — Encounter (HOSPITAL_COMMUNITY): Payer: Self-pay | Admitting: *Deleted

## 2014-04-22 ENCOUNTER — Inpatient Hospital Stay (HOSPITAL_COMMUNITY)
Admission: AD | Admit: 2014-04-22 | Discharge: 2014-04-22 | Disposition: A | Discharge status: Home / Self Care | Payer: Self-pay | Source: Ambulatory Visit | Attending: Obstetrics & Gynecology | Admitting: Obstetrics & Gynecology

## 2014-04-22 ENCOUNTER — Inpatient Hospital Stay (HOSPITAL_COMMUNITY): Payer: BC Managed Care – PPO

## 2014-04-22 ENCOUNTER — Encounter (HOSPITAL_COMMUNITY): Payer: Self-pay | Admitting: *Deleted

## 2014-04-22 ENCOUNTER — Other Ambulatory Visit (HOSPITAL_COMMUNITY): Payer: Self-pay | Admitting: Advanced Practice Midwife

## 2014-04-22 ENCOUNTER — Inpatient Hospital Stay (HOSPITAL_COMMUNITY): Payer: Self-pay

## 2014-04-22 DIAGNOSIS — O200 Threatened abortion: Secondary | ICD-10-CM | POA: Insufficient documentation

## 2014-04-22 DIAGNOSIS — O209 Hemorrhage in early pregnancy, unspecified: Secondary | ICD-10-CM

## 2014-04-22 DIAGNOSIS — Z3A00 Weeks of gestation of pregnancy not specified: Secondary | ICD-10-CM | POA: Insufficient documentation

## 2014-04-22 DIAGNOSIS — R58 Hemorrhage, not elsewhere classified: Secondary | ICD-10-CM

## 2014-04-22 LAB — CBC
HCT: 32.8 % — ABNORMAL LOW (ref 36.0–46.0)
Hemoglobin: 10.4 g/dL — ABNORMAL LOW (ref 12.0–15.0)
MCH: 23.7 pg — ABNORMAL LOW (ref 26.0–34.0)
MCHC: 31.7 g/dL (ref 30.0–36.0)
MCV: 74.9 fL — ABNORMAL LOW (ref 78.0–100.0)
Platelets: 248 10*3/uL (ref 150–400)
RBC: 4.38 MIL/uL (ref 3.87–5.11)
RDW: 16.4 % — AB (ref 11.5–15.5)
WBC: 9.2 10*3/uL (ref 4.0–10.5)

## 2014-04-22 LAB — URINALYSIS, ROUTINE W REFLEX MICROSCOPIC
Bilirubin Urine: NEGATIVE
GLUCOSE, UA: 100 mg/dL — AB
Ketones, ur: 15 mg/dL — AB
Nitrite: POSITIVE — AB
SPECIFIC GRAVITY, URINE: 1.02 (ref 1.005–1.030)
UROBILINOGEN UA: 2 mg/dL — AB (ref 0.0–1.0)
pH: 6.5 (ref 5.0–8.0)

## 2014-04-22 LAB — URINE MICROSCOPIC-ADD ON

## 2014-04-22 LAB — WET PREP, GENITAL
TRICH WET PREP: NONE SEEN
Yeast Wet Prep HPF POC: NONE SEEN

## 2014-04-22 LAB — HIV ANTIBODY (ROUTINE TESTING W REFLEX): HIV: NONREACTIVE

## 2014-04-22 LAB — POCT PREGNANCY, URINE: PREG TEST UR: POSITIVE — AB

## 2014-04-22 LAB — HCG, QUANTITATIVE, PREGNANCY: HCG, BETA CHAIN, QUANT, S: 269 m[IU]/mL — AB (ref <5)

## 2014-04-22 MED ORDER — CEPHALEXIN 500 MG PO CAPS: 500.0000 mg | ORAL_CAPSULE | Freq: Four times a day (QID) | ORAL | Status: DC

## 2014-04-22 NOTE — MAU Note (Addendum)
Bleeding really bad and having pain.  Bleeding started last wk, thought it was her period.  Passed a lg clot yesterday- was really hard- little bigger than a golfball.  Had a HPT 3wks ago that was positive

## 2014-04-22 NOTE — Discharge Instructions (Signed)
Pelvic Rest Pelvic rest is sometimes recommended for women when:   The placenta is partially or completely covering the opening of the cervix (placenta previa).  There is bleeding between the uterine wall and the amniotic sac in the first trimester (subchorionic hemorrhage).  The cervix begins to open without labor starting (incompetent cervix, cervical insufficiency).  The labor is too early (preterm labor). HOME CARE INSTRUCTIONS  Do not have sexual intercourse, stimulation, or an orgasm.  Do not use tampons, douche, or put anything in the vagina.  Do not lift anything over 10 pounds (4.5 kg).  Avoid strenuous activity or straining your pelvic muscles. SEEK MEDICAL CARE IF:  You have any vaginal bleeding during pregnancy. Treat this as a potential emergency.  You have cramping pain felt low in the stomach (stronger than menstrual cramps).  You notice vaginal discharge (watery, mucus, or bloody).  You have a low, dull backache.  There are regular contractions or uterine tightening. SEEK IMMEDIATE MEDICAL CARE IF: You have vaginal bleeding and have placenta previa.  Document Released: 09/01/2010 Document Revised: 07/30/2011 Document Reviewed: 09/01/2010 [facility] Patient Information 2015 [place]Care, [place]. This information is not intended to replace advice given to you by your health care provider. Make sure you discuss any questions you have with your health care provider.  Threatened Miscarriage A threatened miscarriage occurs when you have vaginal bleeding during your first 20 weeks of pregnancy but the pregnancy has not ended. If you have vaginal bleeding during this time, your health care provider will do tests to make sure you are still pregnant. If the tests show you are still pregnant and the developing baby (fetus) inside your womb (uterus) is still growing, your condition is considered a threatened miscarriage. A threatened miscarriage does not mean your pregnancy will  end, but it does increase the risk of losing your pregnancy (complete miscarriage). CAUSES  The cause of a threatened miscarriage is usually not known. If you go on to have a complete miscarriage, the most common cause is an abnormal number of chromosomes in the developing baby. Chromosomes are the structures inside cells that hold all your genetic material. Some causes of vaginal bleeding that do not result in miscarriage include:  Having sex.  Having an infection.  Normal hormone changes of pregnancy.  Bleeding that occurs when an egg implants in your uterus. RISK FACTORS Risk factors for bleeding in early pregnancy include:  Obesity.  Smoking.  Drinking excessive amounts of alcohol or caffeine.  Recreational drug use. SIGNS AND SYMPTOMS  Light vaginal bleeding.  Mild abdominal pain or cramps. DIAGNOSIS  If you have bleeding with or without abdominal pain before 20 weeks of pregnancy, your health care provider will do tests to check whether you are still pregnant. One important test involves using sound waves and a computer (ultrasound) to create images of the inside of your uterus. Other tests include an internal exam of your vagina and uterus (pelvic exam) and measurement of your baby's heart rate.  You may be diagnosed with a threatened miscarriage if:  Ultrasound testing shows you are still pregnant.  Your baby's heart rate is strong.  A pelvic exam shows that the opening between your uterus and your vagina (cervix) is closed.  Your heart rate and blood pressure are stable.  Blood tests confirm you are still pregnant. TREATMENT  No treatments have been shown to prevent a threatened miscarriage from going on to a complete miscarriage. However, the right home care is important.  HOME CARE  Make sure you keep all your appointments for prenatal care. This is very important. °· Get plenty of rest. °· Do not have sex or use tampons if you have vaginal  bleeding. °· Do not douche. °· Do not smoke or use recreational drugs. °· Do not drink alcohol. °· Avoid caffeine. °SEEK MEDICAL CARE IF: °· You have light vaginal bleeding or spotting while pregnant. °· You have abdominal pain or cramping. °· You have a fever. °SEEK IMMEDIATE MEDICAL CARE IF: °· You have heavy vaginal bleeding. °· You have blood clots coming from your vagina. °· You have severe low back pain or abdominal cramps. °· You have fever, chills, and severe abdominal pain. °MAKE SURE YOU: °· Understand these instructions. °· Will watch your condition. °· Will get help right away if you are not doing well or get worse. °Document Released: 05/07/2005 Document Revised: 05/12/2013 Document Reviewed: 03/03/2013 °ExitCare® Patient Information ©2015 ExitCare, LLC. This information is not intended to replace advice given to you by your health care provider. Make sure you discuss any questions you have with your health care provider. ° °

## 2014-04-22 NOTE — MAU Provider Note (Signed)
History     CSN: [id number]  Arrival date and time: 04/22/14 1123   None     Chief Complaint  Patient presents with  . Abdominal Pain  . Vaginal Bleeding   HPI This is a 23 y.o. female at Unknown EGA who presents with c/o heavy bleeding and pelvic pain which started last week.  Had a + UPT at home 3 weeks ago.   RN Note: Bleeding really bad and having pain. Bleeding started last wk, thought it was her period. Passed a lg clot yesterday- was really hard- little bigger than a golfball. Had a HPT 3wks ago that was positive          OB History    Gravida Para Term Preterm AB TAB SAB Ectopic Multiple Living   3 1 1  1  1   1       Past Medical History  Diagnosis Date  . No pertinent past medical history   . Migraine   . Obesity   . Hx of varicella   . Pregnancy induced hypertension     Past Surgical History  Procedure Laterality Date  . Wisdom tooth extraction    . Cholecystectomy  01/2009    Family History  Problem Relation Age of Onset  . Anesthesia problems Neg Hx   . Hypertension Mother   . Urolithiasis Mother   . Migraines Mother   . Hypertension Father   . Hypertension Maternal Grandmother   . Hypertension Maternal Grandfather   . Alzheimer's disease Maternal Grandfather   . Hypertension Paternal Grandmother   . Hypertension Paternal Grandfather   . Heart disease Paternal Grandfather   . Lupus Maternal Aunt   . Rheum arthritis Maternal Aunt   . Diabetes Cousin     History  Substance Use Topics  . Smoking status: Never Smoker   . Smokeless tobacco: Never Used  . Alcohol Use: No     Comment: occasionally    Allergies:  Allergies  Allergen Reactions  . Shellfish Allergy Hives    Prescriptions prior to admission  Medication Sig Dispense Refill Last Dose  . EPINEPHrine (EPIPEN) 0.3 mg/0.3 mL SOAJ injection Inject 0.3 mLs (0.3 mg total) into the muscle as needed. 2 Device 0 Emergency  . ibuprofen (ADVIL,MOTRIN) 800 MG tablet Take 800 mg  by mouth daily as needed for headache.   Past Week at Unknown time    Review of Systems  Constitutional: Negative for fever and chills.  Gastrointestinal: Positive for abdominal pain. Negative for nausea and vomiting.  Genitourinary: Positive for dysuria.   Physical Exam   Blood pressure 151/92, pulse 92, temperature 98.2 F (36.8 C), temperature source Oral, resp. rate 20, height 5\' 4"  (1.626 m), weight 342 lb (155.13 kg).  Physical Exam  Constitutional: She is oriented to person, place, and time. She appears well-developed and well-nourished. No distress.  HENT:  Head: Normocephalic.  Cardiovascular: Normal rate.   Respiratory: Effort normal.  GI: Soft. She exhibits no distension. There is tenderness. There is no rebound and no guarding.  Genitourinary:  Cervix closed Uterus small and nontender Mod to small blood with clot   Musculoskeletal: Normal range of motion.  Neurological: She is alert and oriented to person, place, and time.  Skin: Skin is warm and dry.  Psychiatric: She has a normal mood and affect.    MAU Course  Procedures  MDM Results for orders placed or performed during the hospital encounter of 04/22/14 (from the past 24 hour(s))  Urinalysis,  Routine w reflex microscopic     Status: Abnormal   Collection Time: 04/22/14 11:45 AM  Result Value Ref Range   Color, Urine RED (A) YELLOW   APPearance CLOUDY (A) CLEAR   Specific Gravity, Urine 1.020 1.005 - 1.030   pH 6.5 5.0 - 8.0   Glucose, UA 100 (A) NEGATIVE mg/dL   Hgb urine dipstick LARGE (A) NEGATIVE   Bilirubin Urine NEGATIVE NEGATIVE   Ketones, ur 15 (A) NEGATIVE mg/dL   Protein, ur >119>300 (A) NEGATIVE mg/dL   Urobilinogen, UA 2.0 (H) 0.0 - 1.0 mg/dL   Nitrite POSITIVE (A) NEGATIVE   Leukocytes, UA MODERATE (A) NEGATIVE  Urine microscopic-add on     Status: Abnormal   Collection Time: 04/22/14 11:45 AM  Result Value Ref Range   Squamous Epithelial / LPF FEW (A) RARE   WBC, UA 11-20 <3 WBC/hpf    RBC / HPF TOO NUMEROUS TO COUNT <3 RBC/hpf   Urine-Other MICROSCOPIC EXAM PERFORMED ON UNCONCENTRATED URINE   Pregnancy, urine POC     Status: Abnormal   Collection Time: 04/22/14 11:53 AM  Result Value Ref Range   Preg Test, Ur POSITIVE (A) NEGATIVE  CBC     Status: Abnormal   Collection Time: 04/22/14 12:20 PM  Result Value Ref Range   WBC 9.2 4.0 - 10.5 K/uL   RBC 4.38 3.87 - 5.11 MIL/uL   Hemoglobin 10.4 (L) 12.0 - 15.0 g/dL   HCT 14.732.8 (L) 82.936.0 - 56.246.0 %   MCV 74.9 (L) 78.0 - 100.0 fL   MCH 23.7 (L) 26.0 - 34.0 pg   MCHC 31.7 30.0 - 36.0 g/dL   RDW 13.016.4 (H) 86.511.5 - 78.415.5 %   Platelets 248 150 - 400 K/uL  hCG, quantitative, pregnancy     Status: Abnormal   Collection Time: 04/22/14 12:20 PM  Result Value Ref Range   hCG, Beta Chain, Quant, S 269 (H) <5 mIU/mL  Wet prep, genital     Status: Abnormal   Collection Time: 04/22/14  1:05 PM  Result Value Ref Range   Yeast Wet Prep HPF POC NONE SEEN NONE SEEN   Trich, Wet Prep NONE SEEN NONE SEEN   Clue Cells Wet Prep HPF POC FEW (A) NONE SEEN   WBC, Wet Prep HPF POC FEW (A) NONE SEEN     Assessment and Plan  A:  Pregnancy at early gestation      Threatened abortion      Probable UTI  P:  Discharge home       Urine to culture       Return to MAU in 2 days for repeat quant hcg  [facility] HospitalWILLIAMS,Trace Wirick 04/22/2014, 12:54 PM

## 2014-04-23 LAB — GC/CHLAMYDIA PROBE AMP
CT Probe RNA: NEGATIVE
GC Probe RNA: NEGATIVE

## 2014-04-24 ENCOUNTER — Encounter (HOSPITAL_COMMUNITY): Payer: Self-pay

## 2014-04-24 ENCOUNTER — Inpatient Hospital Stay (HOSPITAL_COMMUNITY)
Admission: AD | Admit: 2014-04-24 | Discharge: 2014-04-24 | Disposition: A | Discharge status: Home / Self Care | Payer: Self-pay | Source: Ambulatory Visit | Attending: Family Medicine | Admitting: Family Medicine

## 2014-04-24 DIAGNOSIS — Z3A00 Weeks of gestation of pregnancy not specified: Secondary | ICD-10-CM | POA: Insufficient documentation

## 2014-04-24 DIAGNOSIS — O035 Genital tract and pelvic infection following complete or unspecified spontaneous abortion: Secondary | ICD-10-CM | POA: Insufficient documentation

## 2014-04-24 DIAGNOSIS — O039 Complete or unspecified spontaneous abortion without complication: Secondary | ICD-10-CM

## 2014-04-24 LAB — URINALYSIS, ROUTINE W REFLEX MICROSCOPIC
Bilirubin Urine: NEGATIVE
Glucose, UA: NEGATIVE mg/dL
KETONES UR: NEGATIVE mg/dL
Nitrite: NEGATIVE
PROTEIN: NEGATIVE mg/dL
Specific Gravity, Urine: 1.015 (ref 1.005–1.030)
UROBILINOGEN UA: 0.2 mg/dL (ref 0.0–1.0)
pH: 6 (ref 5.0–8.0)

## 2014-04-24 LAB — URINE MICROSCOPIC-ADD ON

## 2014-04-24 LAB — HCG, QUANTITATIVE, PREGNANCY: hCG, Beta Chain, Quant, S: 106 m[IU]/mL — ABNORMAL HIGH (ref <5)

## 2014-04-24 NOTE — MAU Note (Signed)
Pt states here for repeat labs. Still having bleeding however has lightened up. Also still having pain, however not as severe as when here last.

## 2014-04-24 NOTE — MAU Provider Note (Signed)
Rose Manning is a 23 y.o. G3P1011 at Unknown who presents to MAU today for follow-up labs. Patient was seen in MAU on 04/22/14 for vaginal bleeding and abdominal pain. US on 04/22/14 showed no IUGS, YS or FP. Patient states continued vaginal bleeding and lower abdominal pain, although both improved from previous visit. Patient states that she is changing a pad q 2 hours since yesterday. Abdominal cramping rated at 2/10 now. Ibuprofen taken yesterday, none today.   Temp(Src) 98.3 F (36.8 C) (Oral)  Resp 16  Ht 5\' 4"  (1.626 m)  LMP  (LMP Unknown)  Breastfeeding? No GENERAL: Well-developed, well-nourished female in no acute distress.  HEENT: Normocephalic, atraumatic.   LUNGS: Effort normal HEART: Regular rate  SKIN: Warm, dry and without erythema PSYCH: Normal mood and affect  Results for orders placed or performed during the hospital encounter of 04/24/14 (from the past 24 hour(s))  Urinalysis, Routine w reflex microscopic     Status: Abnormal   Collection Time: 04/24/14  9:20 AM  Result Value Ref Range   Color, Urine YELLOW YELLOW   APPearance HAZY (A) CLEAR   Specific Gravity, Urine 1.015 1.005 - 1.030   pH 6.0 5.0 - 8.0   Glucose, UA NEGATIVE NEGATIVE mg/dL   Hgb urine dipstick LARGE (A) NEGATIVE   Bilirubin Urine NEGATIVE NEGATIVE   Ketones, ur NEGATIVE NEGATIVE mg/dL   Protein, ur NEGATIVE NEGATIVE mg/dL   Urobilinogen, UA 0.2 0.0 - 1.0 mg/dL   Nitrite NEGATIVE NEGATIVE   Leukocytes, UA SMALL (A) NEGATIVE  Urine microscopic-add on     Status: Abnormal   Collection Time: 04/24/14  9:20 AM  Result Value Ref Range   Squamous Epithelial / LPF FEW (A) RARE   WBC, UA 21-50 <3 WBC/hpf   RBC / HPF 21-50 <3 RBC/hpf   Bacteria, UA MANY (A) RARE  hCG, quantitative, pregnancy     Status: Abnormal   Collection Time: 04/24/14  9:50 AM  Result Value Ref Range   hCG, Beta Chain, Quant, S 106 (H) <5 mIU/mL   Results for Donell SievertSTEPHENS, Lane (MRN [id number]) as of 04/24/2014  10:31  Ref. Range 04/22/2014 12:20 04/22/2014 13:05 04/22/2014 13:09 04/22/2014 14:39 04/24/2014 09:20 04/24/2014 09:50  hCG, Beta Chain, Quant, S Latest Range: <5 mIU/mL 269 (H)     106 (H)    MDM UTI noted at last visit. Urine culture was not sent. Urine collected today and Urine culture ordered. Patient is currently taking Keflex.  Discussed results with patient. Offered pain medication and anti-emetics. Patient declines at this time.   A: SAB UTI  P: Discharge home Bleeding precautions discussed [facility] Advised patient of safe sex practices prior to follow-up Patient may return to MAU as needed or if her condition were to change or worsen   Marny LowensteinJulie N Wenzel, PA-C 04/24/2014 10:34 AM

## 2014-04-24 NOTE — Discharge Instructions (Signed)
Incomplete Miscarriage A miscarriage is the sudden loss of an unborn baby (fetus) before the 20th week of pregnancy. In an incomplete miscarriage, parts of the fetus or placenta (afterbirth) remain in the body.  Having a miscarriage can be an emotional experience. Talk with your health care provider about any questions you may have about miscarrying, the grieving process, and your future pregnancy plans. CAUSES   Problems with the fetal chromosomes that make it impossible for the baby to develop normally. Problems with the baby's genes or chromosomes are most often the result of errors that occur by chance as the embryo divides and grows. The problems are not inherited from the parents.  Infection of the cervix or uterus.  Hormone problems.  Problems with the cervix, such as having an incompetent cervix. This is when the tissue in the cervix is not strong enough to hold the pregnancy.  Problems with the uterus, such as an abnormally shaped uterus, uterine fibroids, or congenital abnormalities.  Certain medical conditions.  Smoking, drinking alcohol, or taking illegal drugs.  Trauma. SYMPTOMS   Vaginal bleeding or spotting, with or without cramps or pain.  Pain or cramping in the abdomen or lower back.  Passing fluid, tissue, or blood clots from the vagina. DIAGNOSIS  Your health care provider will perform a physical exam. You may also have an ultrasound to confirm the miscarriage. Blood or urine tests may also be ordered. TREATMENT   Usually, a dilation and curettage (D&C) procedure is performed. During a D&C procedure, the cervix is widened (dilated) and any remaining fetal or placental tissue is gently removed from the uterus.  Antibiotic medicines are prescribed if there is an infection. Other medicines may be given to reduce the size of the uterus (contract) if there is a lot of bleeding.  If you have Rh negative blood and your baby was Rh positive, you will need a Rho (D)  immune globulin shot. This shot will protect any future baby from having Rh blood problems in future pregnancies.  You may be confined to bed rest. This means you should stay in bed and only get up to use the bathroom. HOME CARE INSTRUCTIONS   Rest as directed by your health care provider.  Restrict activity as directed by your health care provider. You may be allowed to continue light activity if curettage was not done but you require further treatment.  Keep track of the number of pads you use each day. Keep track of how soaked (saturated) they are. Record this information.  Do not  use tampons.  Do not douche or have sexual intercourse until approved by your health care provider.  Keep all follow-up appointments for reevaluation and continuing management.  Only take over-the-counter or prescription medicines for pain, fever, or discomfort as directed by your health care provider.  Take antibiotic medicine as directed by your health care provider. Make sure you finish it even if you start to feel better. SEEK IMMEDIATE MEDICAL CARE IF:   You experience severe cramps in your stomach, back, or abdomen.  You have an unexplained temperature (make sure to record these temperatures).  You pass large clots or tissue (save these for your health care provider to inspect).  Your bleeding increases.  You become light-headed, weak, or have fainting episodes. MAKE SURE YOU:   Understand these instructions.  Will watch your condition.  Will get help right away if you are not doing well or get worse. Document Released: 05/07/2005 Document Revised: 09/21/2013 Document Reviewed:   12/04/2012 ExitCare Patient Information 2015 Exit[employer]. This information is not intended to replace advice given to you by your health care provider. Make sure you discuss any questions you have with your health care provider.  

## 2014-04-25 LAB — CULTURE, OB URINE: Colony Count: 75000

## 2014-12-14 ENCOUNTER — Encounter (HOSPITAL_BASED_OUTPATIENT_CLINIC_OR_DEPARTMENT_OTHER): Payer: Self-pay

## 2014-12-14 ENCOUNTER — Emergency Department (HOSPITAL_BASED_OUTPATIENT_CLINIC_OR_DEPARTMENT_OTHER): Payer: Commercial Managed Care - PPO

## 2014-12-14 ENCOUNTER — Emergency Department (HOSPITAL_BASED_OUTPATIENT_CLINIC_OR_DEPARTMENT_OTHER)
Admission: EM | Admit: 2014-12-14 | Discharge: 2014-12-14 | Disposition: A | Discharge status: Home / Self Care | Payer: Commercial Managed Care - PPO | Attending: Emergency Medicine | Admitting: Emergency Medicine

## 2014-12-14 DIAGNOSIS — R1011 Right upper quadrant pain: Secondary | ICD-10-CM | POA: Insufficient documentation

## 2014-12-14 DIAGNOSIS — Z9049 Acquired absence of other specified parts of digestive tract: Secondary | ICD-10-CM | POA: Insufficient documentation

## 2014-12-14 DIAGNOSIS — Z792 Long term (current) use of antibiotics: Secondary | ICD-10-CM | POA: Insufficient documentation

## 2014-12-14 DIAGNOSIS — R630 Anorexia: Secondary | ICD-10-CM | POA: Diagnosis not present

## 2014-12-14 DIAGNOSIS — R112 Nausea with vomiting, unspecified: Secondary | ICD-10-CM | POA: Insufficient documentation

## 2014-12-14 DIAGNOSIS — Z8679 Personal history of other diseases of the circulatory system: Secondary | ICD-10-CM | POA: Insufficient documentation

## 2014-12-14 DIAGNOSIS — R1031 Right lower quadrant pain: Secondary | ICD-10-CM | POA: Diagnosis not present

## 2014-12-14 DIAGNOSIS — Z8619 Personal history of other infectious and parasitic diseases: Secondary | ICD-10-CM | POA: Diagnosis not present

## 2014-12-14 DIAGNOSIS — R109 Unspecified abdominal pain: Secondary | ICD-10-CM

## 2014-12-14 DIAGNOSIS — R197 Diarrhea, unspecified: Secondary | ICD-10-CM | POA: Insufficient documentation

## 2014-12-14 DIAGNOSIS — E669 Obesity, unspecified: Secondary | ICD-10-CM | POA: Insufficient documentation

## 2014-12-14 LAB — COMPREHENSIVE METABOLIC PANEL
ALBUMIN: 3.3 g/dL — AB (ref 3.5–5.0)
ALK PHOS: 94 U/L (ref 38–126)
ALT: 14 U/L (ref 14–54)
AST: 17 U/L (ref 15–41)
Anion gap: 6 (ref 5–15)
BUN: 10 mg/dL (ref 6–20)
CALCIUM: 8.5 mg/dL — AB (ref 8.9–10.3)
CHLORIDE: 105 mmol/L (ref 101–111)
CO2: 25 mmol/L (ref 22–32)
Creatinine, Ser: 0.62 mg/dL (ref 0.44–1.00)
Glucose, Bld: 89 mg/dL (ref 65–99)
POTASSIUM: 3.7 mmol/L (ref 3.5–5.1)
SODIUM: 136 mmol/L (ref 135–145)
Total Bilirubin: 0.8 mg/dL (ref 0.3–1.2)
Total Protein: 7.3 g/dL (ref 6.5–8.1)

## 2014-12-14 LAB — LIPASE, BLOOD: Lipase: 16 U/L — ABNORMAL LOW (ref 22–51)

## 2014-12-14 MED ORDER — SODIUM CHLORIDE 0.9 % IV BOLUS (SEPSIS)
1000.0000 mL | Freq: Once | INTRAVENOUS | Status: AC
  Administered 2014-12-14: 1000 mL via INTRAVENOUS

## 2014-12-14 MED ORDER — IOHEXOL 300 MG/ML  SOLN
100.0000 mL | Freq: Once | INTRAMUSCULAR | Status: AC | PRN
  Administered 2014-12-14: 100 mL via INTRAVENOUS

## 2014-12-14 MED ORDER — ONDANSETRON HCL 4 MG/2ML IJ SOLN
4.0000 mg | Freq: Once | INTRAMUSCULAR | Status: AC
  Administered 2014-12-14: 4 mg via INTRAVENOUS
  Filled 2014-12-14: qty 2

## 2014-12-14 MED ORDER — PROMETHAZINE HCL 25 MG PO TABS: 25.0000 mg | ORAL_TABLET | Freq: Four times a day (QID) | ORAL | Status: DC | PRN

## 2014-12-14 MED ORDER — IOHEXOL 300 MG/ML  SOLN
25.0000 mL | Freq: Once | INTRAMUSCULAR | Status: AC | PRN
  Administered 2014-12-14: 25 mL via ORAL

## 2014-12-14 MED ORDER — HYDROMORPHONE HCL 1 MG/ML IJ SOLN
1.0000 mg | Freq: Once | INTRAMUSCULAR | Status: AC
  Administered 2014-12-14: 1 mg via INTRAVENOUS
  Filled 2014-12-14: qty 1

## 2014-12-14 MED ORDER — PROMETHAZINE HCL 25 MG/ML IJ SOLN
12.5000 mg | Freq: Once | INTRAMUSCULAR | Status: AC
  Administered 2014-12-14: 12.5 mg via INTRAVENOUS
  Filled 2014-12-14: qty 1

## 2014-12-14 MED ORDER — HYDROCODONE-ACETAMINOPHEN 5-325 MG PO TABS: ORAL_TABLET | ORAL | Status: DC

## 2014-12-14 NOTE — ED Notes (Signed)
Pt reports right side pain x 2 weeks but worse this AM at 0200. Pt sts moving would make the pain better. Reports 5 episodes of emesis today. Seen at [facility] and sent here for eval.

## 2014-12-14 NOTE — ED Notes (Signed)
MD at bedside. 

## 2014-12-14 NOTE — ED Notes (Addendum)
MD at bedside discussing test results and dispo plan of care. 

## 2014-12-14 NOTE — ED Provider Notes (Signed)
PROGRESS NOTE                                                                                                                 This is a sign-out from Dr. Anitra Lauth at shift change: Rose Manning is a 24 y.o. female presenting with Right Flank. Plan is to f/u her oral challenge and make sure she is not nauseous.  Please refer to previous note for full HPI, ROS, PMH and PE.   Tolerating by mouth as per RN and reports significant improvement in nausea.   Filed Vitals:   12/14/14 1238 12/14/14 1503 12/14/14 1634  BP: 137/88 127/95 123/69  Pulse: 102 89 91  Temp: 99.3 F (37.4 C)  99.5 F (37.5 C)  TempSrc: Oral  Oral  Resp: Height:  (1.676 m)    Weight: 341 lb (154.677 kg)    SpO2: 100% 99% 97%    Medications  HYDROmorphone (DILAUDID) injection 1 mg (1 mg Intravenous Given 12/14/14 1302)  ondansetron (ZOFRAN) injection 4 mg (4 mg Intravenous Given 12/14/14 1302)  sodium chloride 0.9 % bolus 1,000 mL (0 mLs Intravenous Stopped 12/14/14 1515)  iohexol (OMNIPAQUE) 300 MG/ML solution 25 mL (25 mLs Oral Contrast Given 12/14/14 1438)  iohexol (OMNIPAQUE) 300 MG/ML solution 100 mL (100 mLs Intravenous Contrast Given 12/14/14 1438)  promethazine (PHENERGAN) injection 12.5 mg (12.5 mg Intravenous Given 12/14/14 1526)    Evaluation does not show pathology that would require ongoing emergent intervention or inpatient treatment. Pt is hemodynamically stable and mentating appropriately. Discussed findings and plan with patient/guardian, who agrees with care plan. All questions answered. Return precautions discussed and outpatient follow up given.   Discharge Medication List as of 12/14/2014  3:28 PM    START taking these medications   Details  HYDROcodone-acetaminophen (NORCO/VICODIN) 5-325 MG per tablet Take 1-2 tablets by mouth every 6 hours as needed for pain and/or cough., Print    promethazine (PHENERGAN) 25 MG tablet Take 1 tablet (25 mg total) by mouth every 6 (six) hours as  needed for nausea or vomiting., Starting 12/14/2014, Until Discontinued, [employer], PA-C 12/14/14 1814  Gwyneth Sprout, MD 12/15/14 [phone]

## 2014-12-14 NOTE — Discharge Instructions (Signed)
Take vicodin for breakthrough pain, do not drink alcohol, drive, care for children or do other critical tasks while taking vicodin.  Push fluids: take small frequent sips of water or Gatorade, do not drink any soda, juice or caffeinated beverages.    Slowly resume solid diet as desired. Avoid food that are spicy, contain dairy and/or have high fat content.  Aviod NSAIDs (aspirin, motrin, ibuprofen, naproxen, Aleve et Karie Soda) for pain control because they will irritate your stomach.  Return to the emergency room for severely worsening abdominal pain, abdominal pain that localizes to a particular area (especially the right lower part of the belly), pain that persists past 8-10 hours, blood in stool or vomit, severe weakness, fainting, or fever.    Abdominal Pain, Women Abdominal (stomach, pelvic, or belly) pain can be caused by many things. It is important to tell your doctor:  The location of the pain.  Does it come and go or is it present all the time?  Are there things that start the pain (eating certain foods, exercise)?  Are there other symptoms associated with the pain (fever, nausea, vomiting, diarrhea)? All of this is helpful to know when trying to find the cause of the pain. CAUSES   Stomach: virus or bacteria infection, or ulcer.  Intestine: appendicitis (inflamed appendix), regional ileitis (Crohn's disease), ulcerative colitis (inflamed colon), irritable bowel syndrome, diverticulitis (inflamed diverticulum of the colon), or cancer of the stomach or intestine.  Gallbladder disease or stones in the gallbladder.  Kidney disease, kidney stones, or infection.  Pancreas infection or cancer.  Fibromyalgia (pain disorder).  Diseases of the female organs:  Uterus: fibroid (non-cancerous) tumors or infection.  Fallopian tubes: infection or tubal pregnancy.  Ovary: cysts or tumors.  Pelvic adhesions (scar tissue).  Endometriosis (uterus lining tissue growing in the  pelvis and on the pelvic organs).  Pelvic congestion syndrome (female organs filling up with blood just before the menstrual period).  Pain with the menstrual period.  Pain with ovulation (producing an egg).  Pain with an IUD (intrauterine device, birth control) in the uterus.  Cancer of the female organs.  Functional pain (pain not caused by a disease, may improve without treatment).  Psychological pain.  Depression. DIAGNOSIS  Your doctor will decide the seriousness of your pain by doing an examination.  Blood tests.  X-rays.  Ultrasound.  CT scan (computed tomography, special type of X-ray).  MRI (magnetic resonance imaging).  Cultures, for infection.  Barium enema (dye inserted in the large intestine, to better view it with X-rays).  Colonoscopy (looking in intestine with a lighted tube).  Laparoscopy (minor surgery, looking in abdomen with a lighted tube).  Major abdominal exploratory surgery (looking in abdomen with a large incision). TREATMENT  The treatment will depend on the cause of the pain.   Many cases can be observed and treated at home.  Over-the-counter medicines recommended by your caregiver.  Prescription medicine.  Antibiotics, for infection.  Birth control pills, for painful periods or for ovulation pain.  Hormone treatment, for endometriosis.  Nerve blocking injections.  Physical therapy.  Antidepressants.  Counseling with a psychologist or psychiatrist.  Minor or major surgery. HOME CARE INSTRUCTIONS   Do not take laxatives, unless directed by your caregiver.  Take over-the-counter pain medicine only if ordered by your caregiver. Do not take aspirin because it can cause an upset stomach or bleeding.  Try a clear liquid diet (broth or water) as ordered by your caregiver. Slowly move to a bland diet,  as tolerated, if the pain is related to the stomach or intestine.  Have a thermometer and take your temperature several times a  day, and record it.  Bed rest and sleep, if it helps the pain.  Avoid sexual intercourse, if it causes pain.  Avoid stressful situations.  Keep your follow-up appointments and tests, as your caregiver orders.  If the pain does not go away with medicine or surgery, you may try:  Acupuncture.  Relaxation exercises (yoga, meditation).  Group therapy.  Counseling. SEEK MEDICAL CARE IF:   You notice certain foods cause stomach pain.  Your home care treatment is not helping your pain.  You need stronger pain medicine.  You want your IUD removed.  You feel faint or lightheaded.  You develop nausea and vomiting.  You develop a rash.  You are having side effects or an allergy to your medicine. SEEK IMMEDIATE MEDICAL CARE IF:   Your pain does not go away or gets worse.  You have a fever.  Your pain is felt only in portions of the abdomen. The right side could possibly be appendicitis. The left lower portion of the abdomen could be colitis or diverticulitis.  You are passing blood in your stools (bright red or black tarry stools, with or without vomiting).  You have blood in your urine.  You develop chills, with or without a fever.  You pass out. MAKE SURE YOU:   Understand these instructions.  Will watch your condition.  Will get help right away if you are not doing well or get worse. Document Released: 03/04/2007 Document Revised: 09/21/2013 Document Reviewed: 03/24/2009 [facility] Patient Information 2015 [place], [place]. This information is not intended to replace advice given to you by your health care provider. Make sure you discuss any questions you have with your health care provider.

## 2014-12-14 NOTE — ED Notes (Signed)
Patient transported to radiology

## 2014-12-14 NOTE — ED Provider Notes (Addendum)
CSN: [id number]     Arrival date & time 12/14/14  1226 History   First MD Initiated Contact with Patient 12/14/14 1230     Chief Complaint  Patient presents with  . Abdominal Pain     (Consider location/radiation/quality/duration/timing/severity/associated sxs/prior Treatment) Patient is a 24 y.o. female presenting with abdominal pain. The history is provided by the patient.  Abdominal Pain Pain location:  RUQ and RLQ Pain quality: aching, gnawing and sharp   Pain radiates to:  Does not radiate Pain severity:  Severe Onset quality:  Gradual Duration: Intermittent pain for the last 2 weeks but much worse this morning waking her at about 2 AM. Timing:  Constant Progression:  Worsening Chronicity:  New Context: previous surgery   Context: not diet changes and not recent illness   Context comment:  Had noticed possibly worse after lunch in the last 2 weeks but woke her from sleep this morning Relieved by:  Nothing Worsened by:  Eating Associated symptoms: anorexia, diarrhea, nausea and vomiting   Associated symptoms: no chest pain, no cough, no dysuria, no fever, no hematemesis, no shortness of breath, no vaginal bleeding and no vaginal discharge   Risk factors comment:  Patient seen in urgent care and sent here for further evaluation. Her urine pregnancy test was negative, UA was consistent with trace leukocytes but no other findings and CBC was normal. Patient denies any prior history of kidney stones but has had pyelone   Past Medical History  Diagnosis Date  . No pertinent past medical history   . Migraine   . Obesity   . Hx of varicella   . Pregnancy induced hypertension    Past Surgical History  Procedure Laterality Date  . Wisdom tooth extraction    . Cholecystectomy  01/2009   Family History  Problem Relation Age of Onset  . Anesthesia problems Neg Hx   . Hypertension Mother   . Urolithiasis Mother   . Migraines Mother   . Hypertension Father   . Hypertension  Maternal Grandmother   . Hypertension Maternal Grandfather   . Alzheimer's disease Maternal Grandfather   . Hypertension Paternal Grandmother   . Hypertension Paternal Grandfather   . Heart disease Paternal Grandfather   . Lupus Maternal Aunt   . Rheum arthritis Maternal Aunt   . Diabetes Cousin    History  Substance Use Topics  . Smoking status: Never Smoker   . Smokeless tobacco: Never Used  . Alcohol Use: No     Comment: occasionally   OB History    Gravida Para Term Preterm AB TAB SAB Ectopic Multiple Living   3 1 1  1  1   1      Review of Systems  Constitutional: Negative for fever.  Respiratory: Negative for cough and shortness of breath.   Cardiovascular: Negative for chest pain.  Gastrointestinal: Positive for nausea, vomiting, abdominal pain, diarrhea and anorexia. Negative for hematemesis.  Genitourinary: Negative for dysuria, vaginal bleeding and vaginal discharge.  All other systems reviewed and are negative.     Allergies  Shellfish allergy  Home Medications   Prior to Admission medications   Medication Sig Start Date End Date Taking? Authorizing Provider  cephALEXin (KEFLEX) 500 MG capsule Take 1 capsule (500 mg total) by mouth 4 (four) times daily. 04/22/14   Aviva Signs, CNM  EPINEPHrine (EPIPEN) 0.3 mg/0.3 mL SOAJ injection Inject 0.3 mLs (0.3 mg total) into the muscle as needed. 05/25/13   Devoria Albe, MD  BP 137/88 mmHg  Pulse 102  Temp(Src) 99.3 F (37.4 C) (Oral)  Resp 20  Ht 5\' 6"  (1.676 m)  Wt 341 lb (154.677 kg)  BMI 55.07 kg/m2  SpO2 100%  LMP 11/04/2014 Physical Exam  Constitutional: She is oriented to person, place, and time. She appears well-developed and well-nourished. No distress.  HENT:  Head: Normocephalic and atraumatic.  Mouth/Throat: Oropharynx is clear and moist. Mucous membranes are dry.  Eyes: Conjunctivae and EOM are normal. Pupils are equal, round, and reactive to light.  Neck: Normal range of motion. Neck supple.   Cardiovascular: Normal rate, regular rhythm and intact distal pulses.   No murmur heard. Pulmonary/Chest: Effort normal and breath sounds normal. No respiratory distress. She has no wheezes. She has no rales.  Abdominal: Soft. She exhibits no distension. There is tenderness in the right upper quadrant and right lower quadrant. There is guarding. There is no rebound and no CVA tenderness.  Musculoskeletal: Normal range of motion. She exhibits no edema or tenderness.  Neurological: She is alert and oriented to person, place, and time.  Skin: Skin is warm and dry. No rash noted. No erythema.  Psychiatric: She has a normal mood and affect. Her behavior is normal.  Nursing note and vitals reviewed.   ED Course  Procedures (including critical care time) Labs Review Labs Reviewed  COMPREHENSIVE METABOLIC PANEL - Abnormal; Notable for the following:    Calcium 8.5 (*)    Albumin 3.3 (*)    All other components within normal limits  LIPASE, BLOOD - Abnormal; Notable for the following:    Lipase 16 (*)    All other components within normal limits    Imaging Review Ct Abdomen Pelvis W Contrast  12/14/2014   CLINICAL DATA:  Right lower quadrant abdomen pain for several days.  EXAM: CT ABDOMEN AND PELVIS WITH CONTRAST  TECHNIQUE: Multidetector CT imaging of the abdomen and pelvis was performed using the standard protocol following bolus administration of intravenous contrast.  CONTRAST:  OMNIPAQUE IOHEXOL 300 MG/ML SOLN, 25mL OMNIPAQUE IOHEXOL 300 MG/ML SOLN  COMPARISON:  Sep 26, 2011  FINDINGS: The liver, spleen, pancreas, adrenal glands and kidneys are normal. The patient is status post prior cholecystectomy. The aorta is normal. There is no abdominal lymphadenopathy. There is no small bowel obstruction or diverticulitis. The appendix is normal.  Partial fluid-filled bladder is normal. The uterus is normal. There is no pelvic lymphadenopathy. There is 1.3 cm umbilical herniation of mesenteric  fat.  The visualized lung bases are clear. No acute abnormality is identified the visualized bones.  IMPRESSION: Normal appendix. There is no small bowel obstruction. No acute abnormality identified in the abdomen and pelvis.   Electronically Signed   By: Sherian Rein M.D.   On: 12/14/2014 14:57     EKG Interpretation None      MDM   Final diagnoses:  None   patient is an obese 24 year old female status post cholecystectomy who presents today with severe right-sided pain and vomiting. Patient has had intermittent pain for the last 2 weeks sometimes after eating which has abruptly worsened this morning at approximately 2 AM with associated vomiting. She denies any urinary symptoms, no vaginal discharge. On exam patient has right upper quadrant, right lower quadrant and mild right CVA tenderness. She was seen in urgent care prior to this and had a normal CBC, negative UPT and urine with trace leukocytes but no blood.  CMP and lipase pending. Patient denies any excessive alcohol use  or recurrent NSAIDs. Low suspicion for gastritis at this time. Concern for possible appendicitis versus ovarian pathology.  Patient given IV fluids, pain and nausea control  2:08 PM CMP within normal limits. We'll do a CT to rule out ovarian pathology versus appendicitis.  3:08 PM CT neg for acute pathology.  After 1 dose of pain meds pt's pain is resolved.  Pt's nausea is still present.  Will give phenergan.   Gwyneth Sprout, MD 12/14/14 1610  Gwyneth Sprout, MD 12/14/14 [id number]

## 2016-06-18 ENCOUNTER — Emergency Department (HOSPITAL_BASED_OUTPATIENT_CLINIC_OR_DEPARTMENT_OTHER)
Admission: EM | Admit: 2016-06-18 | Discharge: 2016-06-18 | Disposition: A | Discharge status: Home / Self Care | Payer: Self-pay | Attending: Emergency Medicine | Admitting: Emergency Medicine

## 2016-06-18 ENCOUNTER — Emergency Department (HOSPITAL_BASED_OUTPATIENT_CLINIC_OR_DEPARTMENT_OTHER): Payer: Self-pay

## 2016-06-18 ENCOUNTER — Encounter (HOSPITAL_BASED_OUTPATIENT_CLINIC_OR_DEPARTMENT_OTHER): Payer: Self-pay | Admitting: Emergency Medicine

## 2016-06-18 DIAGNOSIS — S0990XA Unspecified injury of head, initial encounter: Secondary | ICD-10-CM

## 2016-06-18 DIAGNOSIS — Y939 Activity, unspecified: Secondary | ICD-10-CM | POA: Insufficient documentation

## 2016-06-18 DIAGNOSIS — Y929 Unspecified place or not applicable: Secondary | ICD-10-CM | POA: Insufficient documentation

## 2016-06-18 DIAGNOSIS — R55 Syncope and collapse: Secondary | ICD-10-CM | POA: Insufficient documentation

## 2016-06-18 DIAGNOSIS — Y999 Unspecified external cause status: Secondary | ICD-10-CM | POA: Insufficient documentation

## 2016-06-18 DIAGNOSIS — S01511A Laceration without foreign body of lip, initial encounter: Secondary | ICD-10-CM | POA: Insufficient documentation

## 2016-06-18 LAB — CBC WITH DIFFERENTIAL/PLATELET
Basophils Absolute: 0 10*3/uL (ref 0.0–0.1)
Basophils Relative: 0 %
Eosinophils Absolute: 0.1 10*3/uL (ref 0.0–0.7)
Eosinophils Relative: 1 %
HEMATOCRIT: 36.2 % (ref 36.0–46.0)
Hemoglobin: 11.5 g/dL — ABNORMAL LOW (ref 12.0–15.0)
Lymphocytes Relative: 15 %
Lymphs Abs: 1.9 10*3/uL (ref 0.7–4.0)
MCH: 23.9 pg — ABNORMAL LOW (ref 26.0–34.0)
MCHC: 31.8 g/dL (ref 30.0–36.0)
MCV: 75.1 fL — AB (ref 78.0–100.0)
MONO ABS: 0.6 10*3/uL (ref 0.1–1.0)
Monocytes Relative: 5 %
NEUTROS ABS: 10.6 10*3/uL — AB (ref 1.7–7.7)
Neutrophils Relative %: 79 %
Platelets: 264 10*3/uL (ref 150–400)
RBC: 4.82 MIL/uL (ref 3.87–5.11)
RDW: 16.6 % — AB (ref 11.5–15.5)
WBC: 13.2 10*3/uL — ABNORMAL HIGH (ref 4.0–10.5)

## 2016-06-18 LAB — BASIC METABOLIC PANEL
ANION GAP: 6 (ref 5–15)
BUN: 10 mg/dL (ref 6–20)
CO2: 27 mmol/L (ref 22–32)
Calcium: 8.8 mg/dL — ABNORMAL LOW (ref 8.9–10.3)
Chloride: 107 mmol/L (ref 101–111)
Creatinine, Ser: 0.74 mg/dL (ref 0.44–1.00)
GFR calc Af Amer: >60 mL/min (ref >60)
GFR calc non Af Amer: >60 mL/min (ref >60)
GLUCOSE: 115 mg/dL — AB (ref 65–99)
POTASSIUM: 3.7 mmol/L (ref 3.5–5.1)
Sodium: 140 mmol/L (ref 135–145)

## 2016-06-18 LAB — PREGNANCY, URINE: Preg Test, Ur: NEGATIVE

## 2016-06-18 MED ORDER — IOPAMIDOL (ISOVUE-300) INJECTION 61%
100.0000 mL | Freq: Once | INTRAVENOUS | Status: AC | PRN
  Administered 2016-06-18: 76 mL via INTRAVENOUS

## 2016-06-18 MED ORDER — IBUPROFEN 800 MG PO TABS: 800.0000 mg | ORAL_TABLET | Freq: Three times a day (TID) | ORAL | 0 refills | Status: DC | PRN

## 2016-06-18 MED ORDER — IBUPROFEN 100 MG/5ML PO SUSP: 600.0000 mg | Freq: Four times a day (QID) | ORAL | 1 refills | Status: DC | PRN

## 2016-06-18 MED ORDER — ACETAMINOPHEN 500 MG PO TABS
1000.0000 mg | ORAL_TABLET | Freq: Once | ORAL | Status: AC
  Administered 2016-06-18: 1000 mg via ORAL
  Filled 2016-06-18: qty 2

## 2016-06-18 MED ORDER — LIDOCAINE HCL (PF) 1 % IJ SOLN
5.0000 mL | Freq: Once | INTRAMUSCULAR | Status: AC
  Administered 2016-06-18: 5 mL
  Filled 2016-06-18: qty 5

## 2016-06-18 MED ORDER — IBUPROFEN 100 MG/5ML PO SUSP
600.0000 mg | Freq: Once | ORAL | Status: AC
  Administered 2016-06-18: 600 mg via ORAL
  Filled 2016-06-18: qty 30

## 2016-06-18 MED ORDER — HYDROCODONE-ACETAMINOPHEN 5-325 MG PO TABS
1.0000 | ORAL_TABLET | Freq: Four times a day (QID) | ORAL | 0 refills | Status: DC | PRN
Start: 2016-06-18 — End: 2016-06-18

## 2016-06-18 MED ORDER — TETANUS-DIPHTH-ACELL PERTUSSIS 5-2.5-18.5 LF-MCG/0.5 IM SUSP
0.5000 mL | Freq: Once | INTRAMUSCULAR | Status: AC
  Administered 2016-06-18: 0.5 mL via INTRAMUSCULAR
  Filled 2016-06-18: qty 0.5

## 2016-06-18 MED ORDER — HYDROCODONE-ACETAMINOPHEN 7.5-325 MG/15ML PO SOLN: 10.0000 mL | Freq: Once | ORAL | Status: DC

## 2016-06-18 MED ORDER — HYDROCODONE-ACETAMINOPHEN 7.5-325 MG/15ML PO SOLN: ORAL | 0 refills | Status: DC
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
## 2016-06-18 NOTE — ED Notes (Signed)
Pt tolerating water.  

## 2016-06-18 NOTE — ED Notes (Signed)
Pt alert, NAD, calm, interactive, resps e/u, no dyspnea noted, skin W&D, speaking in complete clear sentences, to CT via stretcher, Dr. Elesa MassedWard into suture (pt not in room).
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
## 2016-06-18 NOTE — ED Notes (Signed)
Pt given Rx x 2 for hycet and ibuprofen. Reviewed d/c instructions including s/s of wound infection. Instructed pt to return to ED for any difficulty swallowing liquids or other concerns with her throat. Work note given per Dr. Donnald GarrePfeiffer

## 2016-06-18 NOTE — Discharge Instructions (Addendum)
A nodule was identified in your thyroid gland on the CT scan. This was not the reason for your visit but it does need to be further evaluated. Follow-up with your family doctor for repeat examination and monitoring of this. This is important because sometimes small nodules in the thyroid gland can represent cancer and must be treated. Failure to follow-up on this could result in serious medical problems.  Please avoid alcohol for the next week.  Please rest and drink plenty of water.  We recommend that you avoid any activity that may lead to another head injury for at least 1 week or until your symptoms have completely resolved.  We also recommend "brain rest" - please avoid TV, cell phones, tablets, computers as much as possible for the next 48 hours.

## 2016-06-20 ENCOUNTER — Emergency Department (HOSPITAL_COMMUNITY)
Admission: EM | Admit: 2016-06-20 | Discharge: 2016-06-21 | Disposition: A | Discharge status: Home / Self Care | Payer: Self-pay | Attending: Emergency Medicine | Admitting: Emergency Medicine

## 2016-06-20 ENCOUNTER — Encounter (HOSPITAL_COMMUNITY): Payer: Self-pay

## 2016-06-20 DIAGNOSIS — Z79899 Other long term (current) drug therapy: Secondary | ICD-10-CM | POA: Insufficient documentation

## 2016-06-20 DIAGNOSIS — J029 Acute pharyngitis, unspecified: Secondary | ICD-10-CM

## 2016-06-20 DIAGNOSIS — J039 Acute tonsillitis, unspecified: Secondary | ICD-10-CM | POA: Insufficient documentation

## 2016-06-20 LAB — BASIC METABOLIC PANEL
Anion gap: 10 (ref 5–15)
BUN: 7 mg/dL (ref 6–20)
CHLORIDE: 101 mmol/L (ref 101–111)
CO2: 26 mmol/L (ref 22–32)
CREATININE: 0.96 mg/dL (ref 0.44–1.00)
Calcium: 9 mg/dL (ref 8.9–10.3)
GFR calc Af Amer: >60 mL/min (ref >60)
GFR calc non Af Amer: >60 mL/min (ref >60)
Glucose, Bld: 93 mg/dL (ref 65–99)
Potassium: 4 mmol/L (ref 3.5–5.1)
SODIUM: 137 mmol/L (ref 135–145)

## 2016-06-20 LAB — CBC WITH DIFFERENTIAL/PLATELET
Basophils Absolute: 0 10*3/uL (ref 0.0–0.1)
Basophils Relative: 0 %
EOS ABS: 0 10*3/uL (ref 0.0–0.7)
Eosinophils Relative: 0 %
HCT: 38.6 % (ref 36.0–46.0)
Hemoglobin: 12.5 g/dL (ref 12.0–15.0)
Lymphocytes Relative: 12 %
Lymphs Abs: 1.9 10*3/uL (ref 0.7–4.0)
MCH: 23.9 pg — AB (ref 26.0–34.0)
MCHC: 32.4 g/dL (ref 30.0–36.0)
MCV: 73.8 fL — ABNORMAL LOW (ref 78.0–100.0)
Monocytes Absolute: 0.8 10*3/uL (ref 0.1–1.0)
Monocytes Relative: 5 %
Neutro Abs: 12.8 10*3/uL — ABNORMAL HIGH (ref 1.7–7.7)
Neutrophils Relative %: 83 %
Platelets: 227 10*3/uL (ref 150–400)
RBC: 5.23 MIL/uL — ABNORMAL HIGH (ref 3.87–5.11)
RDW: 16.6 % — ABNORMAL HIGH (ref 11.5–15.5)
WBC: 15.6 10*3/uL — ABNORMAL HIGH (ref 4.0–10.5)

## 2016-06-20 MED ORDER — SODIUM CHLORIDE 0.9 % IV BOLUS (SEPSIS)
1000.0000 mL | Freq: Once | INTRAVENOUS | Status: AC
  Administered 2016-06-20: 1000 mL via INTRAVENOUS

## 2016-06-20 MED ORDER — DEXAMETHASONE SODIUM PHOSPHATE 10 MG/ML IJ SOLN
10.0000 mg | Freq: Once | INTRAMUSCULAR | Status: AC
Start: 2016-06-20 — End: 2016-06-20
  Administered 2016-06-20: 10 mg via INTRAVENOUS
  Filled 2016-06-20: qty 1

## 2016-06-20 MED ORDER — KETOROLAC TROMETHAMINE 15 MG/ML IJ SOLN
15.0000 mg | Freq: Once | INTRAMUSCULAR | Status: AC
  Administered 2016-06-20: 15 mg via INTRAVENOUS
  Filled 2016-06-20: qty 1

## 2016-06-20 MED ORDER — ACETAMINOPHEN 325 MG PO TABS
650.0000 mg | ORAL_TABLET | Freq: Once | ORAL | Status: AC | PRN
  Administered 2016-06-20: 325 mg via ORAL
  Filled 2016-06-20: qty 2

## 2016-06-20 MED ORDER — MORPHINE SULFATE (PF) 4 MG/ML IV SOLN
4.0000 mg | Freq: Once | INTRAVENOUS | Status: AC
  Administered 2016-06-20: 4 mg via INTRAVENOUS
  Filled 2016-06-20: qty 1

## 2016-06-20 MED ORDER — IOPAMIDOL (ISOVUE-300) INJECTION 61%
INTRAVENOUS | Status: AC
  Filled 2016-06-20: qty 75

## 2016-06-20 MED ORDER — ACETAMINOPHEN 160 MG/5ML PO SOLN
325.0000 mg | Freq: Once | ORAL | Status: AC
  Administered 2016-06-20: 325 mg via ORAL
  Filled 2016-06-20: qty 15

## 2016-06-20 MED ORDER — SODIUM CHLORIDE 0.9 % IV SOLN
3.0000 g | Freq: Once | INTRAVENOUS | Status: AC
  Administered 2016-06-20: 3 g via INTRAVENOUS
  Filled 2016-06-20: qty 3
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
## 2016-06-21 ENCOUNTER — Emergency Department (HOSPITAL_COMMUNITY): Payer: Self-pay

## 2016-06-21 LAB — I-STAT BETA HCG BLOOD, ED (MC, WL, AP ONLY)

## 2016-06-21 MED ORDER — AMOXICILLIN-POT CLAVULANATE 600-42.9 MG/5ML PO SUSR: 875.0000 mg | Freq: Two times a day (BID) | ORAL | 0 refills | Status: AC

## 2016-06-21 MED ORDER — MAGIC MOUTHWASH
15.0000 mL | Freq: Once | ORAL | Status: AC
  Administered 2016-06-21: 15 mL via ORAL
  Filled 2016-06-21: qty 15

## 2016-06-21 MED ORDER — MAGIC MOUTHWASH: 10.0000 mL | Freq: Three times a day (TID) | ORAL | 0 refills | Status: DC | PRN

## 2016-06-21 MED ORDER — AZITHROMYCIN 200 MG/5ML PO SUSR
1000.0000 mg | Freq: Once | ORAL | Status: AC
  Administered 2016-06-21: 1000 mg via ORAL
  Filled 2016-06-21: qty 25

## 2016-06-21 MED ORDER — CEFTRIAXONE SODIUM 250 MG IJ SOLR
250.0000 mg | Freq: Once | INTRAMUSCULAR | Status: AC
  Administered 2016-06-21: 250 mg via INTRAMUSCULAR
  Filled 2016-06-21: qty 250

## 2016-06-21 MED ORDER — STERILE WATER FOR INJECTION IJ SOLN
INTRAMUSCULAR | Status: AC
  Filled 2016-06-21: qty 20

## 2016-06-21 MED ORDER — HYDROCODONE-ACETAMINOPHEN 7.5-325 MG/15ML PO SOLN
10.0000 mL | Freq: Once | ORAL | Status: AC
  Administered 2016-06-21: 10 mL via ORAL
  Filled 2016-06-21: qty 15

## 2016-06-21 MED ORDER — HYDROCODONE-ACETAMINOPHEN 7.5-325 MG/15ML PO SOLN: 15.0000 mL | ORAL | 0 refills | Status: AC | PRN

## 2016-06-21 MED ORDER — SODIUM CHLORIDE 0.9 % IV BOLUS (SEPSIS)
1000.0000 mL | Freq: Once | INTRAVENOUS | Status: AC
  Administered 2016-06-21: 1000 mL via INTRAVENOUS

## 2016-06-21 MED ORDER — IOPAMIDOL (ISOVUE-300) INJECTION 61%
75.0000 mL | Freq: Once | INTRAVENOUS | Status: AC | PRN
  Administered 2016-06-21: 75 mL via INTRAVENOUS

## 2016-06-21 NOTE — ED Notes (Signed)
Per dr. Orpah [place] okay for d/c if hr is less than <100, pt does not need receive fluid prior to d/c
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
## 2016-11-19 DIAGNOSIS — E041 Nontoxic single thyroid nodule: Secondary | ICD-10-CM | POA: Insufficient documentation

## 2017-05-21 HISTORY — PX: DILATION AND CURETTAGE OF UTERUS: SHX78

## 2017-07-02 ENCOUNTER — Inpatient Hospital Stay (HOSPITAL_COMMUNITY)
Admission: AD | Admit: 2017-07-02 | Discharge: 2017-07-02 | Disposition: A | Discharge status: Home / Self Care | Payer: BLUE CROSS/BLUE SHIELD | Source: Ambulatory Visit | Attending: Family Medicine | Admitting: Family Medicine

## 2017-07-02 ENCOUNTER — Inpatient Hospital Stay (HOSPITAL_COMMUNITY): Payer: BLUE CROSS/BLUE SHIELD

## 2017-07-02 ENCOUNTER — Other Ambulatory Visit: Payer: Self-pay

## 2017-07-02 DIAGNOSIS — O26891 Other specified pregnancy related conditions, first trimester: Secondary | ICD-10-CM | POA: Diagnosis not present

## 2017-07-02 DIAGNOSIS — O26899 Other specified pregnancy related conditions, unspecified trimester: Secondary | ICD-10-CM

## 2017-07-02 DIAGNOSIS — R030 Elevated blood-pressure reading, without diagnosis of hypertension: Secondary | ICD-10-CM | POA: Insufficient documentation

## 2017-07-02 DIAGNOSIS — Z3A01 Less than 8 weeks gestation of pregnancy: Secondary | ICD-10-CM | POA: Insufficient documentation

## 2017-07-02 DIAGNOSIS — O3680X0 Pregnancy with inconclusive fetal viability, not applicable or unspecified: Secondary | ICD-10-CM

## 2017-07-02 DIAGNOSIS — O99211 Obesity complicating pregnancy, first trimester: Secondary | ICD-10-CM | POA: Insufficient documentation

## 2017-07-02 DIAGNOSIS — R109 Unspecified abdominal pain: Secondary | ICD-10-CM | POA: Diagnosis present

## 2017-07-02 DIAGNOSIS — G43009 Migraine without aura, not intractable, without status migrainosus: Secondary | ICD-10-CM | POA: Diagnosis not present

## 2017-07-02 DIAGNOSIS — E669 Obesity, unspecified: Secondary | ICD-10-CM | POA: Insufficient documentation

## 2017-07-02 LAB — URINALYSIS, ROUTINE W REFLEX MICROSCOPIC
Bilirubin Urine: NEGATIVE
Glucose, UA: NEGATIVE mg/dL
HGB URINE DIPSTICK: NEGATIVE
Ketones, ur: NEGATIVE mg/dL
NITRITE: NEGATIVE
PH: 5 (ref 5.0–8.0)
Protein, ur: NEGATIVE mg/dL
SPECIFIC GRAVITY, URINE: 1.029 (ref 1.005–1.030)

## 2017-07-02 LAB — CBC
HEMATOCRIT: 36.6 % (ref 36.0–46.0)
Hemoglobin: 11.8 g/dL — ABNORMAL LOW (ref 12.0–15.0)
MCH: 24.3 pg — AB (ref 26.0–34.0)
MCHC: 32.2 g/dL (ref 30.0–36.0)
MCV: 75.3 fL — AB (ref 78.0–100.0)
Platelets: 269 10*3/uL (ref 150–400)
RBC: 4.86 MIL/uL (ref 3.87–5.11)
RDW: 16.6 % — ABNORMAL HIGH (ref 11.5–15.5)
WBC: 10.6 10*3/uL — ABNORMAL HIGH (ref 4.0–10.5)

## 2017-07-02 LAB — WET PREP, GENITAL
SPERM: NONE SEEN
Trich, Wet Prep: NONE SEEN
Yeast Wet Prep HPF POC: NONE SEEN

## 2017-07-02 LAB — POCT PREGNANCY, URINE: Preg Test, Ur: POSITIVE — AB

## 2017-07-02 LAB — ABO/RH: ABO/RH(D): O POS

## 2017-07-02 LAB — HCG, QUANTITATIVE, PREGNANCY: HCG, BETA CHAIN, QUANT, S: 334 m[IU]/mL — AB (ref <5)

## 2017-07-02 MED ORDER — BUTALBITAL-APAP-CAFFEINE 50-325-40 MG PO TABS: 1.0000 | ORAL_TABLET | Freq: Four times a day (QID) | ORAL | 0 refills | Status: DC | PRN

## 2017-07-02 NOTE — MAU Provider Note (Signed)
History     CSN: [id number]  Arrival date and time: 07/02/17 1831  Chief Complaint  Patient presents with  . Headache  . Abdominal Pain  . Nausea   HPI Rose Manning is a 27 y.o. [id number] at [redacted]w[redacted]d who presents with lower abdominal pain and a headache. She states the headache has been ongoing for 2 weeks and is mostly on the left side of her head. She has tried imitrex with some relief but the headache always comes back. She has a history of headaches and is seeing a primary physician about these.   She also reports left lower abdominal pain for the last 2 days. She rates the pain a 5/10 and has not tried anything for the pain. She denies any vaginal bleeding or discharge. LMP 05/24/2017 and had a negative pregnancy test in the doctors office last week.   OB History    Gravida Para Term Preterm AB Living   6 1 1   4 1    SAB TAB Ectopic Multiple Live Births   3 1     1       Past Medical History:  Diagnosis Date  . Hx of varicella   . Migraine   . No pertinent past medical history   . Obesity   . Pregnancy induced hypertension     Past Surgical History:  Procedure Laterality Date  . CHOLECYSTECTOMY  01/2009  . WISDOM TOOTH EXTRACTION      Family History  Problem Relation Age of Onset  . Hypertension Mother   . Urolithiasis Mother   . Migraines Mother   . Hypertension Father   . Hypertension Maternal Grandmother   . Hypertension Maternal Grandfather   . Alzheimer's disease Maternal Grandfather   . Hypertension Paternal Grandmother   . Hypertension Paternal Grandfather   . Heart disease Paternal Grandfather   . Lupus Maternal Aunt   . Rheum arthritis Maternal Aunt   . Diabetes Cousin   . Anesthesia problems Neg Hx     Social History   Tobacco Use  . Smoking status: Never Smoker  . Smokeless tobacco: Never Used  Substance Use Topics  . Alcohol use: No    Comment: occasionally  . Drug use: No    Allergies:  Allergies  Allergen Reactions  . Shellfish  Allergy Hives    Medications Prior to Admission  Medication Sig Dispense Refill Last Dose  . cephALEXin (KEFLEX) 500 MG capsule Take 1 capsule (500 mg total) by mouth 4 (four) times daily. (Patient not taking: Reported on 06/20/2016) 28 capsule 0 Not Taking at Unknown time  . EPINEPHrine (EPIPEN) 0.3 mg/0.3 mL SOAJ injection Inject 0.3 mLs (0.3 mg total) into the muscle as needed. 2 Device 0 not needed  . ibuprofen (CHILD IBUPROFEN) 100 MG/5ML suspension Take 30 mLs (600 mg total) by mouth every 6 (six) hours as needed. (Patient taking differently: Take 600 mg by mouth every 6 (six) hours as needed for mild pain. ) 273 mL 1 06/20/2016 at Unknown time  . magic mouthwash SOLN Take 10 mLs by mouth 3 (three) times daily as needed for mouth pain. 150 mL 0     Review of Systems  Constitutional: Negative.  Negative for fatigue and fever.  HENT: Negative.   Respiratory: Negative.  Negative for shortness of breath.   Cardiovascular: Negative.  Negative for chest pain.  Gastrointestinal: Positive for abdominal pain. Negative for constipation, diarrhea, nausea and vomiting.  Genitourinary: Negative.  Negative for dysuria, vaginal bleeding and vaginal  discharge.  Neurological: Positive for headaches. Negative for dizziness.   Physical Exam   Blood pressure (!) 97/56, pulse 87, temperature 98.8 F (37.1 C), temperature source Oral, resp. rate 20, height 5\' 5"  (1.651 m), weight (!) 341 lb (154.7 kg), last menstrual period 05/24/2017, SpO2 100 %.  Physical Exam  Nursing note and vitals reviewed. Constitutional: She is oriented to person, place, and time. She appears well-developed and well-nourished. No distress.  HENT:  Head: Normocephalic.  Eyes: Pupils are equal, round, and reactive to light.  Cardiovascular: Normal rate, regular rhythm and normal heart sounds.  Respiratory: Effort normal and breath sounds normal. No respiratory distress.  GI: Soft. Bowel sounds are normal. She exhibits no  distension and no mass. There is no tenderness. There is no rebound and no guarding.  Neurological: She is alert and oriented to person, place, and time.  Skin: Skin is warm and dry.  Psychiatric: She has a normal mood and affect. Her behavior is normal. Judgment and thought content normal.    MAU Course  Procedures Results for orders placed or performed during the hospital encounter of 07/02/17 (from the past 24 hour(s))  Urinalysis, Routine w reflex microscopic     Status: Abnormal   Collection Time: 07/02/17  6:56 PM  Result Value Ref Range   Color, Urine YELLOW YELLOW   APPearance CLOUDY (A) CLEAR   Specific Gravity, Urine 1.029 1.005 - 1.030   pH 5.0 5.0 - 8.0   Glucose, UA NEGATIVE NEGATIVE mg/dL   Hgb urine dipstick NEGATIVE NEGATIVE   Bilirubin Urine NEGATIVE NEGATIVE   Ketones, ur NEGATIVE NEGATIVE mg/dL   Protein, ur NEGATIVE NEGATIVE mg/dL   Nitrite NEGATIVE NEGATIVE   Leukocytes, UA SMALL (A) NEGATIVE   RBC / HPF 0-5 0 - 5 RBC/hpf   WBC, UA 6-30 0 - 5 WBC/hpf   Bacteria, UA RARE (A) NONE SEEN   Squamous Epithelial / LPF TOO NUMEROUS TO COUNT (A) NONE SEEN   Mucus PRESENT   Pregnancy, urine POC     Status: Abnormal   Collection Time: 07/02/17  7:02 PM  Result Value Ref Range   Preg Test, Ur POSITIVE (A) NEGATIVE  CBC     Status: Abnormal   Collection Time: 07/02/17  7:15 PM  Result Value Ref Range   WBC 10.6 (H) 4.0 - 10.5 K/uL   RBC 4.86 3.87 - 5.11 MIL/uL   Hemoglobin 11.8 (L) 12.0 - 15.0 g/dL   HCT 16.1 09.6 - 04.5 %   MCV 75.3 (L) 78.0 - 100.0 fL   MCH 24.3 (L) 26.0 - 34.0 pg   MCHC 32.2 30.0 - 36.0 g/dL   RDW 40.9 (H) 81.1 - 91.4 %   Platelets 269 150 - 400 K/uL  hCG, quantitative, pregnancy     Status: Abnormal   Collection Time: 07/02/17  7:15 PM  Result Value Ref Range   hCG, Beta Chain, Quant, S 334 (H) <5 mIU/mL  ABO/Rh     Status: None   Collection Time: 07/02/17  7:15 PM  Result Value Ref Range   ABO/RH(D)      O POS Performed at [facility], [address], [place], [place] [address]   Wet prep, genital     Status: Abnormal   Collection Time: 07/02/17  8:40 PM  Result Value Ref Range   Yeast Wet Prep HPF POC NONE SEEN NONE SEEN   Trich, Wet Prep NONE SEEN NONE SEEN   Clue Cells Wet Prep HPF POC  PRESENT (A) NONE SEEN   WBC, Wet Prep HPF POC FEW (A) NONE SEEN   Sperm NONE SEEN    [place] Ob Less Than 14 Weeks With Ob Transvaginal  Result Date: 07/02/2017 CLINICAL DATA:  Pregnant patient in first-trimester pregnancy with left lower quadrant abdominal/pelvic pain. Beta HCG 334. EXAM: OBSTETRIC <14 WK US AND TRANSVAGINAL OB US TECHNIQUE: Both transabdominal and transvaginal ultrasound examinations were performed for complete evaluation of the gestation as well as the maternal uterus, adnexal regions, and pelvic cul-de-sac. Transvaginal technique was performed to assess early pregnancy. COMPARISON:  None. FINDINGS: Intrauterine gestational sac: None Yolk sac:  Not Visualized. Embryo:  Not Visualized. Cardiac Activity: Not applicable. Subchorionic hemorrhage:  Not applicable. Maternal uterus/adnexae: Endometrium measures approximately 14 mm. No fluid in the endometrial canal. The right ovary is visualized and is normal in size and appearance. The left ovary is not visualized. There is no adnexal mass. Trace free fluid in the pelvis. IMPRESSION: No intrauterine pregnancy or findings suspicious for ectopic pregnancy. Findings are consistent with pregnancy of unknown location and may reflect early intrauterine pregnancy not yet visualized sonographically, occult ectopic pregnancy, or failed pregnancy. Pregnancy is likely too early to be visualized sonographically given beta HCG 334. Recommend trending of beta HCG and sonographic follow-up as indicated. Electronically Signed   By: Rubye OaksMelanie  Ehinger M.D.   On: 07/02/2017 21:15    MDM UA, UPT CBC, HCG, ABO/Rh Wet prep and gc/chlamydia US OB Transvaginal US OB Comp Less 14 weeks  Care  turned over to E. Uri Turnbough NP at 2100. Rolm BookbinderCaroline M Neill, CNM 07/02/17  8:53 PM  Ultrasound shows no IUP or adnexal mass. HCG 334  This abdominal pain could represent a normal pregnancy, spontaneous abortion, or even an ectopic pregnancy which can be life-threatening. Cultures were obtained to rule out pelvic infection.   Assessment and Plan  A: 1. Pregnancy of unknown anatomic location   2. Abdominal pain affecting pregnancy   3. Migraine without aura and without status migrainosus, not intractable   4. Elevated BP without diagnosis of hypertension    P: Discharge home Rx fioricet F/u with PCP regarding migraines Go to [facility] Friday at 9 am for repeat hcg Discussed reasons to return to [facility] including s/s ectopic   Judeth HornLawrence, Zadrian Mccauley, NP

## 2017-07-02 NOTE — MAU Note (Signed)
End of shift report given to Amber, RN. 

## 2017-07-02 NOTE — MAU Note (Signed)
Pt reports severe pains in her lower left abd, also has been having really bad headaches for several weeks and has been evaluated for that

## 2017-07-02 NOTE — Discharge Instructions (Signed)
Return to care   If you have heavier bleeding that soaks through more that 2 pads per hour for an hour or more  If you bleed so much that you feel like you might pass out or you do pass out  If you have significant abdominal pain that is not improved with Tylenol   If you develop a fever > 100.5    Migraine Headache A migraine headache is an intense, throbbing pain on one side or both sides of the head. Migraines may also cause other symptoms, such as nausea, vomiting, and sensitivity to light and noise. What are the causes? Doing or taking certain things may also trigger migraines, such as:  Alcohol.  Smoking.  Medicines, such as: ? Medicine used to treat chest pain (nitroglycerine). ? Birth control pills. ? Estrogen pills. ? Certain blood pressure medicines.  Aged cheeses, chocolate, or caffeine.  Foods or drinks that contain nitrates, glutamate, aspartame, or tyramine.  Physical activity.  Other things that may trigger a migraine include:  Menstruation.  Pregnancy.  Hunger.  Stress, lack of sleep, too much sleep, or fatigue.  Weather changes.  What increases the risk? The following factors may make you more likely to experience migraine headaches:  Age. Risk increases with age.  Family history of migraine headaches.  Being Caucasian.  Depression and anxiety.  Obesity.  Being a woman.  Having a hole in the heart (patent foramen ovale) or other heart problems.  What are the signs or symptoms? The main symptom of this condition is pulsating or throbbing pain. Pain may:  Happen in any area of the head, such as on one side or both sides.  Interfere with daily activities.  Get worse with physical activity.  Get worse with exposure to bright lights or loud noises.  Other symptoms may include:  Nausea.  Vomiting.  Dizziness.  General sensitivity to bright lights, loud noises, or smells.  Before you get a migraine, you may get warning signs  that a migraine is developing (aura). An aura may include:  Seeing flashing lights or having blind spots.  Seeing bright spots, halos, or zigzag lines.  Having tunnel vision or blurred vision.  Having numbness or a tingling feeling.  Having trouble talking.  Having muscle weakness.  How is this diagnosed? A migraine headache can be diagnosed based on:  Your symptoms.  A physical exam.  Tests, such as CT scan or MRI of the head. These imaging tests can help rule out other causes of headaches.  Taking fluid from the spine (lumbar puncture) and analyzing it (cerebrospinal fluid analysis, or CSF analysis).  How is this treated? A migraine headache is usually treated with medicines that:  Relieve pain.  Relieve nausea.  Prevent migraines from coming back.  Treatment may also include:  Acupuncture.  Lifestyle changes like avoiding foods that trigger migraines.  Follow these instructions at home: Medicines  Take over-the-counter and prescription medicines only as told by your health care provider.  Do not drive or use heavy machinery while taking prescription pain medicine.  To prevent or treat constipation while you are taking prescription pain medicine, your health care provider may recommend that you: ? Drink enough fluid to keep your urine clear or pale yellow. ? Take over-the-counter or prescription medicines. ? Eat foods that are high in fiber, such as fresh fruits and vegetables, whole grains, and beans. ? Limit foods that are high in fat and processed sugars, such as fried and sweet foods. Lifestyle  Avoid alcohol use.  Do not use any products that contain nicotine or tobacco, such as cigarettes and e-cigarettes. If you need help quitting, ask your health care provider.  Get at least 8 hours of sleep every night.  Limit your stress. General instructions   Keep a journal to find out what may trigger your migraine headaches. For example, write  down: ? What you eat and drink. ? How much sleep you get. ? Any change to your diet or medicines.  If you have a migraine: ? Avoid things that make your symptoms worse, such as bright lights. ? It may help to lie down in a dark, quiet room. ? Do not drive or use heavy machinery. ? Ask your health care provider what activities are safe for you while you are experiencing symptoms.  Keep all follow-up visits as told by your health care provider. This is important. Contact a health care provider if:  You develop symptoms that are different or more severe than your usual migraine symptoms. Get help right away if:  Your migraine becomes severe.  You have a fever.  You have a stiff neck.  You have vision loss.  Your muscles feel weak or like you cannot control them.  You start to lose your balance often.  You develop trouble walking.  You faint. This information is not intended to replace advice given to you by your health care provider. Make sure you discuss any questions you have with your health care provider. Document Released: 05/07/2005 Document Revised: 11/25/2015 Document Reviewed: 10/24/2015 Elsevier Interactive Patient Education  2017 [employer].

## 2017-07-03 LAB — HIV ANTIBODY (ROUTINE TESTING W REFLEX): HIV SCREEN 4TH GENERATION: NONREACTIVE

## 2017-07-03 LAB — GC/CHLAMYDIA PROBE AMP (~~LOC~~) NOT AT ARMC
Chlamydia: NEGATIVE
NEISSERIA GONORRHEA: NEGATIVE

## 2017-07-04 LAB — CULTURE, OB URINE

## 2017-07-05 ENCOUNTER — Ambulatory Visit: Payer: BLUE CROSS/BLUE SHIELD | Admitting: General Practice

## 2017-07-05 DIAGNOSIS — O3680X0 Pregnancy with inconclusive fetal viability, not applicable or unspecified: Secondary | ICD-10-CM

## 2017-07-05 LAB — HCG, QUANTITATIVE, PREGNANCY: HCG, BETA CHAIN, QUANT, S: 1220 m[IU]/mL — AB (ref <5)

## 2017-07-05 NOTE — Progress Notes (Signed)
Patient here for stat bhcg today. Patient reports LLQ pain that kept her awake all night, currently rates pain at a 4. Patient denies bleeding. Discussed with patient we are monitoring her bhcg levels today and asked she wait in lobby for results/updated plan of care. Patient verbalized understanding and had no questions at this time  Reviewed results with Luna KitchensKathryn Kooistra who finds appropriate rise in bhcg levels- patient should have follow up ultrasound in 1 week. Scheduled for 2/20.  Informed patient of results & ultrasound appt. Ectopic precautions reviewed. Patient verbalized understanding & asked about safe cold medications. Reviewed approved cough/cold medications with patient. Patient verbalized understanding & had no questions

## 2017-07-06 NOTE — Progress Notes (Signed)
Chart reviewed for nurse visit. Agree with plan of care.   Marylene LandKooistra, Stana Bayon Lorraine, CNM 07/06/2017 5:48 PM

## 2017-07-10 ENCOUNTER — Ambulatory Visit: Payer: BLUE CROSS/BLUE SHIELD | Admitting: *Deleted

## 2017-07-10 ENCOUNTER — Ambulatory Visit (HOSPITAL_COMMUNITY)
Admission: RE | Admit: 2017-07-10 | Discharge: 2017-07-10 | Disposition: A | Discharge status: Home / Self Care | Payer: BLUE CROSS/BLUE SHIELD | Source: Ambulatory Visit | Attending: Student | Admitting: Student

## 2017-07-10 DIAGNOSIS — Z3A01 Less than 8 weeks gestation of pregnancy: Secondary | ICD-10-CM | POA: Insufficient documentation

## 2017-07-10 DIAGNOSIS — Z3401 Encounter for supervision of normal first pregnancy, first trimester: Secondary | ICD-10-CM | POA: Insufficient documentation

## 2017-07-10 DIAGNOSIS — O3680X0 Pregnancy with inconclusive fetal viability, not applicable or unspecified: Secondary | ICD-10-CM

## 2017-07-10 DIAGNOSIS — Z3490 Encounter for supervision of normal pregnancy, unspecified, unspecified trimester: Secondary | ICD-10-CM | POA: Diagnosis present

## 2017-07-10 NOTE — Progress Notes (Signed)
Here for us results. Reviewed with Judeth HornErin Lawrence, NP. Reviewed results with patient and informed her us does not yet show live baby. Judeth CornfieldStephanie c/o still  Having LLQ pain =3 today which is  alittle less than before. Denies bleeding. Per orders of Erin ordered non-stat bhcg today. Instructed patient to go to [facility] for severe pain or heavy bleeding.  Support given. Also signed up for MyChart . Instructed patient to call us if we do not call her with bhcg result. She voices understanding.

## 2017-07-10 NOTE — Progress Notes (Signed)
Chart reviewed for nurse visit. Agree with plan of care.   Judeth HornLawrence, Diamonds Lippard, NP 07/10/2017 10:14 AM

## 2017-07-11 ENCOUNTER — Telehealth: Payer: Self-pay | Admitting: *Deleted

## 2017-07-11 LAB — BETA HCG QUANT (REF LAB): HCG QUANT: 8261 m[IU]/mL

## 2017-07-11 NOTE — Telephone Encounter (Signed)
Received a call from Judeth HornErin Lawrence, NP that patients bhcg results back and due to only seeing gestational sac would like her to come in for stat hcg tomorrow. I called Judeth CornfieldStephanie and left message we need her to come in again tomorrow for another stat bhcg and to please call for appointment time. I also sent a message to front office to schedule her and may change time when she calls.

## 2017-07-11 NOTE — Telephone Encounter (Signed)
Rose CornfieldStephanie called back and I reviewed her labs with her and she agreed to 0830 stat bhcg tomorrow. States she is not bleeding, just having cramping=2.

## 2017-07-12 ENCOUNTER — Ambulatory Visit: Payer: BLUE CROSS/BLUE SHIELD | Admitting: *Deleted

## 2017-07-12 DIAGNOSIS — O3680X0 Pregnancy with inconclusive fetal viability, not applicable or unspecified: Secondary | ICD-10-CM

## 2017-07-12 LAB — HCG, QUANTITATIVE, PREGNANCY: HCG, BETA CHAIN, QUANT, S: 19952 m[IU]/mL — AB (ref <5)

## 2017-07-12 NOTE — Progress Notes (Signed)
Here for stat bhcg.  Denies pain or bleeding, only having nausea. Explained will draw stat bhcg and have her wait for results in lobby; then will discuss with provider and with her. She voices understanding.   Due to delay in obtaining results allowed Rose Manning to leave and be called with results with plan she would come back if needed.  Reviewed results with Dr. Debroah LoopArnold: good rise in bhcg, not concerned that it is ectopic . Needs repeat us in 10-14 days from last us to confirm viable pregnancy.    Called [name] and informed her bhcg had good rise and not as concerned that it is ectopic but more likely is early pregnancy and reccommend US to confirm if is viable pregnancy. Gave her appt fo us for 07/23/17 0900 and will come to office for results afterwards. She voices understanding.

## 2017-07-15 ENCOUNTER — Ambulatory Visit: Payer: BLUE CROSS/BLUE SHIELD

## 2017-07-18 DIAGNOSIS — N96 Recurrent pregnancy loss: Secondary | ICD-10-CM | POA: Insufficient documentation

## 2017-07-18 DIAGNOSIS — Z91013 Allergy to seafood: Secondary | ICD-10-CM | POA: Insufficient documentation

## 2017-07-18 DIAGNOSIS — F32A Depression, unspecified: Secondary | ICD-10-CM | POA: Insufficient documentation

## 2017-07-23 ENCOUNTER — Ambulatory Visit (INDEPENDENT_AMBULATORY_CARE_PROVIDER_SITE_OTHER): Payer: BLUE CROSS/BLUE SHIELD | Admitting: *Deleted

## 2017-07-23 ENCOUNTER — Ambulatory Visit (HOSPITAL_COMMUNITY)
Admission: RE | Admit: 2017-07-23 | Discharge: 2017-07-23 | Disposition: A | Discharge status: Home / Self Care | Payer: BLUE CROSS/BLUE SHIELD | Source: Ambulatory Visit | Attending: Obstetrics & Gynecology | Admitting: Obstetrics & Gynecology

## 2017-07-23 DIAGNOSIS — Z3490 Encounter for supervision of normal pregnancy, unspecified, unspecified trimester: Secondary | ICD-10-CM | POA: Diagnosis not present

## 2017-07-23 DIAGNOSIS — Z712 Person consulting for explanation of examination or test findings: Secondary | ICD-10-CM

## 2017-07-23 DIAGNOSIS — Z3A01 Less than 8 weeks gestation of pregnancy: Secondary | ICD-10-CM | POA: Diagnosis not present

## 2017-07-23 DIAGNOSIS — O3680X0 Pregnancy with inconclusive fetal viability, not applicable or unspecified: Secondary | ICD-10-CM

## 2017-08-11 ENCOUNTER — Inpatient Hospital Stay (HOSPITAL_COMMUNITY)
Admission: AD | Admit: 2017-08-11 | Discharge: 2017-08-12 | Disposition: A | Discharge status: Home / Self Care | Payer: BLUE CROSS/BLUE SHIELD | Source: Ambulatory Visit | Attending: Obstetrics and Gynecology | Admitting: Obstetrics and Gynecology

## 2017-08-11 DIAGNOSIS — N939 Abnormal uterine and vaginal bleeding, unspecified: Secondary | ICD-10-CM | POA: Diagnosis present

## 2017-08-11 DIAGNOSIS — Z8619 Personal history of other infectious and parasitic diseases: Secondary | ICD-10-CM | POA: Insufficient documentation

## 2017-08-11 DIAGNOSIS — R109 Unspecified abdominal pain: Secondary | ICD-10-CM | POA: Diagnosis present

## 2017-08-11 DIAGNOSIS — O99211 Obesity complicating pregnancy, first trimester: Secondary | ICD-10-CM | POA: Insufficient documentation

## 2017-08-11 DIAGNOSIS — Z8249 Family history of ischemic heart disease and other diseases of the circulatory system: Secondary | ICD-10-CM | POA: Diagnosis not present

## 2017-08-11 DIAGNOSIS — Z3A11 11 weeks gestation of pregnancy: Secondary | ICD-10-CM | POA: Insufficient documentation

## 2017-08-11 DIAGNOSIS — Z9049 Acquired absence of other specified parts of digestive tract: Secondary | ICD-10-CM | POA: Diagnosis not present

## 2017-08-11 DIAGNOSIS — O131 Gestational [pregnancy-induced] hypertension without significant proteinuria, first trimester: Secondary | ICD-10-CM | POA: Diagnosis not present

## 2017-08-11 DIAGNOSIS — Z91013 Allergy to seafood: Secondary | ICD-10-CM | POA: Insufficient documentation

## 2017-08-11 DIAGNOSIS — O200 Threatened abortion: Secondary | ICD-10-CM

## 2017-08-11 DIAGNOSIS — O039 Complete or unspecified spontaneous abortion without complication: Secondary | ICD-10-CM | POA: Insufficient documentation

## 2017-08-11 DIAGNOSIS — O021 Missed abortion: Secondary | ICD-10-CM

## 2017-08-11 NOTE — MAU Note (Signed)
Pt reports some vaginal bleeding when wiping that started around 10pm. Pt did not pass any clots. Pt reports some LLQ pain that started soon after the bleeding. Rates pain 4/10. Did not take anything. Pt denies vaginal exams or recent intercourse.

## 2017-08-11 NOTE — MAU Provider Note (Signed)
Chief Complaint: Abdominal Pain and Vaginal Bleeding   First Provider Initiated Contact with Patient 08/11/17 2342     SUBJECTIVE HPI: Rose Manning is a 27 y.o. [id number] at [redacted]w[redacted]d who presents to [facility] reporting bleeding when wiping tonight.  States having some cramping as well.  Denies intercourse.  Has hx of multiple SAB  Location: vaginal bleeding Quality: small amount Severity:4/10 on pain scale Duration 2 hours Context:After going to [facility]    Past Medical History:  Diagnosis Date  . Hx of varicella   . Migraine   . Obesity   . Pregnancy induced hypertension    OB History  Gravida Para Term Preterm AB Living  6 1 1   4 1   SAB TAB Ectopic Multiple Live Births  3 1     1     # Outcome Date GA Lbr Len/2nd Weight Sex Delivery Anes PTL Lv  6 Current           5 TAB 01/2015          4 SAB 2015          3 Term 03/25/13 [redacted]w[redacted]d 08:10 / 00:24 2.955 kg (6 lb 8.2 oz) M Vag-Spont EPI  LIV  2 SAB 2012          1 SAB 2011           Past Surgical History:  Procedure Laterality Date  . CHOLECYSTECTOMY  01/2009  . WISDOM TOOTH EXTRACTION     Social History   Socioeconomic History  . Marital status: Legally Separated    Spouse name: Not on file  . Number of children: Not on file  . Years of education: Not on file  . Highest education level: Not on file  Occupational History  . Not on file  Social Needs  . Financial resource strain: Not on file  . Food insecurity:    Worry: Not on file    Inability: Not on file  . Transportation needs:    Medical: Not on file    Non-medical: Not on file  Tobacco Use  . Smoking status: Never Smoker  . Smokeless tobacco: Never Used  Substance and Sexual Activity  . Alcohol use: No    Comment: occasionally  . Drug use: No  . Sexual activity: Yes  Lifestyle  . Physical activity:    Days per week: Not on file    Minutes per session: Not on file  . Stress: Not on file  Relationships  . Social connections:    Talks on  phone: Not on file    Gets together: Not on file    Attends religious service: Not on file    Active member of club or organization: Not on file    Attends meetings of clubs or organizations: Not on file    Relationship status: Not on file  . Intimate partner violence:    Fear of current or ex partner: Not on file    Emotionally abused: Not on file    Physically abused: Not on file    Forced sexual activity: Not on file  Other Topics Concern  . Not on file  Social History Narrative  . Not on file   Family History  Problem Relation Age of Onset  . Hypertension Mother   . Urolithiasis Mother   . Migraines Mother   . Hypertension Father   . Hypertension Maternal Grandmother   . Hypertension Maternal Grandfather   . Alzheimer's disease Maternal Grandfather   .  Hypertension Paternal Grandmother   . Hypertension Paternal Grandfather   . Heart disease Paternal Grandfather   . Lupus Maternal Aunt   . Rheum arthritis Maternal Aunt   . Diabetes Cousin   . Anesthesia problems Neg Hx    No current facility-administered medications on file prior to encounter.    Current Outpatient Medications on File Prior to Encounter  Medication Sig Dispense Refill  . butalbital-acetaminophen-caffeine (FIORICET, ESGIC) 50-325-40 MG tablet Take 1-2 tablets by mouth every 6 (six) hours as needed for headache. (Patient not taking: Reported on 07/23/2017) 20 tablet 0  . cephALEXin (KEFLEX) 500 MG capsule Take 1 capsule (500 mg total) by mouth 4 (four) times daily. (Patient not taking: Reported on 06/20/2016) 28 capsule 0  . EPINEPHrine (EPIPEN) 0.3 mg/0.3 mL SOAJ injection Inject 0.3 mLs (0.3 mg total) into the muscle as needed. (Patient not taking: Reported on 07/23/2017) 2 Device 0  . ibuprofen (CHILD IBUPROFEN) 100 MG/5ML suspension Take 30 mLs (600 mg total) by mouth every 6 (six) hours as needed. (Patient not taking: Reported on 07/23/2017) 273 mL 1  . magic mouthwash SOLN Take 10 mLs by mouth 3 (three) times  daily as needed for mouth pain. (Patient not taking: Reported on 07/23/2017) 150 mL 0  . Prenatal Vit-Fe Fumarate-FA (PRENATAL MULTIVITAMIN) TABS tablet Take 1 tablet by mouth daily at 12 noon.     Allergies  Allergen Reactions  . Shellfish Allergy Hives    I have reviewed patient's Past Medical Hx, Surgical Hx, Family Hx, Social Hx, medications and allergies.   Review of Systems  Constitutional: Negative.   HENT: Negative.   Eyes: Negative.   Respiratory: Negative.   Cardiovascular: Negative.   Gastrointestinal: Negative.   Endocrine: Negative.   Genitourinary: Positive for vaginal bleeding.  Musculoskeletal: Negative.   Skin: Negative.   Allergic/Immunologic: Negative.   Neurological: Negative.   Hematological: Negative.   Psychiatric/Behavioral: Negative.     OBJECTIVE Patient Vitals for the past 24 hrs:  BP Temp Temp src Pulse Resp SpO2 Height Weight  08/11/17 2316 (!) 144/87 99 F (37.2 C) Oral 78 19 99 % 5\' 5"  (1.651 m) (!) 152 kg (335 lb)   Constitutional: Well-developed, well-nourished female in no acute distress.  Cardiovascular: normal rate Respiratory: normal rate and effort.  GI: Abd soft, non-tender, gravid appropriate for gestational age.  MS: Extremities nontender, no edema, normal ROM Neurologic: Alert and oriented x 4.  GU: Neg CVAT.  SPECULUM EXAM: NEFG, physiologic discharge, small amount of blood noted, cervix clean  BIMANUAL: cervix closed; uterus 10 week size, no adnexal tenderness or masses.  No CMT. Unable to hear FHT with doppler LAB RESULTS   IMAGING Single intrauterine pregnancy with an estimated gestational age of [redacted] weeks, 2 days. No fetal cardiac activity identified consistent with embryonic demise. Clinical correlation recommended.  MAU COURSE Orders Placed This Encounter  Procedures  . Wet prep, genital  . US OB Comp Less 14 Wks   No orders of the defined types were placed in this encounter.   MDM PE, pelvic and US.  US showed  an embryonic demise.  Will discharge pt home and have office schedule a D and E. ASSESSMENT Inevitable abortion  PLAN Discussed options of expected management, chemical or surgical removal.  Risk and benefits of each method discussed.  Pt wishes to proceed with D and E.  Will have office schedule.  Discussed danger of too much bleeding and Motrin for discomfort.  Pt verbalized understanding of plans.  Due to pt multiple AB offered work up after for recurrent losses.      Kenney Houseman, CNM 08/11/2017  11:55 PM

## 2017-08-11 NOTE — MAU Note (Signed)
Urine sent to lab 

## 2017-08-12 ENCOUNTER — Other Ambulatory Visit: Payer: Self-pay | Admitting: Obstetrics & Gynecology

## 2017-08-12 ENCOUNTER — Encounter (HOSPITAL_COMMUNITY): Payer: Self-pay | Admitting: *Deleted

## 2017-08-12 ENCOUNTER — Other Ambulatory Visit: Payer: Self-pay

## 2017-08-12 ENCOUNTER — Inpatient Hospital Stay (HOSPITAL_COMMUNITY): Payer: BLUE CROSS/BLUE SHIELD

## 2017-08-12 LAB — WET PREP, GENITAL
Sperm: NONE SEEN
Trich, Wet Prep: NONE SEEN
Yeast Wet Prep HPF POC: NONE SEEN

## 2017-08-14 ENCOUNTER — Ambulatory Visit (HOSPITAL_COMMUNITY): Payer: BLUE CROSS/BLUE SHIELD | Admitting: Anesthesiology

## 2017-08-14 ENCOUNTER — Encounter (HOSPITAL_COMMUNITY): Payer: Self-pay

## 2017-08-14 ENCOUNTER — Encounter (HOSPITAL_COMMUNITY)
Admission: RE | Disposition: A | Discharge status: Home / Self Care | Payer: Self-pay | Source: Ambulatory Visit | Attending: Obstetrics & Gynecology

## 2017-08-14 ENCOUNTER — Other Ambulatory Visit: Payer: Self-pay

## 2017-08-14 ENCOUNTER — Ambulatory Visit (HOSPITAL_COMMUNITY)
Admission: RE | Admit: 2017-08-14 | Discharge: 2017-08-14 | Disposition: A | Discharge status: Home / Self Care | Payer: BLUE CROSS/BLUE SHIELD | Source: Ambulatory Visit | Attending: Obstetrics & Gynecology | Admitting: Obstetrics & Gynecology

## 2017-08-14 DIAGNOSIS — G43909 Migraine, unspecified, not intractable, without status migrainosus: Secondary | ICD-10-CM | POA: Diagnosis not present

## 2017-08-14 DIAGNOSIS — Z9049 Acquired absence of other specified parts of digestive tract: Secondary | ICD-10-CM | POA: Diagnosis not present

## 2017-08-14 DIAGNOSIS — Z6841 Body Mass Index (BMI) 40.0 and over, adult: Secondary | ICD-10-CM | POA: Insufficient documentation

## 2017-08-14 DIAGNOSIS — Z8261 Family history of arthritis: Secondary | ICD-10-CM | POA: Diagnosis not present

## 2017-08-14 DIAGNOSIS — O99341 Other mental disorders complicating pregnancy, first trimester: Secondary | ICD-10-CM | POA: Diagnosis not present

## 2017-08-14 DIAGNOSIS — E669 Obesity, unspecified: Secondary | ICD-10-CM | POA: Insufficient documentation

## 2017-08-14 DIAGNOSIS — O021 Missed abortion: Secondary | ICD-10-CM | POA: Diagnosis not present

## 2017-08-14 DIAGNOSIS — Z8489 Family history of other specified conditions: Secondary | ICD-10-CM | POA: Diagnosis not present

## 2017-08-14 DIAGNOSIS — Z8249 Family history of ischemic heart disease and other diseases of the circulatory system: Secondary | ICD-10-CM | POA: Diagnosis not present

## 2017-08-14 DIAGNOSIS — Z3A08 8 weeks gestation of pregnancy: Secondary | ICD-10-CM | POA: Diagnosis not present

## 2017-08-14 DIAGNOSIS — Z820 Family history of epilepsy and other diseases of the nervous system: Secondary | ICD-10-CM | POA: Diagnosis not present

## 2017-08-14 DIAGNOSIS — F419 Anxiety disorder, unspecified: Secondary | ICD-10-CM | POA: Insufficient documentation

## 2017-08-14 DIAGNOSIS — O99211 Obesity complicating pregnancy, first trimester: Secondary | ICD-10-CM | POA: Diagnosis not present

## 2017-08-14 DIAGNOSIS — O99351 Diseases of the nervous system complicating pregnancy, first trimester: Secondary | ICD-10-CM | POA: Insufficient documentation

## 2017-08-14 DIAGNOSIS — O089 Unspecified complication following an ectopic and molar pregnancy: Secondary | ICD-10-CM | POA: Insufficient documentation

## 2017-08-14 DIAGNOSIS — F329 Major depressive disorder, single episode, unspecified: Secondary | ICD-10-CM | POA: Insufficient documentation

## 2017-08-14 HISTORY — DX: Anxiety disorder, unspecified: F41.9

## 2017-08-14 HISTORY — DX: Nontoxic single thyroid nodule: E04.1

## 2017-08-14 HISTORY — PX: DILATION AND EVACUATION: SHX1459

## 2017-08-14 HISTORY — DX: Major depressive disorder, single episode, unspecified: F32.9

## 2017-08-14 HISTORY — DX: Depression, unspecified: F32.A

## 2017-08-14 LAB — CBC
HEMATOCRIT: 36.3 % (ref 36.0–46.0)
HEMOGLOBIN: 11.9 g/dL — AB (ref 12.0–15.0)
MCH: 24.7 pg — ABNORMAL LOW (ref 26.0–34.0)
MCHC: 32.8 g/dL (ref 30.0–36.0)
MCV: 75.3 fL — ABNORMAL LOW (ref 78.0–100.0)
Platelets: 256 10*3/uL (ref 150–400)
RBC: 4.82 MIL/uL (ref 3.87–5.11)
RDW: 16.8 % — ABNORMAL HIGH (ref 11.5–15.5)
WBC: 10.6 10*3/uL — AB (ref 4.0–10.5)

## 2017-08-14 SURGERY — DILATION AND EVACUATION, UTERUS
Anesthesia: General | Site: Vagina | Laterality: Bilateral

## 2017-08-14 MED ORDER — FENTANYL CITRATE (PF) 100 MCG/2ML IJ SOLN
INTRAMUSCULAR | Status: AC
  Filled 2017-08-14: qty 2

## 2017-08-14 MED ORDER — HYDROMORPHONE HCL 1 MG/ML IJ SOLN
0.2500 mg | INTRAMUSCULAR | Status: DC | PRN
  Administered 2017-08-14: 0.5 mg via INTRAVENOUS

## 2017-08-14 MED ORDER — PROMETHAZINE HCL 25 MG/ML IJ SOLN
INTRAMUSCULAR | Status: AC
  Filled 2017-08-14: qty 1

## 2017-08-14 MED ORDER — MIDAZOLAM HCL 2 MG/2ML IJ SOLN
INTRAMUSCULAR | Status: AC
  Filled 2017-08-14: qty 2

## 2017-08-14 MED ORDER — PROPOFOL 10 MG/ML IV BOLUS
INTRAVENOUS | Status: DC | PRN
  Administered 2017-08-14: 200 mg via INTRAVENOUS

## 2017-08-14 MED ORDER — LACTATED RINGERS IV SOLN
INTRAVENOUS | Status: DC
  Administered 2017-08-14: 125 mL/h via INTRAVENOUS
  Administered 2017-08-14: 10:00:00 via INTRAVENOUS

## 2017-08-14 MED ORDER — SCOPOLAMINE 1 MG/3DAYS TD PT72
MEDICATED_PATCH | TRANSDERMAL | Status: AC
  Administered 2017-08-14: 1.5 mg via TRANSDERMAL
  Filled 2017-08-14: qty 1

## 2017-08-14 MED ORDER — KETOROLAC TROMETHAMINE 30 MG/ML IJ SOLN
INTRAMUSCULAR | Status: DC | PRN
  Administered 2017-08-14: 30 mg via INTRAVENOUS

## 2017-08-14 MED ORDER — HYDROMORPHONE HCL 1 MG/ML IJ SOLN
INTRAMUSCULAR | Status: AC
  Filled 2017-08-14: qty 1

## 2017-08-14 MED ORDER — IBUPROFEN 800 MG PO TABS: 800.0000 mg | ORAL_TABLET | Freq: Three times a day (TID) | ORAL | 0 refills | Status: DC

## 2017-08-14 MED ORDER — MIDAZOLAM HCL 2 MG/2ML IJ SOLN
INTRAMUSCULAR | Status: DC | PRN
  Administered 2017-08-14: 2 mg via INTRAVENOUS

## 2017-08-14 MED ORDER — DEXAMETHASONE SODIUM PHOSPHATE 4 MG/ML IJ SOLN
INTRAMUSCULAR | Status: AC
  Filled 2017-08-14: qty 1

## 2017-08-14 MED ORDER — ONDANSETRON HCL 4 MG/2ML IJ SOLN
INTRAMUSCULAR | Status: DC | PRN
  Administered 2017-08-14: 4 mg via INTRAVENOUS

## 2017-08-14 MED ORDER — PROMETHAZINE HCL 25 MG/ML IJ SOLN
6.2500 mg | INTRAMUSCULAR | Status: DC | PRN
  Administered 2017-08-14: 12.5 mg via INTRAVENOUS

## 2017-08-14 MED ORDER — LIDOCAINE HCL 1 % IJ SOLN
INTRAMUSCULAR | Status: AC
  Filled 2017-08-14: qty 20

## 2017-08-14 MED ORDER — MEPERIDINE HCL 25 MG/ML IJ SOLN: 6.2500 mg | INTRAMUSCULAR | Status: DC | PRN

## 2017-08-14 MED ORDER — OXYCODONE HCL 5 MG/5ML PO SOLN: 5.0000 mg | Freq: Once | ORAL | Status: DC | PRN

## 2017-08-14 MED ORDER — DOXYCYCLINE HYCLATE 100 MG IV SOLR
200.0000 mg | Freq: Once | INTRAVENOUS | Status: AC
  Administered 2017-08-14: 200 mg via INTRAVENOUS
  Filled 2017-08-14: qty 200

## 2017-08-14 MED ORDER — BUPIVACAINE HCL (PF) 0.5 % IJ SOLN
INTRAMUSCULAR | Status: AC
  Filled 2017-08-14: qty 30

## 2017-08-14 MED ORDER — OXYCODONE HCL 5 MG PO TABS: 5.0000 mg | ORAL_TABLET | Freq: Once | ORAL | Status: DC | PRN

## 2017-08-14 MED ORDER — LIDOCAINE HCL (CARDIAC) 20 MG/ML IV SOLN
INTRAVENOUS | Status: DC | PRN
  Administered 2017-08-14: 50 mg via INTRAVENOUS

## 2017-08-14 MED ORDER — DEXAMETHASONE SODIUM PHOSPHATE 10 MG/ML IJ SOLN
INTRAMUSCULAR | Status: DC | PRN
  Administered 2017-08-14: 4 mg via INTRAVENOUS

## 2017-08-14 MED ORDER — KETOROLAC TROMETHAMINE 30 MG/ML IJ SOLN: 30.0000 mg | Freq: Once | INTRAMUSCULAR | Status: DC | PRN

## 2017-08-14 MED ORDER — SCOPOLAMINE 1 MG/3DAYS TD PT72
1.0000 | MEDICATED_PATCH | Freq: Once | TRANSDERMAL | Status: DC
  Administered 2017-08-14: 1.5 mg via TRANSDERMAL

## 2017-08-14 MED ORDER — FENTANYL CITRATE (PF) 100 MCG/2ML IJ SOLN
INTRAMUSCULAR | Status: DC | PRN
  Administered 2017-08-14 (×2): 50 ug via INTRAVENOUS

## 2017-08-14 MED ORDER — ONDANSETRON HCL 4 MG/2ML IJ SOLN
INTRAMUSCULAR | Status: AC
  Filled 2017-08-14: qty 2

## 2017-08-14 MED ORDER — OXYCODONE-ACETAMINOPHEN 2.5-325 MG PO TABS: 1.0000 | ORAL_TABLET | ORAL | 0 refills | Status: DC | PRN

## 2017-08-14 MED ORDER — KETOROLAC TROMETHAMINE 30 MG/ML IJ SOLN
INTRAMUSCULAR | Status: AC
  Filled 2017-08-14: qty 1

## 2017-08-14 MED ORDER — BUPIVACAINE-EPINEPHRINE 0.5% -1:200000 IJ SOLN
INTRAMUSCULAR | Status: DC | PRN
  Administered 2017-08-14: 10 mL

## 2017-08-14 MED ORDER — PROPOFOL 10 MG/ML IV BOLUS
INTRAVENOUS | Status: AC
  Filled 2017-08-14: qty 20

## 2017-08-14 SURGICAL SUPPLY — 18 items
CATH ROBINSON RED A/P 16FR (CATHETERS) ×3 IMPLANT
DECANTER SPIKE VIAL GLASS SM (MISCELLANEOUS) ×3 IMPLANT
GLOVE BIO SURGEON STRL SZ 6.5 (GLOVE) ×2 IMPLANT
GLOVE BIO SURGEONS STRL SZ 6.5 (GLOVE) ×1
GLOVE BIOGEL PI IND STRL 7.0 (GLOVE) ×2 IMPLANT
GLOVE BIOGEL PI INDICATOR 7.0 (GLOVE) ×4
GOWN STRL REUS W/TWL LRG LVL3 (GOWN DISPOSABLE) ×6 IMPLANT
KIT BERKELEY 1ST TRIMESTER 3/8 (MISCELLANEOUS) ×3 IMPLANT
NS IRRIG 1000ML POUR BTL (IV SOLUTION) ×3 IMPLANT
PACK VAGINAL MINOR WOMEN LF (CUSTOM PROCEDURE TRAY) ×3 IMPLANT
PAD OB MATERNITY 4.3X12.25 (PERSONAL CARE ITEMS) ×3 IMPLANT
PAD PREP 24X48 CUFFED NSTRL (MISCELLANEOUS) ×3 IMPLANT
SET BERKELEY SUCTION TUBING (SUCTIONS) ×3 IMPLANT
TOWEL OR 17X24 6PK STRL BLUE (TOWEL DISPOSABLE) ×6 IMPLANT
VACURETTE 10 RIGID CVD (CANNULA) IMPLANT
VACURETTE 7MM CVD STRL WRAP (CANNULA) ×3 IMPLANT
VACURETTE 8 RIGID CVD (CANNULA) IMPLANT
VACURETTE 9 RIGID CVD (CANNULA) IMPLANT

## 2017-08-14 NOTE — Discharge Instructions (Signed)
DISCHARGE INSTRUCTIONS: D&C / D&E The following instructions have been prepared to help you care for yourself upon your return home.   Personal hygiene:  Use sanitary pads for vaginal drainage, not tampons.  Shower the day after your procedure.  NO tub baths, pools or Jacuzzis for 2-3 weeks.  Wipe front to back after using the bathroom.  Activity and limitations:  Do NOT drive or operate any equipment for 24 hours. The effects of anesthesia are still present and drowsiness may result.  Do NOT rest in bed all day.  Walking is encouraged.  Walk up and down stairs slowly.  You may resume your normal activity in one to two days or as indicated by your physician.  Sexual activity: NO intercourse for at least 2 weeks after the procedure, or as indicated by your physician.  Diet: Eat a light meal as desired this evening. You may resume your usual diet tomorrow.  Return to work: You may resume your work activities in one to two days or as indicated by your doctor.  What to expect after your surgery: Expect to have vaginal bleeding/discharge for 2-3 days and spotting for up to 10 days. It is not unusual to have soreness for up to 1-2 weeks. You may have a slight burning sensation when you urinate for the first day. Mild cramps may continue for a couple of days. You may have a regular period in 2-6 weeks.  Call your doctor for any of the following:  Excessive vaginal bleeding, saturating and changing one pad every hour.  Inability to urinate 6 hours after discharge from hospital.  Pain not relieved by pain medication.  Fever of 100.4 F or greater.  Unusual vaginal discharge or odor.   Call for an appointment:    Patients signature: ______________________  Nurses signature ________________________  Support person's signature_______________________   Post Anesthesia Home Care Instructions  NO IBUPROFEN PRODUCTS UNTIL: 4:30 PM TODAY  Activity: Get plenty of rest for  the remainder of the day. A responsible individual must stay with you for 24 hours following the procedure.  For the next 24 hours, DO NOT: -Drive a car -Advertising copywriterperate machinery -Drink alcoholic beverages -Take any medication unless instructed by your physician -Make any legal decisions or sign important papers.  Meals: Start with liquid foods such as gelatin or soup. Progress to regular foods as tolerated. Avoid greasy, spicy, heavy foods. If nausea and/or vomiting occur, drink only clear liquids until the nausea and/or vomiting subsides. Call your physician if vomiting continues.  Special Instructions/Symptoms: Your throat may feel dry or sore from the anesthesia or the breathing tube placed in your throat during surgery. If this causes discomfort, gargle with warm salt water. The discomfort should disappear within 24 hours.  If you had a scopolamine patch placed behind your ear for the management of post- operative nausea and/or vomiting:  1. The medication in the patch is effective for 72 hours, after which it should be removed.  Wrap patch in a tissue and discard in the trash. Wash hands thoroughly with soap and water. 2. You may remove the patch earlier than 72 hours if you experience unpleasant side effects which may include dry mouth, dizziness or visual disturbances. 3. Avoid touching the patch. Wash your hands with soap and water after contact with the patch.

## 2017-08-14 NOTE — Anesthesia Postprocedure Evaluation (Signed)
Anesthesia Post Note  Patient: Rose SievertStephanie Stephens  Procedure(s) Performed: Suction DILATATION AND EVACUATION (Bilateral Vagina )     Patient location during evaluation: PACU Anesthesia Type: General Level of consciousness: sedated and patient cooperative Pain management: pain level controlled Vital Signs Assessment: post-procedure vital signs reviewed and stable Respiratory status: spontaneous breathing Cardiovascular status: stable Anesthetic complications: no    Last Vitals:  Vitals:   08/14/17 1230 08/14/17 1310  BP: 130/80 (!) 158/83  Pulse: (!) 57 68  Resp: 17 16  Temp: 37.1 C 37.2 C  SpO2: 93% 98%    Last Pain:  Vitals:   08/14/17 1310  TempSrc:   PainSc: 3    Pain Goal: Patients Stated Pain Goal: 4 (08/14/17 1310)               Lewie LoronJohn Sondi Desch

## 2017-08-14 NOTE — Anesthesia Preprocedure Evaluation (Signed)
Anesthesia Evaluation  Patient identified by MRN, date of birth, ID band Patient awake    Reviewed: Allergy & Precautions, H&P , Patient's Chart, lab work & pertinent test results  Airway Mallampati: III  TM Distance: >3 FB Neck ROM: Full    Dental no notable dental hx. (+) Teeth Intact   Pulmonary neg pulmonary ROS,    Pulmonary exam normal breath sounds clear to auscultation       Cardiovascular hypertension, Normal cardiovascular exam Rhythm:Regular Rate:Normal  PIH   Neuro/Psych  Headaches, PSYCHIATRIC DISORDERS Anxiety Depression    GI/Hepatic negative GI ROS, Neg liver ROS,   Endo/Other  Morbid obesity  Renal/GU negative Renal ROS     Musculoskeletal negative musculoskeletal ROS (+)   Abdominal (+) + obese,   Peds  Hematology negative hematology ROS (+)   Anesthesia Other Findings   Reproductive/Obstetrics (+) Pregnancy                             Anesthesia Physical  Anesthesia Plan  ASA: III  Anesthesia Plan: General   Post-op Pain Management:    Induction: Intravenous  PONV Risk Score and Plan: 4 or greater and 3 and Ondansetron, Dexamethasone, Scopolamine patch - Pre-op and Midazolam  Airway Management Planned: LMA  Additional Equipment:   Intra-op Plan:   Post-operative Plan: Extubation in OR  Informed Consent: I have reviewed the patients History and Physical, chart, labs and discussed the procedure including the risks, benefits and alternatives for the proposed anesthesia with the patient or authorized representative who has indicated his/her understanding and acceptance.   Dental advisory given  Plan Discussed with: CRNA  Anesthesia Plan Comments:         Anesthesia Quick Evaluation

## 2017-08-14 NOTE — Interval H&P Note (Signed)
History and Physical Interval Note:  08/14/2017 9:50 AM  Rose Manning  has presented today for surgery, with the diagnosis of Missed Abortion  The various methods of treatment have been discussed with the patient and family. After consideration of risks, benefits and other options for treatment, the patient has consented to  Procedure(s): Suction DILATATION AND EVACUATION (Bilateral) as a surgical intervention .  The patient's history has been reviewed, patient examined, no change in status, stable for surgery.  I have reviewed the patient's chart and labs.  Questions were answered to the patient's satisfaction.     Konrad FelixKULWA,Roland Prine WAKURU, MD.

## 2017-08-14 NOTE — Op Note (Signed)
  Rose Manning, [name], 27 y.o., 29-Mar-1991  MRN [id number]  Date of birth: 08/14/2019  Date of procedure: 08/14/2017  Procedure: Suction Dilation and Currettage.   Preop diagnosis: Missed abortion at 8 weeks 2 days EGA.  Patient supposed to be 10 W EGA by dates.  Postop diagnosis: Missed abortion  at 8 weeks 2 days EGA.  Patient supposed to be 10 W EGA by dates.  Surgeon: Dr. Hoover BrownsEMA Nephtali Docken.  Assistants: None  Anesthesia: General  Complications: None  Input: per anesthesia  Output: EBL: 75cc    Findings: 8 week size anteverted uterus. No palpable adnexal masses bilaterally. Cervix dilated with small blood coming through os.       Indications: 27 year-old G6 para 1051 who was supposed to be 10 weeks by LMP who was found to have a missed abortion at 8 weeks by ultrasound at the hospital.  The patient's desired surgical management of the missed abortion. We discussed risks benefits and alternatives of the procedure. She is known blood group O positive  Procedure:  Informed consent was obtained from the patient to undergo the procedure. She was taken to the operating room anesthesia was administered without difficulty. Doxycycline 200 mg IV was given preoperatively.  Straight catheterization was performed.  She was placed in the dorsal lithotomy position and prepped and draped in the usual sterile fashion. An exam under anesthesia revealed the above.  A speculum was used to view the cervix. The Ring forceps was used to hold the cervix in place.  The cervix was already dilated.  The suction curette #7 curved was then advanced to the fundus and the uterus cleared of the products of conception with 3 passes of the suction curette.  Gentle sharp curettage was then performed and the 4 quadrants were noted to be gritty.  The suction curette was once again passed into the uterus to clear it of the remaining products of conception. Good hemostasis was noted.  The instruments were then removed and  uterus massaged.  The patient was then awoken from anesthesia, she was cleaned and taken to recovery room in stable condition  Specimen: Products of conception to pathology and also for genetic testing as she desires recurrent pregnancy loss work up.     Dr. Sallye OberKulwa.

## 2017-08-14 NOTE — Transfer of Care (Signed)
Immediate Anesthesia Transfer of Care Note  Patient: Rose SievertStephanie Manning  Procedure(s) Performed: Suction DILATATION AND EVACUATION (Bilateral Vagina )  Patient Location: PACU  Anesthesia Type:General  Level of Consciousness: awake, alert  and oriented  Airway & Oxygen Therapy: Patient Spontanous Breathing and Patient connected to nasal cannula oxygen  Post-op Assessment: Report given to RN and Post -op Vital signs reviewed and stable  Post vital signs: Reviewed and stable  Last Vitals:  Vitals Value Taken Time  BP 116/79 08/14/2017 10:36 AM  Temp    Pulse 67 08/14/2017 10:38 AM  Resp 18 08/14/2017 10:38 AM  SpO2 99 % 08/14/2017 10:38 AM  Vitals shown include unvalidated device data.  Last Pain:  Vitals:   08/14/17 0920  TempSrc:   PainSc: 4       Patients Stated Pain Goal: 4 (08/14/17 0920)  Complications: No apparent anesthesia complications

## 2017-08-14 NOTE — H&P (Signed)
Rose Manning is an 27 y.o. female with a missed abortion at 8 weeks 2 days EGA here for a suction dilation and currettage.  Pertinent Gynecological History: Contraception: none DES exposure: unknown Blood transfusions: none Sexually transmitted diseases: no past history Previous GYN Procedures: None  Last pap: normal Date:  OB History: G1, P0   Menstrual History: Patient's last menstrual period was 05/24/2017.    Past Medical History:  Diagnosis Date  . Anxiety   . Depression   . Hx of varicella   . Migraine   . Obesity   . Pregnancy induced hypertension   . Thyroid cyst     Past Surgical History:  Procedure Laterality Date  . CHOLECYSTECTOMY  01/2009  . WISDOM TOOTH EXTRACTION      Family History  Problem Relation Age of Onset  . Hypertension Mother   . Urolithiasis Mother   . Migraines Mother   . Hypertension Father   . Hypertension Maternal Grandmother   . Hypertension Maternal Grandfather   . Alzheimer's disease Maternal Grandfather   . Hypertension Paternal Grandmother   . Hypertension Paternal Grandfather   . Heart disease Paternal Grandfather   . Lupus Maternal Aunt   . Rheum arthritis Maternal Aunt   . Diabetes Cousin   . Anesthesia problems Neg Hx     Social History:  reports that she has never smoked. She has never used smokeless tobacco. She reports that she does not drink alcohol or use drugs.  Allergies:  Allergies  Allergen Reactions  . Shellfish Allergy Hives    Medications Prior to Admission  Medication Sig Dispense Refill Last Dose  . sertraline (ZOLOFT) 50 MG tablet Take 50 mg by mouth daily.   Past Month at Unknown time  . butalbital-acetaminophen-caffeine (FIORICET, ESGIC) 50-325-40 MG tablet Take 1-2 tablets by mouth every 6 (six) hours as needed for headache. (Patient not taking: Reported on 07/23/2017) 20 tablet 0 Not Taking at Unknown time  . EPINEPHrine (EPIPEN) 0.3 mg/0.3 mL SOAJ injection Inject 0.3 mLs (0.3 mg total) into  the muscle as needed. (Patient not taking: Reported on 07/23/2017) 2 Device 0 Not Taking at Unknown time  . ibuprofen (CHILD IBUPROFEN) 100 MG/5ML suspension Take 30 mLs (600 mg total) by mouth every 6 (six) hours as needed. (Patient not taking: Reported on 07/23/2017) 273 mL 1 Completed Course at Unknown time    ROS  Blood pressure 127/90, pulse 81, temperature 98.5 F (36.9 C), temperature source Oral, resp. rate 20, height 5\' 5"  (1.651 m), weight (!) 152 kg (335 lb), last menstrual period 05/24/2017, SpO2 99 %. Physical Exam Constitutional: She is oriented to person, place, and time. She appears well-developed and well-nourished.  HENT:  Head: Normocephalic and atraumatic.  Neck: Normal range of motion.  Cardiovascular: Normal rate.   Respiratory: Effort normal and breath sounds normal.  GI: Soft. Bowel sounds are normal.  Neurological: She is alert and oriented to person, place, and time.  Skin: Skin is warm and dry.  Psychiatric: She has a normal mood and affect. Her behavior is normal.   Results for orders placed or performed during the hospital encounter of 08/14/17 (from the past 24 hour(s))  CBC     Status: Abnormal   Collection Time: 08/14/17  7:15 AM  Result Value Ref Range   WBC 10.6 (H) 4.0 - 10.5 K/uL   RBC 4.82 3.87 - 5.11 MIL/uL   Hemoglobin 11.9 (L) 12.0 - 15.0 g/dL   HCT 60.4 54.0 - 98.1 %  MCV 75.3 (L) 78.0 - 100.0 fL   MCH 24.7 (L) 26.0 - 34.0 pg   MCHC 32.8 30.0 - 36.0 g/dL   RDW 16.116.8 (H) 09.611.5 - 04.515.5 %   Platelets 256 150 - 400 K/uL   Assessment/Plan: 27 y/o with missed abortion, here for suction dilation and currettage  -Admit to Day surgery -NPO and IVF -Preop doxycycline.  Rose Manning,Rose Dumais WAKURU, MD.  08/14/2017, 9:45 AM

## 2017-08-14 NOTE — Brief Op Note (Signed)
08/14/2017  11:21 AM  PATIENT:  Rose SievertStephanie Manning  27 y.o. female  PRE-OPERATIVE DIAGNOSIS:  Missed Abortion  POST-OPERATIVE DIAGNOSIS:  missed abortion  PROCEDURE:  Procedure(s): Suction DILATATION AND EVACUATION (Bilateral)  SURGEON:  Surgeon(s) and Role:    * Hoover BrownsKulwa, Norah Devin, MD - Primary  ASSISTANTS: None   ANESTHESIA:   general  EBL:  75 mL   BLOOD ADMINISTERED:none  DRAINS: none   LOCAL MEDICATIONS USED:  MARCAINE     SPECIMEN:  Source of Specimen:  Products of conception  DISPOSITION OF SPECIMEN:  PATHOLOGY  COUNTS:  YES  TOURNIQUET:  * No tourniquets in log *  DICTATION: .Dragon Dictation and Note written in EPIC  PLAN OF CARE: Discharge to home after PACU  PATIENT DISPOSITION:  PACU - hemodynamically stable.   Delay start of Pharmacological VTE agent (>24hrs) due to surgical blood loss or risk of bleeding: not applicable

## 2017-08-14 NOTE — Anesthesia Procedure Notes (Signed)
Procedure Name: LMA Insertion Date/Time: 08/14/2017 10:04 AM Performed by: Cleda ClarksBrowder, Trentin Knappenberger R, CRNA Pre-anesthesia Checklist: Patient identified, Emergency Drugs available, Suction available and Patient being monitored Patient Re-evaluated:Patient Re-evaluated prior to induction Oxygen Delivery Method: Circle system utilized Preoxygenation: Pre-oxygenation with 100% oxygen Induction Type: IV induction Ventilation: Mask ventilation without difficulty LMA: LMA inserted LMA Size: 4.0 Tube size: 4.0 mm Number of attempts: 1 Placement Confirmation: positive ETCO2 Tube secured with: Tape Dental Injury: Teeth and Oropharynx as per pre-operative assessment

## 2017-08-15 ENCOUNTER — Encounter (HOSPITAL_COMMUNITY): Payer: Self-pay | Admitting: Obstetrics & Gynecology

## 2017-09-06 LAB — CHROMOSOME STD, POC(TISSUE)-NCBH

## 2018-01-04 ENCOUNTER — Encounter (HOSPITAL_COMMUNITY): Payer: Self-pay

## 2018-01-04 ENCOUNTER — Emergency Department (HOSPITAL_COMMUNITY)
Admission: EM | Admit: 2018-01-04 | Discharge: 2018-01-04 | Disposition: A | Discharge status: Home / Self Care | Payer: BLUE CROSS/BLUE SHIELD | Attending: Emergency Medicine | Admitting: Emergency Medicine

## 2018-01-04 ENCOUNTER — Emergency Department (HOSPITAL_COMMUNITY): Payer: BLUE CROSS/BLUE SHIELD

## 2018-01-04 DIAGNOSIS — Z79899 Other long term (current) drug therapy: Secondary | ICD-10-CM | POA: Insufficient documentation

## 2018-01-04 DIAGNOSIS — Y92480 Sidewalk as the place of occurrence of the external cause: Secondary | ICD-10-CM | POA: Insufficient documentation

## 2018-01-04 DIAGNOSIS — S8992XA Unspecified injury of left lower leg, initial encounter: Secondary | ICD-10-CM | POA: Diagnosis present

## 2018-01-04 DIAGNOSIS — Y9301 Activity, walking, marching and hiking: Secondary | ICD-10-CM | POA: Insufficient documentation

## 2018-01-04 DIAGNOSIS — S80212A Abrasion, left knee, initial encounter: Secondary | ICD-10-CM | POA: Insufficient documentation

## 2018-01-04 DIAGNOSIS — R55 Syncope and collapse: Secondary | ICD-10-CM | POA: Diagnosis not present

## 2018-01-04 DIAGNOSIS — W01198A Fall on same level from slipping, tripping and stumbling with subsequent striking against other object, initial encounter: Secondary | ICD-10-CM | POA: Insufficient documentation

## 2018-01-04 DIAGNOSIS — R51 Headache: Secondary | ICD-10-CM | POA: Diagnosis not present

## 2018-01-04 DIAGNOSIS — Y999 Unspecified external cause status: Secondary | ICD-10-CM | POA: Insufficient documentation

## 2018-01-04 DIAGNOSIS — R519 Headache, unspecified: Secondary | ICD-10-CM

## 2018-01-04 DIAGNOSIS — T148XXA Other injury of unspecified body region, initial encounter: Secondary | ICD-10-CM

## 2018-01-04 LAB — URINALYSIS, ROUTINE W REFLEX MICROSCOPIC
Bacteria, UA: NONE SEEN
Bilirubin Urine: NEGATIVE
GLUCOSE, UA: NEGATIVE mg/dL
HGB URINE DIPSTICK: NEGATIVE
Ketones, ur: NEGATIVE mg/dL
Nitrite: NEGATIVE
PROTEIN: 30 mg/dL — AB
Specific Gravity, Urine: 1.029 (ref 1.005–1.030)
pH: 5 (ref 5.0–8.0)

## 2018-01-04 LAB — CBC
HCT: 41.8 % (ref 36.0–46.0)
HEMOGLOBIN: 13.8 g/dL (ref 12.0–15.0)
MCH: 24.9 pg — ABNORMAL LOW (ref 26.0–34.0)
MCHC: 33 g/dL (ref 30.0–36.0)
MCV: 75.3 fL — ABNORMAL LOW (ref 78.0–100.0)
Platelets: 262 10*3/uL (ref 150–400)
RBC: 5.55 MIL/uL — AB (ref 3.87–5.11)
RDW: 15.5 % (ref 11.5–15.5)
WBC: 8.6 10*3/uL (ref 4.0–10.5)

## 2018-01-04 LAB — CBG MONITORING, ED: Glucose-Capillary: 91 mg/dL (ref 70–99)

## 2018-01-04 LAB — BASIC METABOLIC PANEL
ANION GAP: 8 (ref 5–15)
BUN: 10 mg/dL (ref 6–20)
CALCIUM: 9.1 mg/dL (ref 8.9–10.3)
CO2: 24 mmol/L (ref 22–32)
Chloride: 108 mmol/L (ref 98–111)
Creatinine, Ser: 0.84 mg/dL (ref 0.44–1.00)
GFR calc Af Amer: >60 mL/min (ref >60)
GFR calc non Af Amer: >60 mL/min (ref >60)
Glucose, Bld: 120 mg/dL — ABNORMAL HIGH (ref 70–99)
Potassium: 3.6 mmol/L (ref 3.5–5.1)
Sodium: 140 mmol/L (ref 135–145)

## 2018-01-04 LAB — I-STAT BETA HCG BLOOD, ED (MC, WL, AP ONLY): I-stat hCG, quantitative: <5 m[IU]/mL (ref <5)

## 2018-01-04 MED ORDER — BACITRACIN ZINC 500 UNIT/GM EX OINT
TOPICAL_OINTMENT | Freq: Once | CUTANEOUS | Status: AC
  Administered 2018-01-04: 1 via TOPICAL
  Filled 2018-01-04: qty 0.9

## 2018-01-04 NOTE — ED Notes (Signed)
Cleaned and bandaged wound to left knee.

## 2018-01-04 NOTE — ED Triage Notes (Signed)
Pt presents via EMS. [place], tripped on some pavement and fell forward. Pt has some abrasions to her left knee and small abrasion on her lip, bleeding controlled. After going to see campus security, she had a syncopal episode, positive LOC. Pt is alert and oriented at this time.

## 2018-01-04 NOTE — ED Notes (Signed)
Patient aware we need urine sample. Patient will call out when ready to void.  

## 2018-01-04 NOTE — ED Notes (Signed)
Patient transported to X-ray and CT 

## 2018-01-04 NOTE — ED Provider Notes (Signed)
[facility]-EMERGENCY DEPT Provider Note   CSN: [id number] Arrival date & time: 01/04/18  1502     History   Chief Complaint Chief Complaint  Patient presents with  . Loss of Consciousness    HPI Rose Manning is a 27 y.o. female past medical history of anxiety, depression brought in by EMS for evaluation of fall, syncopal episode and loss of consciousness.  Patient reports that approximately 2 PM this afternoon, she tripped over some uneven pavement causing her to fall forward.  Patient reports that she hit her face and her head at that time.  Patient also reports that she injured her left knee.  Patient reports she did not lose consciousness at that time.  She states that she was able to get up and ambulate and put weight on the extremity.  Patient reports that a Psychologist, clinical came and helped her.  While she was with him, patient had a syncopal episode and lost consciousness and fell to the ground.  Patient states that she felt nauseous but denied any dizziness or chest pain.  Patient reports that when she woke up she knew where she was.  She states that she feels better since coming to the ED.  Patient reports some pain to her chin but feels like her dentition is intact.  Patient also reports pain to the left knee where the abrasion is.  Patient is not on any blood thinners.  Patient reports that all she ate today was some granola in the pickle.  Patient denies any vision changes, neck pain, back pain, chest pain, difficulty breathing, abdominal pain, numbness/weakness of arms or legs, vision changes.  The history is provided by the patient.    Past Medical History:  Diagnosis Date  . Anxiety   . Depression   . Hx of varicella   . Migraine   . Obesity   . Pregnancy induced hypertension   . Thyroid cyst     Patient Active Problem List   Diagnosis Date Noted  . Morbid obesity (HCC) 09/30/2013    Past Surgical History:  Procedure Laterality  Date  . CHOLECYSTECTOMY  01/2009  . DILATION AND EVACUATION Bilateral 08/14/2017   Procedure: Suction DILATATION AND EVACUATION;  Surgeon: Hoover Browns, MD;  Location: [facility] ORS;  Service: Gynecology;  Laterality: Bilateral;  . WISDOM TOOTH EXTRACTION       OB History    Gravida  6   Para  1   Term  1   Preterm      AB  4   Living  1     SAB  3   TAB  1   Ectopic      Multiple      Live Births  1            Home Medications    Prior to Admission medications   Medication Sig Start Date End Date Taking? Authorizing Provider  BLISOVI FE 1.5/30 1.5-30 MG-MCG tablet Take 1 tablet by mouth daily. 12/26/17  Yes [provider]  EPINEPHrine (EPIPEN) 0.3 mg/0.3 mL SOAJ injection Inject 0.3 mLs (0.3 mg total) into the muscle as needed. 05/25/13  Yes Devoria Albe, MD  ibuprofen (ADVIL,MOTRIN) 800 MG tablet Take 1 tablet (800 mg total) by mouth 3 (three) times daily. Patient not taking: Reported on 01/04/2018 08/14/17   Hoover Browns, MD  ibuprofen (CHILD IBUPROFEN) 100 MG/5ML suspension Take 30 mLs (600 mg total) by mouth every 6 (six) hours as needed. Patient  not taking: Reported on 07/23/2017 06/18/16   Arby BarrettePfeiffer, Marcy, MD  oxycodone-acetaminophen (PERCOCET) 2.5-325 MG tablet Take 1 tablet by mouth every 4 (four) hours as needed for pain. Patient not taking: Reported on 01/04/2018 08/14/17   Hoover BrownsKulwa, Ema, MD    Family History Family History  Problem Relation Age of Onset  . Hypertension Mother   . Urolithiasis Mother   . Migraines Mother   . Hypertension Father   . Hypertension Maternal Grandmother   . Hypertension Maternal Grandfather   . Alzheimer's disease Maternal Grandfather   . Hypertension Paternal Grandmother   . Hypertension Paternal Grandfather   . Heart disease Paternal Grandfather   . Lupus Maternal Aunt   . Rheum arthritis Maternal Aunt   . Diabetes Cousin   . Anesthesia problems Neg Hx     Social History Social History   Tobacco Use  . Smoking status:  Never Smoker  . Smokeless tobacco: Never Used  Substance Use Topics  . Alcohol use: No    Comment: occasionally  . Drug use: No     Allergies   Shellfish allergy   Review of Systems Review of Systems  Constitutional: Negative for fever.  HENT:       Facial pain  Eyes: Negative for visual disturbance.  Respiratory: Negative for cough and shortness of breath.   Cardiovascular: Negative for chest pain.  Gastrointestinal: Negative for abdominal pain, nausea and vomiting.  Genitourinary: Negative for dysuria and hematuria.  Skin: Positive for wound.  Neurological: Positive for syncope. Negative for weakness, numbness and headaches.  All other systems reviewed and are negative.    Physical Exam Updated Vital Signs BP (!) 155/90 (BP Location: Left Arm)   Pulse 78   Resp 18   Ht 5\' 5"  (1.651 m)   Wt (!) 144.2 kg   LMP 12/21/2017 (Approximate)   SpO2 95%   Breastfeeding? Unknown   BMI 52.92 kg/m   Physical Exam  Constitutional: She is oriented to person, place, and time. She appears well-developed and well-nourished.  HENT:  Head: Normocephalic and atraumatic.    Mouth/Throat: Oropharynx is clear and moist and mucous membranes are normal.  No tenderness to palpation of skull. No deformities or crepitus noted. No open wounds, abrasions or lacerations.  Tenderness palpation noted to the chin.  No deformity or crepitus noted.  There is some mild overlying soft tissue swelling.  No tenderness to palpation noted to the lateral mandibles.  Elevation/depression lateral movement of the mandible is intact without any difficulty.  Dentition is intact without any abnormalities.  Eyes: Pupils are equal, round, and reactive to light. Conjunctivae, EOM and lids are normal.  Neck: Full passive range of motion without pain.  Full flexion/extension and lateral movement of neck fully intact. No bony midline tenderness. No deformities or crepitus.   Cardiovascular: Normal rate, regular  rhythm, normal heart sounds and normal pulses. Exam reveals no gallop and no friction rub.  No murmur heard. Pulmonary/Chest: Effort normal and breath sounds normal.  Lungs clear to auscultation bilaterally.  Symmetric chest rise.  No wheezing, rales, rhonchi.  Abdominal: Soft. Normal appearance. There is no tenderness. There is no rigidity and no guarding.  Musculoskeletal: Normal range of motion.       Thoracic back: She exhibits no tenderness.       Lumbar back: She exhibits no tenderness.  Tenderness to palpation noted to the anterior aspect of the left knee and proximal tib-fib.  No deformity or crepitus noted.  Limited range  of motion secondary to pain.  No tenderness palpation to the left femur, left distal tib-fib, left ankle.  No abnormalities in the right lower extremity.  Neurological: She is alert and oriented to person, place, and time.  Cranial nerves III-XII intact Follows commands, Moves all extremities  5/5 strength to BUE and BLE  Sensation intact throughout all major nerve distributions Normal finger to nose. No dysdiadochokinesia. No pronator drift. No gait abnormalities  No slurred speech. No facial droop.   Skin: Skin is warm and dry. Capillary refill takes less than 2 seconds.  Large abrasion noted to the anterior aspect of the left knee.  Psychiatric: She has a normal mood and affect. Her speech is normal.  Nursing note and vitals reviewed.    ED Treatments / Results  Labs (all labs ordered are listed, but only abnormal results are displayed) Labs Reviewed  BASIC METABOLIC PANEL - Abnormal; Notable for the following components:      Result Value   Glucose, Bld 120 (*)    All other components within normal limits  CBC - Abnormal; Notable for the following components:   RBC 5.55 (*)    MCV 75.3 (*)    MCH 24.9 (*)    All other components within normal limits  URINALYSIS, ROUTINE W REFLEX MICROSCOPIC - Abnormal; Notable for the following components:    Color, Urine AMBER (*)    Protein, ur 30 (*)    Leukocytes, UA TRACE (*)    All other components within normal limits  CBG MONITORING, ED  I-STAT BETA HCG BLOOD, ED ([facility], [facility], [facility] ONLY)    EKG EKG Interpretation  Date/Time:  Saturday January 04 2018 15:34:42 EDT Ventricular Rate:  77 PR Interval:    QRS Duration: 95 QT Interval:  376 QTC Calculation: 426 R Axis:   -3 Text Interpretation:  Sinus rhythm Borderline T abnormalities, anterior leads Confirmed by Gerhard MunchLockwood, Robert [phone]) on 01/04/2018 3:43:52 PM Also confirmed by Gerhard MunchLockwood, Robert ([id number]), editor Barbette Hairassel, Kerry [phone])  on 01/04/2018 4:37:31 PM   Radiology Ct Head Wo Contrast  Result Date: 01/04/2018 CLINICAL DATA:  27 y/o F; fall with facial injury to upper lip and chin. Loss of consciousness. EXAM: CT HEAD WITHOUT CONTRAST CT MAXILLOFACIAL WITHOUT CONTRAST TECHNIQUE: Multidetector CT imaging of the head and maxillofacial structures were performed using the standard protocol without intravenous contrast. Multiplanar CT image reconstructions of the maxillofacial structures were also generated. COMPARISON:  06/18/2016 CT head and maxillofacial. FINDINGS: CT HEAD FINDINGS Brain: No evidence of acute infarction, hemorrhage, hydrocephalus, extra-axial collection or mass lesion/mass effect. Vascular: No hyperdense vessel or unexpected calcification. Skull: Normal. Negative for fracture or focal lesion. Other: None. CT MAXILLOFACIAL FINDINGS Osseous: No fracture or mandibular dislocation. No destructive process. Orbits: Negative. No traumatic or inflammatory finding. Sinuses: Clear. Soft tissues: Soft tissue swelling overlying the chin. IMPRESSION: 1. Normal CT of the head. 2. No acute facial fracture or mandibular dislocation. 3. Soft tissue swelling overlying the chin compatible with contusion. Electronically Signed   By: Mitzi HansenLance  Furusawa-Stratton M.D.   On: 01/04/2018 17:46   Dg Knee Complete 4 Views Left  Result Date: 01/04/2018 CLINICAL  DATA:  Left knee pain post fall. EXAM: LEFT KNEE - COMPLETE 4+ VIEW COMPARISON:  None. FINDINGS: No evidence of fracture, dislocation, or joint effusion. No evidence of arthropathy or other focal bone abnormality. Soft tissues are unremarkable. IMPRESSION: Negative. Electronically Signed   By: Ted Mcalpineobrinka  Dimitrova M.D.   On: 01/04/2018 16:22   Ct Maxillofacial Wo  Contrast  Result Date: 01/04/2018 CLINICAL DATA:  27 y/o F; fall with facial injury to upper lip and chin. Loss of consciousness. EXAM: CT HEAD WITHOUT CONTRAST CT MAXILLOFACIAL WITHOUT CONTRAST TECHNIQUE: Multidetector CT imaging of the head and maxillofacial structures were performed using the standard protocol without intravenous contrast. Multiplanar CT image reconstructions of the maxillofacial structures were also generated. COMPARISON:  06/18/2016 CT head and maxillofacial. FINDINGS: CT HEAD FINDINGS Brain: No evidence of acute infarction, hemorrhage, hydrocephalus, extra-axial collection or mass lesion/mass effect. Vascular: No hyperdense vessel or unexpected calcification. Skull: Normal. Negative for fracture or focal lesion. Other: None. CT MAXILLOFACIAL FINDINGS Osseous: No fracture or mandibular dislocation. No destructive process. Orbits: Negative. No traumatic or inflammatory finding. Sinuses: Clear. Soft tissues: Soft tissue swelling overlying the chin. IMPRESSION: 1. Normal CT of the head. 2. No acute facial fracture or mandibular dislocation. 3. Soft tissue swelling overlying the chin compatible with contusion. Electronically Signed   By: Mitzi Hansen M.D.   On: 01/04/2018 17:46    Procedures Procedures (including critical care time)  Medications Ordered in ED Medications  bacitracin ointment (1 application Topical Given 01/04/18 1623)     Initial Impression / Assessment and Plan / ED Course  I have reviewed the triage vital signs and the nursing notes.  Pertinent labs & imaging results that were available  during my care of the patient were reviewed by me and considered in my medical decision making (see chart for details).     27 year old female who presents for evaluation of after mechanical fall, syncopal episode.  Patient had a mechanical fall after she tripped on some pavement.  No initial LOC.  Was seen in by campus security and had a syncopal episode with positive LOC.  Patient reports some nauseous but no dizziness, chest pain prior to onset of syncopal episode. Patient is afebrile, non-toxic appearing, sitting comfortably on examination table. Vital signs reviewed and stable.  On exam, patient is alert and oriented.  No neuro deficits noted.  She does have some tenderness noted to the chin but no deformity or crepitus noted.  Also with tenderness to the left knee.  Plan to check basic labs, EKG, x-ray evaluation of knee.  Consider dehydration versus orthostatic hypotension.   UA shows trace leukocytes.  Otherwise unremarkable.  CBC without any significant leukocytosis or anemia.  I-STAT beta negative.  BNP unremarkable.  Knee x-ray negative for any acute bony abnormality.  CT negative for any acute bony abnormalities.  CT maxillofacial shows no acute fracture.  Discussed results with patient.  Patient is asymptomatic at this time.  Vital signs are stable.  Patient stable for discharge at this time.  Suspect the single episode was likely due to stress after fall.  Encourage at home supportive therapies.  Instructed patient to follow-up with primary care doctor in the next 2 to 4 days for further evaluation. Patient had ample opportunity for questions and discussion. All patient's questions were answered with full understanding. Strict return precautions discussed. Patient expresses understanding and agreement to plan.   Final Clinical Impressions(s) / ED Diagnoses   Final diagnoses:  Abrasion  Facial pain  Syncope, unspecified syncope type    ED Discharge Orders    None       Rosana Hoes 01/04/18 1854    Gerhard Munch, MD 01/05/18 0003

## 2018-01-04 NOTE — Discharge Instructions (Signed)
You can take Tylenol or Ibuprofen as directed for pain. You can alternate Tylenol and Ibuprofen every 4 hours. If you take Tylenol at 1pm, then you can take Ibuprofen at 5pm. Then you can take Tylenol again at 9pm.   Keep the wound clean and dry. You may gently clean the wound with soap and water. Make sure to pat dry the wound before covering it with any dressing. You can use topical antibiotic ointment and bandage. Ice and elevate for pain relief.   Follow-up with your primary care doctor in the next 2-4 days.   Return to the Emergency Department for any chest pain, difficulty breathing, vision changes, numbness/weakness of her arms or legs, vomiting or any worsening or concerning symptoms.

## 2018-04-03 ENCOUNTER — Emergency Department (HOSPITAL_BASED_OUTPATIENT_CLINIC_OR_DEPARTMENT_OTHER)
Admission: EM | Admit: 2018-04-03 | Discharge: 2018-04-03 | Disposition: A | Discharge status: Home / Self Care | Payer: BLUE CROSS/BLUE SHIELD | Attending: Emergency Medicine | Admitting: Emergency Medicine

## 2018-04-03 ENCOUNTER — Other Ambulatory Visit: Payer: Self-pay

## 2018-04-03 ENCOUNTER — Encounter (HOSPITAL_BASED_OUTPATIENT_CLINIC_OR_DEPARTMENT_OTHER): Payer: Self-pay | Admitting: *Deleted

## 2018-04-03 DIAGNOSIS — R21 Rash and other nonspecific skin eruption: Secondary | ICD-10-CM | POA: Diagnosis present

## 2018-04-03 DIAGNOSIS — Z3202 Encounter for pregnancy test, result negative: Secondary | ICD-10-CM | POA: Diagnosis not present

## 2018-04-03 DIAGNOSIS — L509 Urticaria, unspecified: Secondary | ICD-10-CM | POA: Insufficient documentation

## 2018-04-03 DIAGNOSIS — Z79899 Other long term (current) drug therapy: Secondary | ICD-10-CM | POA: Insufficient documentation

## 2018-04-03 DIAGNOSIS — T7840XA Allergy, unspecified, initial encounter: Secondary | ICD-10-CM | POA: Insufficient documentation

## 2018-04-03 LAB — PREGNANCY, URINE: Preg Test, Ur: NEGATIVE

## 2018-04-03 MED ORDER — METHYLPREDNISOLONE SODIUM SUCC 40 MG IJ SOLR
20.0000 mg | Freq: Once | INTRAMUSCULAR | Status: AC
  Administered 2018-04-03: 20 mg via INTRAVENOUS
  Filled 2018-04-03: qty 1

## 2018-04-03 MED ORDER — FAMOTIDINE 20 MG PO TABS: 20.0000 mg | ORAL_TABLET | Freq: Two times a day (BID) | ORAL | 0 refills | Status: DC

## 2018-04-03 MED ORDER — FAMOTIDINE IN NACL 20-0.9 MG/50ML-% IV SOLN
20.0000 mg | Freq: Once | INTRAVENOUS | Status: AC
Start: 2018-04-03 — End: 2018-04-03
  Administered 2018-04-03: 20 mg via INTRAVENOUS
  Filled 2018-04-03: qty 50

## 2018-04-03 MED ORDER — DIPHENHYDRAMINE HCL 25 MG PO TABS: 25.0000 mg | ORAL_TABLET | Freq: Four times a day (QID) | ORAL | 0 refills | Status: DC

## 2018-04-03 MED ORDER — SODIUM CHLORIDE 0.9 % IV BOLUS
1000.0000 mL | Freq: Once | INTRAVENOUS | Status: AC
  Administered 2018-04-03: 1000 mL via INTRAVENOUS

## 2018-04-03 MED ORDER — DIPHENHYDRAMINE HCL 50 MG/ML IJ SOLN
25.0000 mg | Freq: Once | INTRAMUSCULAR | Status: AC
  Administered 2018-04-03: 25 mg via INTRAVENOUS
  Filled 2018-04-03: qty 1

## 2018-04-03 MED ORDER — SODIUM CHLORIDE 0.9 % IV SOLN
INTRAVENOUS | Status: DC | PRN
  Administered 2018-04-03: 500 mL via INTRAVENOUS

## 2018-04-03 NOTE — ED Provider Notes (Signed)
[facility] EMERGENCY DEPARTMENT Provider Note   CSN: [id number] Arrival date & time: 04/03/18  1709     History   Chief Complaint Chief Complaint  Patient presents with  . Allergic Reaction    HPI Rose Manning is a 27 y.o. female some concern anxiety, depression, migraine, obesity who presents for evaluation of hives that began last night.  She reports that last night, she was putting up an artificial Christmas tree which she states she has used before.  She reports shortly after, she broke out in hives to her bilateral upper extremities, bilateral lower extremities, chest, back, abdomen.  She states that the rash is pruritic but not painful.  She went to work today and stated that the rash kept itching so she went to primary care doctor.  At primary care doctor, they gave her some topical Benadryl cream as well as 80 mg of Solu-Medrol.  Patient reports she went home of the hives continue to occur, prompting ED visit.  She denies any lip or tongue swelling, difficulty breathing, vomiting.  She has not had any new exposures, lotions, soaps, detergents.  She does not know of any insect bites.  She has not started any new medications.  The history is provided by the patient.    Past Medical History:  Diagnosis Date  . Anxiety   . Depression   . Hx of varicella   . Migraine   . Obesity   . Pregnancy induced hypertension   . Thyroid cyst     Patient Active Problem List   Diagnosis Date Noted  . Morbid obesity (HCC) 09/30/2013    Past Surgical History:  Procedure Laterality Date  . CHOLECYSTECTOMY  01/2009  . DILATION AND EVACUATION Bilateral 08/14/2017   Procedure: Suction DILATATION AND EVACUATION;  Surgeon: Hoover Browns, MD;  Location: [facility];  Service: Gynecology;  Laterality: Bilateral;  . WISDOM TOOTH EXTRACTION       OB History    Gravida  6   Para  1   Term  1   Preterm      AB  4   Living  1     SAB  3   TAB  1   Ectopic      Multiple       Live Births  1            Home Medications    Prior to Admission medications   Medication Sig Start Date End Date Taking? Authorizing Provider  EPINEPHrine (EPIPEN) 0.3 mg/0.3 mL SOAJ injection Inject 0.3 mLs (0.3 mg total) into the muscle as needed. 05/25/13  Yes Devoria Albe, MD  ibuprofen (ADVIL,MOTRIN) 800 MG tablet Take 1 tablet (800 mg total) by mouth 3 (three) times daily. 08/14/17  Yes Hoover Browns, MD  BLISOVI FE 1.5/30 1.5-30 MG-MCG tablet Take 1 tablet by mouth daily. 12/26/17   [provider]  diphenhydrAMINE (BENADRYL) 25 MG tablet Take 1 tablet (25 mg total) by mouth every 6 (six) hours for 4 days. 04/03/18 04/07/18  Maxwell Caul, PA-C  famotidine (PEPCID) 20 MG tablet Take 1 tablet (20 mg total) by mouth 2 (two) times daily for 4 days. 04/03/18 04/07/18  Graciella Freer A, PA-C  ibuprofen (CHILD IBUPROFEN) 100 MG/5ML suspension Take 30 mLs (600 mg total) by mouth every 6 (six) hours as needed. Patient not taking: Reported on 07/23/2017 06/18/16   Arby Barrette, MD  oxycodone-acetaminophen (PERCOCET) 2.5-325 MG tablet Take 1 tablet by mouth every 4 (four)  hours as needed for pain. Patient not taking: Reported on 01/04/2018 08/14/17   Hoover BrownsKulwa, Ema, MD    Family History Family History  Problem Relation Age of Onset  . Hypertension Mother   . Urolithiasis Mother   . Migraines Mother   . Hypertension Father   . Hypertension Maternal Grandmother   . Hypertension Maternal Grandfather   . Alzheimer's disease Maternal Grandfather   . Hypertension Paternal Grandmother   . Hypertension Paternal Grandfather   . Heart disease Paternal Grandfather   . Lupus Maternal Aunt   . Rheum arthritis Maternal Aunt   . Diabetes Cousin   . Anesthesia problems Neg Hx     Social History Social History   Tobacco Use  . Smoking status: Never Smoker  . Smokeless tobacco: Never Used  Substance Use Topics  . Alcohol use: No    Comment: occasionally  . Drug use: No      Allergies   Shellfish allergy   Review of Systems Review of Systems  Constitutional: Negative for fever.  HENT: Negative for facial swelling.   Respiratory: Negative for shortness of breath.   Cardiovascular: Negative for chest pain.  Gastrointestinal: Negative for nausea and vomiting.  Skin: Positive for rash.  All other systems reviewed and are negative.    Physical Exam Updated Vital Signs BP 137/73   Pulse 84   Temp 98.1 F (36.7 C)   Resp 16   LMP 03/16/2018   SpO2 100%   Physical Exam  Constitutional: She is oriented to person, place, and time. She appears well-developed and well-nourished.  HENT:  Head: Normocephalic and atraumatic.  Mouth/Throat: Oropharynx is clear and moist and mucous membranes are normal.  Airways patent, phonation is intact.  No oral angioedema.  Eyes: Pupils are equal, round, and reactive to light. Conjunctivae, EOM and lids are normal.  Neck: Full passive range of motion without pain.  Cardiovascular: Normal rate, regular rhythm, normal heart sounds and normal pulses. Exam reveals no gallop and no friction rub.  No murmur heard. Pulmonary/Chest: Effort normal and breath sounds normal.  Lungs clear to auscultation bilaterally.  Symmetric chest rise.  No wheezing, rales, rhonchi.  Able to speak in full sentences without any difficulty.  Abdominal: Soft. Normal appearance. There is no tenderness. There is no rigidity and no guarding.  Musculoskeletal: Normal range of motion.  Neurological: She is alert and oriented to person, place, and time.  Skin: Skin is warm and dry. Capillary refill takes less than 2 seconds. Rash noted. Rash is urticarial.  Diffuse urticarial rash noted to face, bilateral upper extremities, bilateral lower extremities, chest, abdomen, back.  Bilateral hands have urticaria noted to the dorsal aspect.  Pulmonary aspect is without rash but there is some erythema.  No rash noted on soles of feet.  Psychiatric: She has a  normal mood and affect. Her speech is normal.  Nursing note and vitals reviewed.    ED Treatments / Results  Labs (all labs ordered are listed, but only abnormal results are displayed) Labs Reviewed  PREGNANCY, URINE    EKG None  Radiology No results found.  Procedures Procedures (including critical care time)  Medications Ordered in ED Medications  sodium chloride 0.9 % bolus 1,000 mL ( Intravenous Stopped 04/03/18 1915)  famotidine (PEPCID) IVPB 20 mg premix ( Intravenous Stopped 04/03/18 1846)  diphenhydrAMINE (BENADRYL) injection 25 mg (25 mg Intravenous Given 04/03/18 1807)  methylPREDNISolone sodium succinate (SOLU-MEDROL) 40 mg/mL injection 20 mg (20 mg Intravenous Given 04/03/18 1807)  Initial Impression / Assessment and Plan / ED Course  I have reviewed the triage vital signs and the nursing notes.  Pertinent labs & imaging results that were available during my care of the patient were reviewed by me and considered in my medical decision making (see chart for details).     27 year old female who presents for evaluation of generalized urticaria that began last night after setting up an artificial Christmas tree which she has before.  No new exposures, foods.  Went to doctor today and was given a shot of steroids but states hives returned.  No difficulty breathing, swelling of her lips or tongue.  Patient reports she has a history of shellfish allergy but has not had any exposure to shellfish. Patient is afebrile, non-toxic appearing, sitting comfortably on examination table. Vital signs reviewed and stable.  On exam, no evidence of oral angioedema.  No evidence of respiratory distress.  She has diffuse urticaria noted to face, chest, abdomen, back, bilateral upper and lower extremities. Consider allergic reaction.  History/physical exam is not concerning for SJS, TENS.  Plan for Benadryl, Pepcid, additional steroids here in the ED  Patient.  Patient reports she  still pruritic but states that the rash is improved.  Reevaluation should rash has significantly decreased on bilateral upper and lower extremities.  She still has some degree of urticaria noted on face and upper body but it has improved significantly.  Patient has been tolerating p.o. without any difficulty.  She states she feels better and is ready to go home.  We will plan for Benadryl, Pepcid for the next 2 days for symptomatic relief. Patient had ample opportunity for questions and discussion. All patient's questions were answered with full understanding. Strict return precautions discussed. Patient expresses understanding and agreement to plan.   Final Clinical Impressions(s) / ED Diagnoses   Final diagnoses:  Allergic reaction, initial encounter    ED Discharge Orders         Ordered    famotidine (PEPCID) 20 MG tablet  2 times daily     04/03/18 2022    diphenhydrAMINE (BENADRYL) 25 MG tablet  Every 6 hours     04/03/18 2022           Maxwell Caul, PA-C 04/04/18 0111    Alvira Monday, MD 04/04/18 1157

## 2018-04-03 NOTE — ED Triage Notes (Signed)
Hives since last night. She has been taking Benadryl. She was seen by her MD this afternoon and given a steroid injection. States she got worse after the injection.

## 2018-04-03 NOTE — Discharge Instructions (Signed)
Take prednisone as directed.  Take Benadryl and Pepcid as directed.  Make sure you are drinking plenty of fluids and staying hydrated.  Return to the emergency department for any worsening rash, swelling of your lips or tongue, difficulty breathing, vomiting, difficulty swallowing your saliva or any other worsening or concerning symptoms.

## 2018-04-10 IMAGING — US US OB TRANSVAGINAL
1 series · 15 of 21 positions shown · non-contrast
Comparison: Ultrasound dated 07/23/2017

CLINICAL DATA: 27-year-old female with vaginal bleeding. LMP:
03/10/2018 corresponding to an estimated gestational age of 10
weeks, 0 days.

EXAM:
TRANSVAGINAL OB ULTRASOUND
TECHNIQUE: Transvaginal ultrasound was performed for complete evaluation of the
gestation as well as the maternal uterus, adnexal regions, and
pelvic cul-de-sac.

[Series 1: us ob transvaginal · 21 acquisitions, 15 frames shown]
[im 1/21]
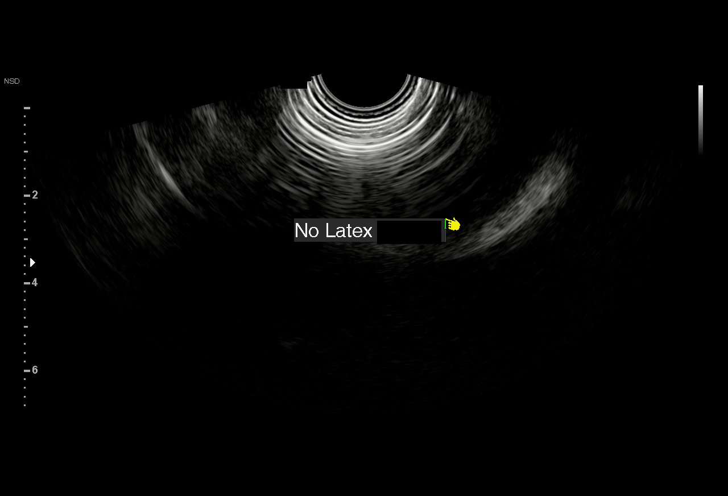
[im 3/21]
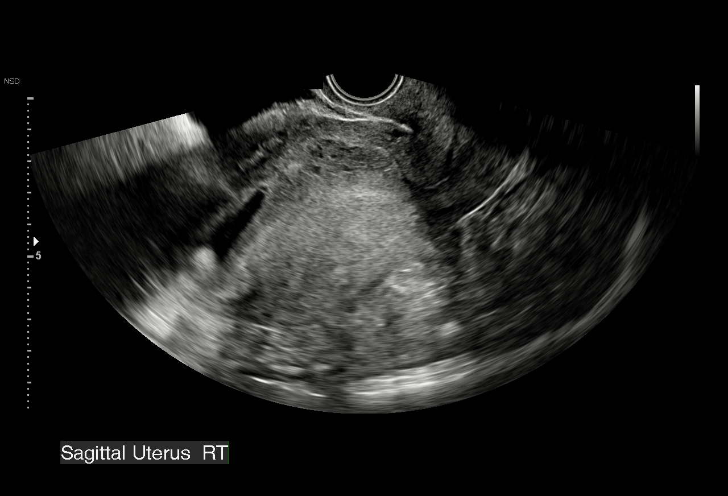
[im 4/21]
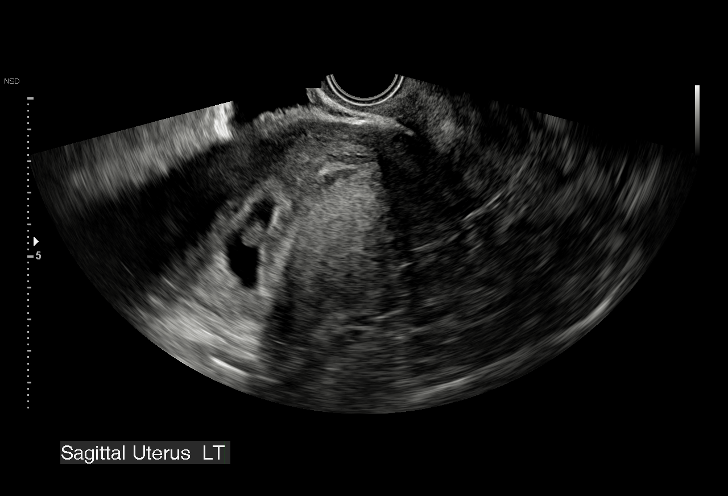
[im 5/21]
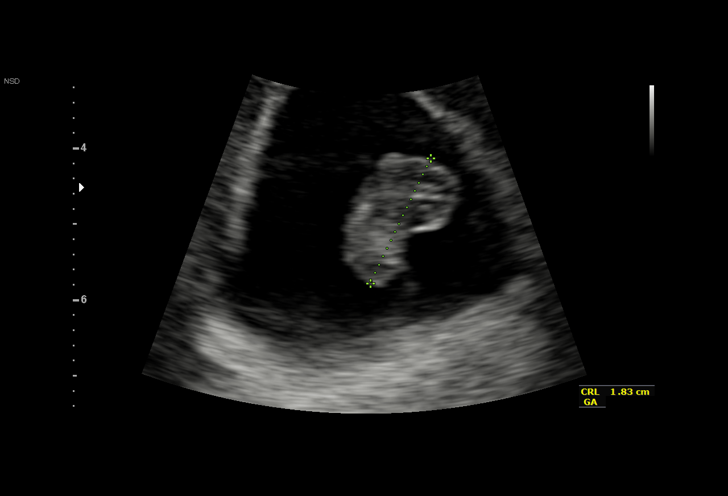
[im 7/21]
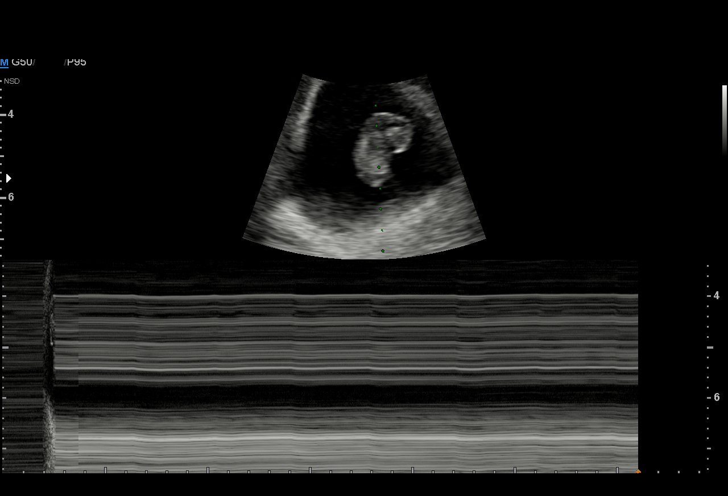
[im 8/21]
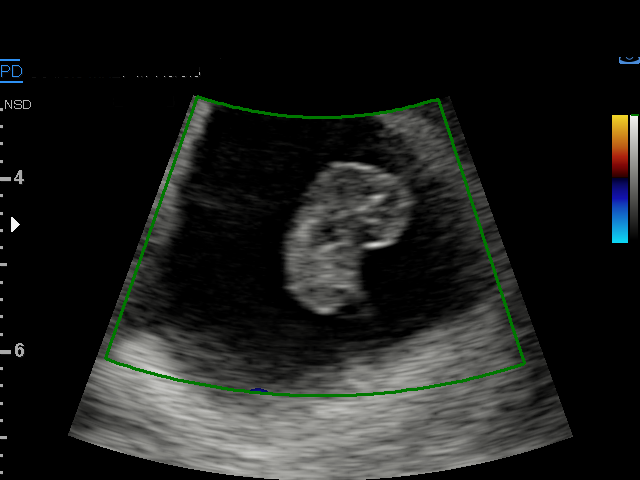
[im 10/21]
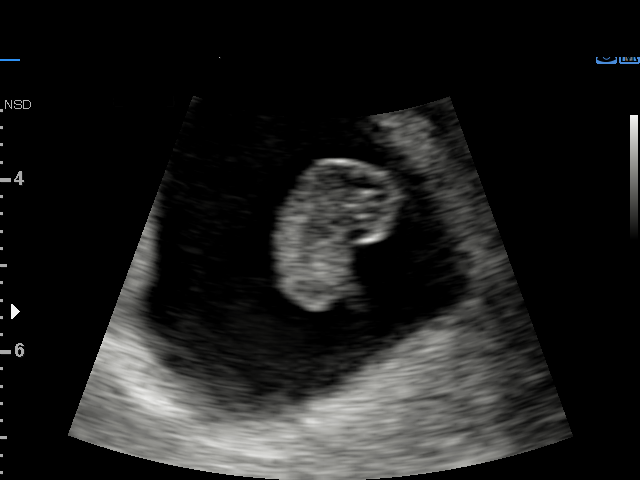
[im 11/21]
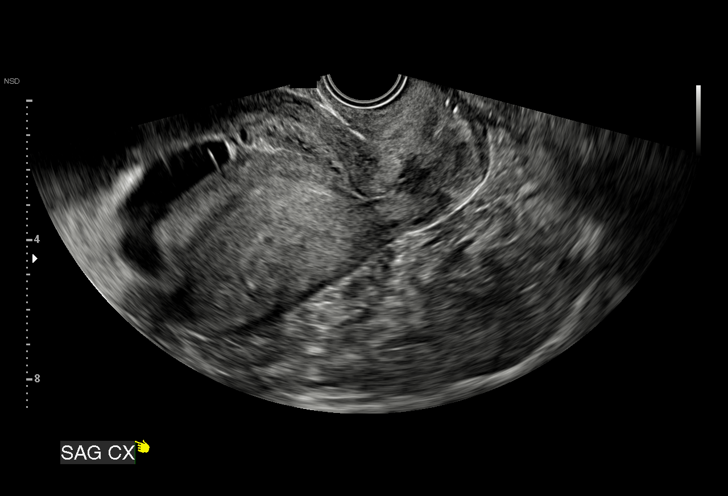
[im 12/21]
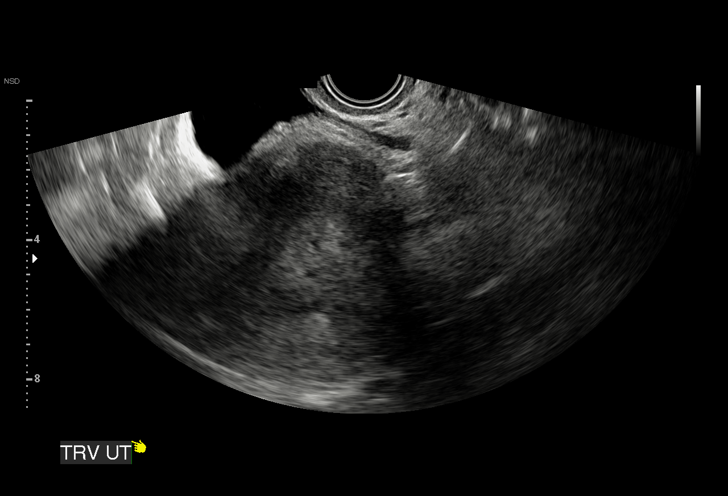
[im 14/21]
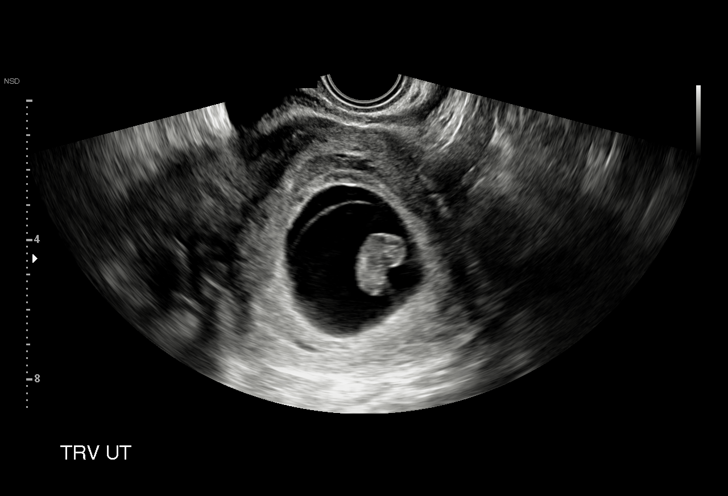
[im 15/21]
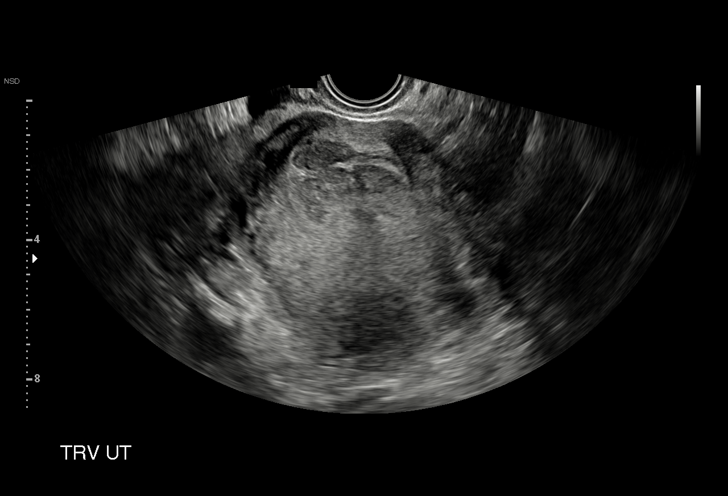
[im 17/21]
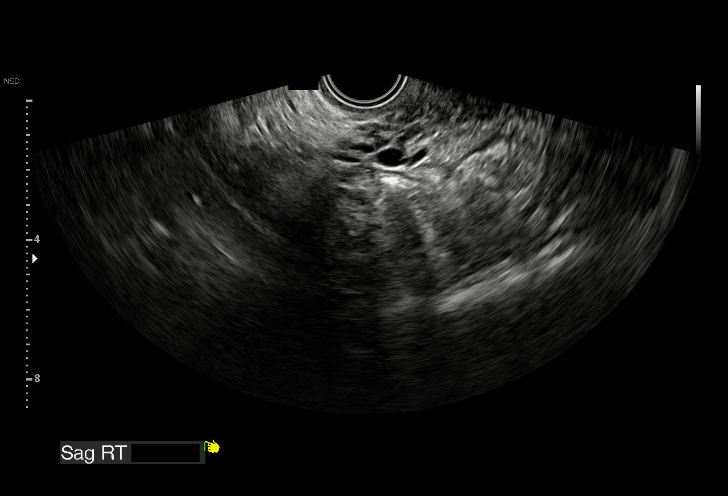
[im 18/21]
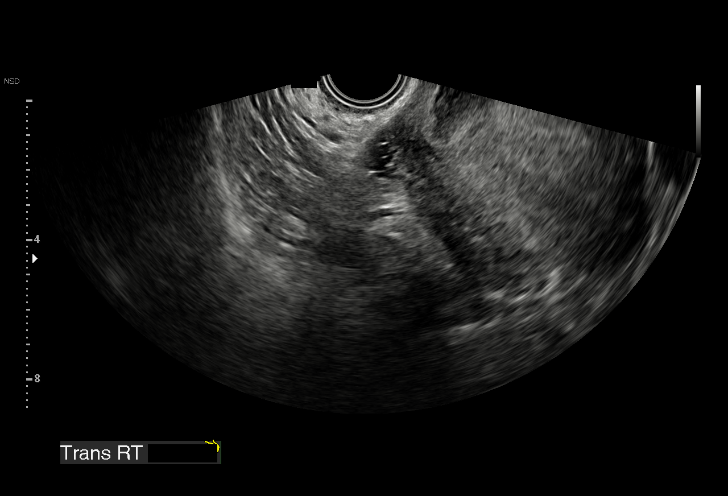
[im 19/21]
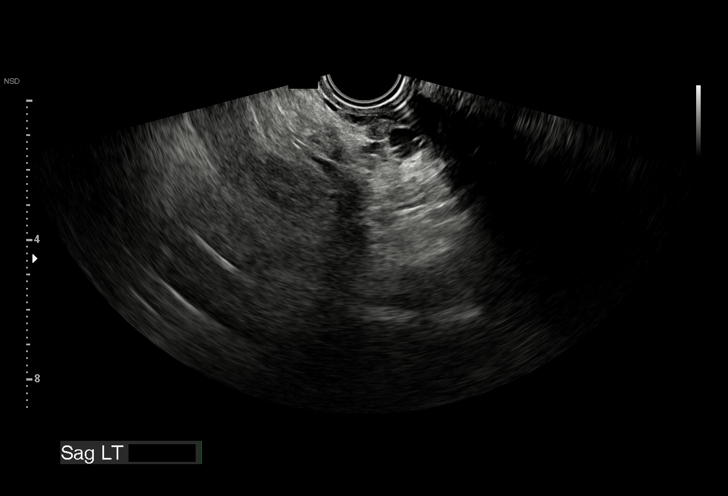
[im 21/21]
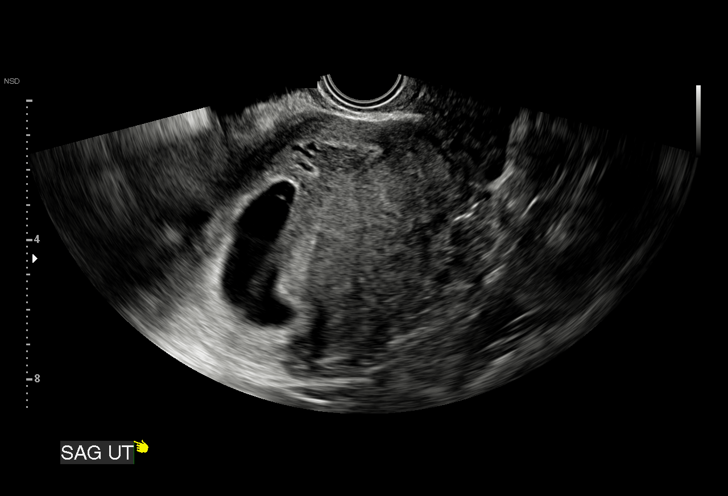

[15 of 21 positions shown; findings below may reference images not displayed]

FINDINGS: Intrauterine gestational sac: Single intrauterine gestational sac.

Yolk sac:  Not seen

Embryo:  Present

Cardiac Activity: Not detected

CRL:   18 mm   8 w 2 d                  US EDC: 03/22/2018

Subchorionic hemorrhage:  None visualized.

Maternal uterus/adnexae: The maternal ovaries are not visualized. No
free fluid within the pelvis.
IMPRESSION: Single intrauterine pregnancy with an estimated gestational age of 8
weeks, 2 days. No fetal cardiac activity identified consistent with
embryonic demise. Clinical correlation recommended.

These results were called by telephone at the time of interpretation
on 08/12/2017 at [DATE] to nurse [REDACTED] JULLON LIENAD , who
verbally acknowledged these results.

## 2019-05-11 ENCOUNTER — Encounter (HOSPITAL_COMMUNITY): Payer: Self-pay | Admitting: Obstetrics and Gynecology

## 2019-05-11 ENCOUNTER — Inpatient Hospital Stay (HOSPITAL_COMMUNITY): Payer: BC Managed Care – PPO

## 2019-05-11 ENCOUNTER — Other Ambulatory Visit: Payer: Self-pay

## 2019-05-11 ENCOUNTER — Inpatient Hospital Stay (HOSPITAL_COMMUNITY)
Admission: AD | Admit: 2019-05-11 | Discharge: 2019-05-11 | Disposition: A | Discharge status: Home / Self Care | Payer: BC Managed Care – PPO | Attending: Obstetrics and Gynecology | Admitting: Obstetrics and Gynecology

## 2019-05-11 DIAGNOSIS — Z91013 Allergy to seafood: Secondary | ICD-10-CM | POA: Diagnosis not present

## 2019-05-11 DIAGNOSIS — Z833 Family history of diabetes mellitus: Secondary | ICD-10-CM | POA: Insufficient documentation

## 2019-05-11 DIAGNOSIS — Z9049 Acquired absence of other specified parts of digestive tract: Secondary | ICD-10-CM | POA: Insufficient documentation

## 2019-05-11 DIAGNOSIS — O99891 Other specified diseases and conditions complicating pregnancy: Secondary | ICD-10-CM | POA: Insufficient documentation

## 2019-05-11 DIAGNOSIS — O26891 Other specified pregnancy related conditions, first trimester: Secondary | ICD-10-CM | POA: Diagnosis not present

## 2019-05-11 DIAGNOSIS — O208 Other hemorrhage in early pregnancy: Secondary | ICD-10-CM | POA: Diagnosis present

## 2019-05-11 DIAGNOSIS — Z3A01 Less than 8 weeks gestation of pregnancy: Secondary | ICD-10-CM

## 2019-05-11 DIAGNOSIS — R1032 Left lower quadrant pain: Secondary | ICD-10-CM | POA: Insufficient documentation

## 2019-05-11 DIAGNOSIS — Z791 Long term (current) use of non-steroidal anti-inflammatories (NSAID): Secondary | ICD-10-CM | POA: Insufficient documentation

## 2019-05-11 DIAGNOSIS — Z79899 Other long term (current) drug therapy: Secondary | ICD-10-CM | POA: Diagnosis not present

## 2019-05-11 DIAGNOSIS — R109 Unspecified abdominal pain: Secondary | ICD-10-CM

## 2019-05-11 DIAGNOSIS — E669 Obesity, unspecified: Secondary | ICD-10-CM | POA: Insufficient documentation

## 2019-05-11 DIAGNOSIS — R030 Elevated blood-pressure reading, without diagnosis of hypertension: Secondary | ICD-10-CM

## 2019-05-11 DIAGNOSIS — Z3490 Encounter for supervision of normal pregnancy, unspecified, unspecified trimester: Secondary | ICD-10-CM

## 2019-05-11 DIAGNOSIS — O418X10 Other specified disorders of amniotic fluid and membranes, first trimester, not applicable or unspecified: Secondary | ICD-10-CM

## 2019-05-11 DIAGNOSIS — O99211 Obesity complicating pregnancy, first trimester: Secondary | ICD-10-CM | POA: Diagnosis not present

## 2019-05-11 DIAGNOSIS — O468X1 Other antepartum hemorrhage, first trimester: Secondary | ICD-10-CM

## 2019-05-11 LAB — URINALYSIS, ROUTINE W REFLEX MICROSCOPIC
Bilirubin Urine: NEGATIVE
Glucose, UA: NEGATIVE mg/dL
Hgb urine dipstick: NEGATIVE
Ketones, ur: NEGATIVE mg/dL
Nitrite: NEGATIVE
Protein, ur: NEGATIVE mg/dL
Specific Gravity, Urine: 1.015 (ref 1.005–1.030)
Squamous Epithelial / LPF: >50 — ABNORMAL HIGH (ref 0–5)
pH: 7 (ref 5.0–8.0)

## 2019-05-11 LAB — WET PREP, GENITAL
Clue Cells Wet Prep HPF POC: NONE SEEN
Sperm: NONE SEEN
Trich, Wet Prep: NONE SEEN
Yeast Wet Prep HPF POC: NONE SEEN

## 2019-05-11 LAB — HCG, QUANTITATIVE, PREGNANCY: hCG, Beta Chain, Quant, S: 8967 m[IU]/mL — ABNORMAL HIGH (ref <5)

## 2019-05-11 LAB — POCT PREGNANCY, URINE: Preg Test, Ur: POSITIVE — AB

## 2019-05-11 LAB — CBC
HCT: 37.2 % (ref 36.0–46.0)
Hemoglobin: 11.8 g/dL — ABNORMAL LOW (ref 12.0–15.0)
MCH: 24.7 pg — ABNORMAL LOW (ref 26.0–34.0)
MCHC: 31.7 g/dL (ref 30.0–36.0)
MCV: 77.8 fL — ABNORMAL LOW (ref 80.0–100.0)
Platelets: 234 10*3/uL (ref 150–400)
RBC: 4.78 MIL/uL (ref 3.87–5.11)
RDW: 15.5 % (ref 11.5–15.5)
WBC: 7.6 10*3/uL (ref 4.0–10.5)
nRBC: 0 % (ref 0.0–0.2)

## 2019-05-11 MED ORDER — ACETAMINOPHEN 500 MG PO TABS
1000.0000 mg | ORAL_TABLET | Freq: Once | ORAL | Status: AC
  Administered 2019-05-11: 1000 mg via ORAL
  Filled 2019-05-11: qty 2

## 2019-05-11 NOTE — Discharge Instructions (Signed)
First Trimester of Pregnancy ° °The first trimester of pregnancy is from week 1 until the end of week 13 (months 1 through 3). During this time, your baby will begin to develop inside you. At 6-8 weeks, the eyes and face are formed, and the heartbeat can be seen on ultrasound. At the end of 12 weeks, all the baby's organs are formed. Prenatal care is all the medical care you receive before the birth of your baby. Make sure you get good prenatal care and follow all of your doctor's instructions. °Follow these instructions at home: °Medicines °· Take over-the-counter and prescription medicines only as told by your doctor. Some medicines are safe and some medicines are not safe during pregnancy. °· Take a prenatal vitamin that contains at least 600 micrograms (mcg) of folic acid. °· If you have trouble pooping (constipation), take medicine that will make your stool soft (stool softener) if your doctor approves. °Eating and drinking ° °· Eat regular, healthy meals. °· Your doctor will tell you the amount of weight gain that is right for you. °· Avoid raw meat and uncooked cheese. °· If you feel sick to your stomach (nauseous) or throw up (vomit): °? Eat 4 or 5 small meals a day instead of 3 large meals. °? Try eating a few soda crackers. °? Drink liquids between meals instead of during meals. °· To prevent constipation: °? Eat foods that are high in fiber, like fresh fruits and vegetables, whole grains, and beans. °? Drink enough fluids to keep your pee (urine) clear or pale yellow. °Activity °· Exercise only as told by your doctor. Stop exercising if you have cramps or pain in your lower belly (abdomen) or low back. °· Do not exercise if it is too hot, too humid, or if you are in a place of great height (high altitude). °· Try to avoid standing for long periods of time. Move your legs often if you must stand in one place for a long time. °· Avoid heavy lifting. °· Wear low-heeled shoes. Sit and stand up  straight. °· You can have sex unless your doctor tells you not to. °Relieving pain and discomfort °· Wear a good support bra if your breasts are sore. °· Take warm water baths (sitz baths) to soothe pain or discomfort caused by hemorrhoids. Use hemorrhoid cream if your doctor says it is okay. °· Rest with your legs raised if you have leg cramps or low back pain. °· If you have puffy, bulging veins (varicose veins) in your legs: °? Wear support hose or compression stockings as told by your doctor. °? Raise (elevate) your feet for 15 minutes, 3-4 times a day. °? Limit salt in your food. °Prenatal care °· Schedule your prenatal visits by the twelfth week of pregnancy. °· Write down your questions. Take them to your prenatal visits. °· Keep all your prenatal visits as told by your doctor. This is important. °Safety °· Wear your seat belt at all times when driving. °· Make a list of emergency phone numbers. The list should include numbers for family, friends, the hospital, and police and fire departments. °General instructions °· Ask your doctor for a referral to a local prenatal class. Begin classes no later than at the start of month 6 of your pregnancy. °· Ask for help if you need counseling or if you need help with nutrition. Your doctor can give you advice or tell you where to go for help. °· Do not use hot tubs, steam   rooms, or saunas. °· Do not douche or use tampons or scented sanitary pads. °· Do not cross your legs for long periods of time. °· Avoid all herbs and alcohol. Avoid drugs that are not approved by your doctor. °· Do not use any tobacco products, including cigarettes, chewing tobacco, and electronic cigarettes. If you need help quitting, ask your doctor. You may get counseling or other support to help you quit. °· Avoid cat litter boxes and soil used by cats. These carry germs that can cause birth defects in the baby and can cause a loss of your baby (miscarriage) or stillbirth. °· Visit your dentist.  At home, brush your teeth with a soft toothbrush. Be gentle when you floss. °Contact a doctor if: °· You are dizzy. °· You have mild cramps or pressure in your lower belly. °· You have a nagging pain in your belly area. °· You continue to feel sick to your stomach, you throw up, or you have watery poop (diarrhea). °· You have a bad smelling fluid coming from your vagina. °· You have pain when you pee (urinate). °· You have increased puffiness (swelling) in your face, hands, legs, or ankles. °Get help right away if: °· You have a fever. °· You are leaking fluid from your vagina. °· You have spotting or bleeding from your vagina. °· You have very bad belly cramping or pain. °· You gain or lose weight rapidly. °· You throw up blood. It may look like coffee grounds. °· You are around people who have German measles, fifth disease, or chickenpox. °· You have a very bad headache. °· You have shortness of breath. °· You have any kind of trauma, such as from a fall or a car accident. °Summary °· The first trimester of pregnancy is from week 1 until the end of week 13 (months 1 through 3). °· To take care of yourself and your unborn baby, you will need to eat healthy meals, take medicines only if your doctor tells you to do so, and do activities that are safe for you and your baby. °· Keep all follow-up visits as told by your doctor. This is important as your doctor will have to ensure that your baby is healthy and growing well. °This information is not intended to replace advice given to you by your health care provider. Make sure you discuss any questions you have with your health care provider. °Document Released: 10/24/2007 Document Revised: 08/28/2018 Document Reviewed: 05/15/2016 °[employer]vier Patient Education © 2020 [employer] ° °

## 2019-05-11 NOTE — MAU Note (Signed)
Having pain in LLQ, started yesterday.  Comes and goes/ crampy. No bleeding.  is preg, has not been seen anywhere.

## 2019-05-11 NOTE — MAU Provider Note (Signed)
History     CSN: [id number]  Arrival date and time: 05/11/19 1010   First Provider Initiated Contact with Patient 05/11/19 1050     Chief Complaint  Patient presents with  . Abdominal Pain  . Possible Pregnancy   HPI Rose Manning is a 28 y.o. [phone] at [redacted]w[redacted]d by uncertain LMP who presents to MAU with chief complaint of left lower quadrant pain. This is a new problem, onset onset yesterday. Patient rates her pain as 3/10, non-radiating, and states she experiences a small amount of relief when she "curls up in a ball". She has not taken medication, declines offer of Tylenol upon initial assessment in MAU.   Patient endorses history of elevated blood pressures "only when I'm pregnant". She denies diagnosis of chronic hypertension and states she has never been on blood pressure medication.   She denies vaginal bleeding, abnormal vaginal discharge, dysuria, fever, falls, or recent illness.   OB History    Gravida  6   Para  1   Term  1   Preterm      AB  4   Living  1     SAB  3   TAB  1   Ectopic      Multiple      Live Births  1           Past Medical History:  Diagnosis Date  . Anxiety   . Depression   . Hx of varicella   . Migraine   . Obesity   . Pregnancy induced hypertension   . Thyroid cyst     Past Surgical History:  Procedure Laterality Date  . CHOLECYSTECTOMY  01/2009  . DILATION AND EVACUATION Bilateral 08/14/2017   Procedure: Suction DILATATION AND EVACUATION;  Surgeon: Hoover Browns, MD;  Location: [facility];  Service: Gynecology;  Laterality: Bilateral;  . WISDOM TOOTH EXTRACTION      Family History  Problem Relation Age of Onset  . Hypertension Mother   . Urolithiasis Mother   . Migraines Mother   . Hypertension Father   . Hypertension Maternal Grandmother   . Hypertension Maternal Grandfather   . Alzheimer's disease Maternal Grandfather   . Hypertension Paternal Grandmother   . Hypertension Paternal Grandfather   . Heart disease  Paternal Grandfather   . Lupus Maternal Aunt   . Rheum arthritis Maternal Aunt   . Diabetes Cousin   . Anesthesia problems Neg Hx     Social History   Tobacco Use  . Smoking status: Never Smoker  . Smokeless tobacco: Never Used  Substance Use Topics  . Alcohol use: No    Comment: occasionally  . Drug use: No    Allergies:  Allergies  Allergen Reactions  . Shellfish Allergy Hives    Medications Prior to Admission  Medication Sig Dispense Refill Last Dose  . BLISOVI FE 1.5/30 1.5-30 MG-MCG tablet Take 1 tablet by mouth daily.  1   . diphenhydrAMINE (BENADRYL) 25 MG tablet Take 1 tablet (25 mg total) by mouth every 6 (six) hours for 4 days. 16 tablet 0   . EPINEPHrine (EPIPEN) 0.3 mg/0.3 mL SOAJ injection Inject 0.3 mLs (0.3 mg total) into the muscle as needed. 2 Device 0   . famotidine (PEPCID) 20 MG tablet Take 1 tablet (20 mg total) by mouth 2 (two) times daily for 4 days. 8 tablet 0   . ibuprofen (ADVIL,MOTRIN) 800 MG tablet Take 1 tablet (800 mg total) by mouth 3 (three) times daily. 21  tablet 0   . ibuprofen (CHILD IBUPROFEN) 100 MG/5ML suspension Take 30 mLs (600 mg total) by mouth every 6 (six) hours as needed. (Patient not taking: Reported on 07/23/2017) 273 mL 1   . oxycodone-acetaminophen (PERCOCET) 2.5-325 MG tablet Take 1 tablet by mouth every 4 (four) hours as needed for pain. (Patient not taking: Reported on 01/04/2018) 30 tablet 0     Review of Systems  Constitutional: Negative for fever.  Respiratory: Negative for shortness of breath.   Gastrointestinal: Positive for abdominal pain.  Genitourinary: Negative for vaginal bleeding, vaginal discharge and vaginal pain.  Musculoskeletal: Negative for back pain.  Neurological: Negative for headaches.  All other systems reviewed and are negative.  Physical Exam   Blood pressure (!) 143/84, pulse 88, temperature 98.4 F (36.9 C), temperature source Oral, resp. rate 18, height 5\' 5"  (1.651 m), weight 131.1 kg, last  menstrual period 03/30/2019, SpO2 100 %, unknown if currently breastfeeding.  Physical Exam  Nursing note and vitals reviewed. Constitutional: She is oriented to person, place, and time. She appears well-developed and well-nourished.  Cardiovascular: Normal rate.  Respiratory: Effort normal and breath sounds normal.  GI: Soft. Bowel sounds are normal. She exhibits no distension. There is no abdominal tenderness. There is no rebound, no guarding and no CVA tenderness.  Neurological: She is alert and oriented to person, place, and time.  Skin: Skin is warm and dry.  Psychiatric: She has a normal mood and affect. Her behavior is normal. Judgment and thought content normal.    MAU Course/MDM  Procedures: blind swab, ultrasound  --Presence of squam on abnormal UA suggested contaminated catch. Will order urine culture  Patient Vitals for the past 24 hrs:  BP Temp Temp src Pulse Resp SpO2 Height Weight  05/11/19 1326 120/88 -- -- 98 -- -- -- --  05/11/19 1105 136/67 -- -- 78 -- -- -- --  05/11/19 1022 (!) 143/84 98.4 F (36.9 C) Oral 88 18 100 % 5\' 5"  (1.651 m) 131.1 kg   Results for orders placed or performed during the hospital encounter of 05/11/19 (from the past 24 hour(s))  Pregnancy, urine POC     Status: Abnormal   Collection Time: 05/11/19 10:35 AM  Result Value Ref Range   Preg Test, Ur POSITIVE (A) NEGATIVE  Urinalysis, Routine w reflex microscopic     Status: Abnormal   Collection Time: 05/11/19 10:42 AM  Result Value Ref Range   Color, Urine YELLOW YELLOW   APPearance CLOUDY (A) CLEAR   Specific Gravity, Urine 1.015 1.005 - 1.030   pH 7.0 5.0 - 8.0   Glucose, UA NEGATIVE NEGATIVE mg/dL   Hgb urine dipstick NEGATIVE NEGATIVE   Bilirubin Urine NEGATIVE NEGATIVE   Ketones, ur NEGATIVE NEGATIVE mg/dL   Protein, ur NEGATIVE NEGATIVE mg/dL   Nitrite NEGATIVE NEGATIVE   Leukocytes,Ua LARGE (A) NEGATIVE   RBC / HPF 0-5 0 - 5 RBC/hpf   WBC, UA 11-20 0 - 5 WBC/hpf   Bacteria,  UA FEW (A) NONE SEEN   Squamous Epithelial / LPF >50 (H) 0 - 5   Mucus PRESENT    Amorphous Crystal PRESENT   Wet prep, genital     Status: Abnormal   Collection Time: 05/11/19 11:02 AM   Specimen: PATH Cytology Cervicovaginal Ancillary Only  Result Value Ref Range   Yeast Wet Prep HPF POC NONE SEEN NONE SEEN   Trich, Wet Prep NONE SEEN NONE SEEN   Clue Cells Wet Prep HPF POC NONE SEEN  NONE SEEN   WBC, Wet Prep HPF POC FEW (A) NONE SEEN   Sperm NONE SEEN   CBC     Status: Abnormal   Collection Time: 05/11/19 11:17 AM  Result Value Ref Range   WBC 7.6 4.0 - 10.5 K/uL   RBC 4.78 3.87 - 5.11 MIL/uL   Hemoglobin 11.8 (L) 12.0 - 15.0 g/dL   HCT 16.137.2 09.636.0 - 04.546.0 %   MCV 77.8 (L) 80.0 - 100.0 fL   MCH 24.7 (L) 26.0 - 34.0 pg   MCHC 31.7 30.0 - 36.0 g/dL   RDW 40.915.5 81.111.5 - 91.415.5 %   Platelets 234 150 - 400 K/uL   nRBC 0.0 0.0 - 0.2 %  hCG, quantitative, pregnancy     Status: Abnormal   Collection Time: 05/11/19 11:17 AM  Result Value Ref Range   hCG, Beta Chain, Quant, S 8,967 (H) <5 mIU/mL   US OB LESS THAN 14 WEEKS WITH OB TRANSVAGINAL  Result Date: 05/11/2019 CLINICAL DATA:  Pelvic pain EXAM: OBSTETRIC <14 WK US AND TRANSVAGINAL OB US TECHNIQUE: Both transabdominal and transvaginal ultrasound examinations were performed for complete evaluation of the gestation as well as the maternal uterus, adnexal regions, and pelvic cul-de-sac. Transvaginal technique was performed to assess early pregnancy. COMPARISON:  None. FINDINGS: Intrauterine gestational sac: Visualized Yolk sac: Visualized Embryo:  Not visualized Cardiac Activity: Not visualized MSD: 9 mm   5 w   5 d Subchorionic hemorrhage: There is a 1.3 x 0.4 cm subchorionic hemorrhage. Maternal uterus/adnexae: Cervical os is closed. Right ovary measures 2.9 x 1.9 x 3.2 cm. Left ovary measures 2.6 x 1.4 x 2.2 cm. There is a small hemorrhagic corpus luteum in the right ovary. A small amount of free pelvic fluid is noted. IMPRESSION: 1. Within  the uterus, there is a gestational sac containing a yolk sac. Fetal pole is not seen currently. Given absence of fetal pole, a follow-up ultrasound in 10-14 weeks is advised to assess for fetal pole and fetal heart activity. 2.  1.3 x 0.4 cm subchorionic hemorrhage. 3. Mild hemorrhage in a corpus luteum on the right. Small amount of free pelvic fluid may be due to recent cyst/corpus luteum rupture. Electronically Signed   By: Bretta BangWilliam  Woodruff III M.D.   On: 05/11/2019 12:24   Assessment and Plan  --28 y.o. [id number] with [facility] at 5155w0d  --Subchorionic hematoma, pelvic rest advised --Urine culture in work --Pt declined rx for prenatal vitamins --Discharge home in stable condition  Calvert CantorSamantha C Leoda Smithhart, CNM 05/11/2019, 1:53 PM

## 2019-05-12 LAB — CULTURE, OB URINE

## 2019-05-12 LAB — GC/CHLAMYDIA PROBE AMP (~~LOC~~) NOT AT ARMC
Chlamydia: NEGATIVE
Comment: NEGATIVE
Comment: NORMAL
Neisseria Gonorrhea: NEGATIVE

## 2019-06-19 DIAGNOSIS — O09292 Supervision of pregnancy with other poor reproductive or obstetric history, second trimester: Secondary | ICD-10-CM | POA: Insufficient documentation

## 2019-06-19 DIAGNOSIS — Z8759 Personal history of other complications of pregnancy, childbirth and the puerperium: Secondary | ICD-10-CM | POA: Insufficient documentation

## 2019-07-22 ENCOUNTER — Other Ambulatory Visit (HOSPITAL_COMMUNITY): Payer: Self-pay | Admitting: Obstetrics and Gynecology

## 2019-07-22 DIAGNOSIS — Z363 Encounter for antenatal screening for malformations: Secondary | ICD-10-CM

## 2019-07-22 DIAGNOSIS — Z3A20 20 weeks gestation of pregnancy: Secondary | ICD-10-CM

## 2019-08-13 ENCOUNTER — Emergency Department (HOSPITAL_COMMUNITY)
Admission: EM | Admit: 2019-08-13 | Discharge: 2019-08-13 | Disposition: A | Discharge status: Home / Self Care | Payer: BC Managed Care – PPO | Attending: Emergency Medicine | Admitting: Emergency Medicine

## 2019-08-13 ENCOUNTER — Encounter (HOSPITAL_COMMUNITY): Payer: Self-pay | Admitting: Emergency Medicine

## 2019-08-13 ENCOUNTER — Other Ambulatory Visit: Payer: Self-pay

## 2019-08-13 ENCOUNTER — Emergency Department (HOSPITAL_COMMUNITY): Payer: BC Managed Care – PPO

## 2019-08-13 DIAGNOSIS — O9A312 Physical abuse complicating pregnancy, second trimester: Secondary | ICD-10-CM | POA: Insufficient documentation

## 2019-08-13 DIAGNOSIS — Z3A19 19 weeks gestation of pregnancy: Secondary | ICD-10-CM | POA: Diagnosis not present

## 2019-08-13 DIAGNOSIS — R6884 Jaw pain: Secondary | ICD-10-CM | POA: Diagnosis not present

## 2019-08-13 DIAGNOSIS — R220 Localized swelling, mass and lump, head: Secondary | ICD-10-CM | POA: Diagnosis not present

## 2019-08-13 NOTE — Discharge Instructions (Addendum)
May take Tylenol for your pain.  You may apply ice or heat.  If you continue to have pain follow-up with ear nose and throat.  I refer you to one at discharge. You will need to call and schedule an appointment.
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
## 2019-08-19 ENCOUNTER — Other Ambulatory Visit (HOSPITAL_COMMUNITY): Payer: Self-pay | Admitting: *Deleted

## 2019-08-19 ENCOUNTER — Ambulatory Visit (HOSPITAL_COMMUNITY)
Admission: RE | Admit: 2019-08-19 | Discharge: 2019-08-19 | Disposition: A | Discharge status: Home / Self Care | Payer: BC Managed Care – PPO | Source: Ambulatory Visit | Attending: Obstetrics and Gynecology | Admitting: Obstetrics and Gynecology

## 2019-08-19 ENCOUNTER — Other Ambulatory Visit (HOSPITAL_COMMUNITY): Payer: Self-pay | Admitting: Obstetrics and Gynecology

## 2019-08-19 ENCOUNTER — Other Ambulatory Visit: Payer: Self-pay

## 2019-08-19 DIAGNOSIS — Z363 Encounter for antenatal screening for malformations: Secondary | ICD-10-CM | POA: Insufficient documentation

## 2019-08-19 DIAGNOSIS — O09299 Supervision of pregnancy with other poor reproductive or obstetric history, unspecified trimester: Secondary | ICD-10-CM | POA: Diagnosis not present

## 2019-08-19 DIAGNOSIS — Z3A20 20 weeks gestation of pregnancy: Secondary | ICD-10-CM | POA: Insufficient documentation

## 2019-08-19 DIAGNOSIS — O99212 Obesity complicating pregnancy, second trimester: Secondary | ICD-10-CM

## 2019-08-20 ENCOUNTER — Encounter (HOSPITAL_COMMUNITY): Payer: Self-pay | Admitting: Obstetrics and Gynecology

## 2019-09-16 ENCOUNTER — Ambulatory Visit (HOSPITAL_COMMUNITY): Payer: BC Managed Care – PPO

## 2019-09-29 ENCOUNTER — Other Ambulatory Visit: Payer: Self-pay

## 2019-09-29 ENCOUNTER — Ambulatory Visit: Payer: BC Managed Care – PPO | Attending: Obstetrics and Gynecology

## 2019-09-29 ENCOUNTER — Other Ambulatory Visit: Payer: Self-pay | Admitting: *Deleted

## 2019-09-29 DIAGNOSIS — Z363 Encounter for antenatal screening for malformations: Secondary | ICD-10-CM | POA: Diagnosis present

## 2019-09-29 DIAGNOSIS — Z362 Encounter for other antenatal screening follow-up: Secondary | ICD-10-CM | POA: Diagnosis not present

## 2019-09-29 DIAGNOSIS — O09292 Supervision of pregnancy with other poor reproductive or obstetric history, second trimester: Secondary | ICD-10-CM | POA: Diagnosis not present

## 2019-09-29 DIAGNOSIS — Z6841 Body Mass Index (BMI) 40.0 and over, adult: Secondary | ICD-10-CM

## 2019-09-29 DIAGNOSIS — E669 Obesity, unspecified: Secondary | ICD-10-CM | POA: Diagnosis not present

## 2019-09-29 DIAGNOSIS — Z3A26 26 weeks gestation of pregnancy: Secondary | ICD-10-CM

## 2019-09-29 DIAGNOSIS — O99212 Obesity complicating pregnancy, second trimester: Secondary | ICD-10-CM | POA: Diagnosis not present

## 2019-11-10 ENCOUNTER — Ambulatory Visit: Payer: BC Managed Care – PPO | Attending: Obstetrics and Gynecology

## 2019-11-10 ENCOUNTER — Other Ambulatory Visit: Payer: Self-pay

## 2019-11-10 ENCOUNTER — Other Ambulatory Visit: Payer: Self-pay | Admitting: *Deleted

## 2019-11-10 ENCOUNTER — Other Ambulatory Visit: Payer: Self-pay | Admitting: Obstetrics

## 2019-11-10 ENCOUNTER — Ambulatory Visit: Payer: BC Managed Care – PPO | Admitting: *Deleted

## 2019-11-10 VITALS — BP 130/73 | HR 94

## 2019-11-10 DIAGNOSIS — O0990 Supervision of high risk pregnancy, unspecified, unspecified trimester: Secondary | ICD-10-CM | POA: Diagnosis present

## 2019-11-10 DIAGNOSIS — Z362 Encounter for other antenatal screening follow-up: Secondary | ICD-10-CM

## 2019-11-10 DIAGNOSIS — O403XX0 Polyhydramnios, third trimester, not applicable or unspecified: Secondary | ICD-10-CM

## 2019-11-10 DIAGNOSIS — O09293 Supervision of pregnancy with other poor reproductive or obstetric history, third trimester: Secondary | ICD-10-CM

## 2019-11-10 DIAGNOSIS — Z6841 Body Mass Index (BMI) 40.0 and over, adult: Secondary | ICD-10-CM | POA: Diagnosis present

## 2019-11-10 DIAGNOSIS — Z3A32 32 weeks gestation of pregnancy: Secondary | ICD-10-CM

## 2019-11-10 DIAGNOSIS — O321XX0 Maternal care for breech presentation, not applicable or unspecified: Secondary | ICD-10-CM

## 2019-11-10 DIAGNOSIS — O99213 Obesity complicating pregnancy, third trimester: Secondary | ICD-10-CM

## 2019-11-10 DIAGNOSIS — E668 Other obesity: Secondary | ICD-10-CM

## 2019-11-10 DIAGNOSIS — Z8759 Personal history of other complications of pregnancy, childbirth and the puerperium: Secondary | ICD-10-CM

## 2019-11-17 ENCOUNTER — Ambulatory Visit: Payer: BC Managed Care – PPO

## 2019-11-25 ENCOUNTER — Ambulatory Visit: Payer: BC Managed Care – PPO

## 2019-12-02 ENCOUNTER — Ambulatory Visit: Payer: BC Managed Care – PPO

## 2019-12-09 ENCOUNTER — Ambulatory Visit: Payer: BC Managed Care – PPO

## 2019-12-12 LAB — OB RESULTS CONSOLE GBS: GBS: NEGATIVE

## 2019-12-16 ENCOUNTER — Other Ambulatory Visit: Payer: Self-pay | Admitting: Obstetrics & Gynecology

## 2019-12-21 ENCOUNTER — Telehealth (HOSPITAL_COMMUNITY): Payer: Self-pay | Admitting: *Deleted

## 2019-12-21 ENCOUNTER — Encounter (HOSPITAL_COMMUNITY): Payer: Self-pay | Admitting: *Deleted

## 2019-12-21 NOTE — Telephone Encounter (Signed)
Preadmission screen  

## 2019-12-25 ENCOUNTER — Other Ambulatory Visit (HOSPITAL_COMMUNITY)
Admission: RE | Admit: 2019-12-25 | Discharge: 2019-12-25 | Disposition: A | Discharge status: Home / Self Care | Payer: BC Managed Care – PPO | Source: Ambulatory Visit | Attending: Obstetrics & Gynecology | Admitting: Obstetrics & Gynecology

## 2019-12-25 DIAGNOSIS — Z20822 Contact with and (suspected) exposure to covid-19: Secondary | ICD-10-CM | POA: Insufficient documentation

## 2019-12-25 DIAGNOSIS — Z01812 Encounter for preprocedural laboratory examination: Secondary | ICD-10-CM | POA: Insufficient documentation

## 2019-12-25 LAB — SARS CORONAVIRUS 2 (TAT 6-24 HRS): SARS Coronavirus 2: NEGATIVE

## 2019-12-27 ENCOUNTER — Other Ambulatory Visit: Payer: Self-pay

## 2019-12-27 ENCOUNTER — Inpatient Hospital Stay (HOSPITAL_COMMUNITY)
Admission: AD | Admit: 2019-12-27 | Discharge: 2019-12-31 | DRG: 786 | Disposition: A | Discharge status: Home / Self Care | Payer: BC Managed Care – PPO | Attending: Obstetrics and Gynecology | Admitting: Obstetrics and Gynecology

## 2019-12-27 ENCOUNTER — Inpatient Hospital Stay (HOSPITAL_COMMUNITY): Payer: BC Managed Care – PPO

## 2019-12-27 ENCOUNTER — Encounter (HOSPITAL_COMMUNITY): Payer: Self-pay | Admitting: Obstetrics & Gynecology

## 2019-12-27 DIAGNOSIS — D509 Iron deficiency anemia, unspecified: Secondary | ICD-10-CM | POA: Diagnosis present

## 2019-12-27 DIAGNOSIS — Z20822 Contact with and (suspected) exposure to covid-19: Secondary | ICD-10-CM | POA: Diagnosis present

## 2019-12-27 DIAGNOSIS — Z23 Encounter for immunization: Secondary | ICD-10-CM

## 2019-12-27 DIAGNOSIS — O403XX0 Polyhydramnios, third trimester, not applicable or unspecified: Principal | ICD-10-CM | POA: Diagnosis present

## 2019-12-27 DIAGNOSIS — O4593 Premature separation of placenta, unspecified, third trimester: Secondary | ICD-10-CM | POA: Diagnosis present

## 2019-12-27 DIAGNOSIS — O409XX0 Polyhydramnios, unspecified trimester, not applicable or unspecified: Secondary | ICD-10-CM | POA: Diagnosis present

## 2019-12-27 DIAGNOSIS — E041 Nontoxic single thyroid nodule: Secondary | ICD-10-CM | POA: Diagnosis present

## 2019-12-27 DIAGNOSIS — O99214 Obesity complicating childbirth: Secondary | ICD-10-CM | POA: Diagnosis present

## 2019-12-27 DIAGNOSIS — Z2839 Other underimmunization status: Secondary | ICD-10-CM

## 2019-12-27 DIAGNOSIS — Z3A38 38 weeks gestation of pregnancy: Secondary | ICD-10-CM | POA: Diagnosis not present

## 2019-12-27 DIAGNOSIS — O9902 Anemia complicating childbirth: Secondary | ICD-10-CM | POA: Diagnosis present

## 2019-12-27 DIAGNOSIS — O99284 Endocrine, nutritional and metabolic diseases complicating childbirth: Secondary | ICD-10-CM | POA: Diagnosis present

## 2019-12-27 DIAGNOSIS — Z98891 History of uterine scar from previous surgery: Secondary | ICD-10-CM

## 2019-12-27 DIAGNOSIS — Z283 Underimmunization status: Secondary | ICD-10-CM

## 2019-12-27 HISTORY — DX: Polyhydramnios, unspecified trimester, not applicable or unspecified: O40.9XX0

## 2019-12-27 LAB — CBC
HCT: 31.7 % — ABNORMAL LOW (ref 36.0–46.0)
Hemoglobin: 9.6 g/dL — ABNORMAL LOW (ref 12.0–15.0)
MCH: 22.8 pg — ABNORMAL LOW (ref 26.0–34.0)
MCHC: 30.3 g/dL (ref 30.0–36.0)
MCV: 75.3 fL — ABNORMAL LOW (ref 80.0–100.0)
Platelets: 195 10*3/uL (ref 150–400)
RBC: 4.21 MIL/uL (ref 3.87–5.11)
RDW: 16.7 % — ABNORMAL HIGH (ref 11.5–15.5)
WBC: 9.9 10*3/uL (ref 4.0–10.5)
nRBC: 0 % (ref 0.0–0.2)

## 2019-12-27 LAB — TYPE AND SCREEN
ABO/RH(D): O POS
Antibody Screen: NEGATIVE

## 2019-12-27 MED ORDER — TERBUTALINE SULFATE 1 MG/ML IJ SOLN: 0.2500 mg | Freq: Once | INTRAMUSCULAR | Status: DC | PRN

## 2019-12-27 MED ORDER — OXYTOCIN-SODIUM CHLORIDE 30-0.9 UT/500ML-% IV SOLN
1.0000 m[IU]/min | INTRAVENOUS | Status: DC
  Administered 2019-12-27 (×2): 2 m[IU]/min via INTRAVENOUS
  Filled 2019-12-27 (×2): qty 500

## 2019-12-27 MED ORDER — FENTANYL CITRATE (PF) 100 MCG/2ML IJ SOLN
50.0000 ug | INTRAMUSCULAR | Status: DC | PRN
  Administered 2019-12-27: 100 ug via INTRAVENOUS
  Administered 2019-12-27 (×2): 50 ug via INTRAVENOUS
  Administered 2019-12-27: 100 ug via INTRAVENOUS
  Filled 2019-12-27 (×4): qty 2

## 2019-12-27 MED ORDER — ACETAMINOPHEN 325 MG PO TABS: 650.0000 mg | ORAL_TABLET | ORAL | Status: DC | PRN

## 2019-12-27 MED ORDER — OXYTOCIN-SODIUM CHLORIDE 30-0.9 UT/500ML-% IV SOLN: 2.5000 [IU]/h | INTRAVENOUS | Status: DC

## 2019-12-27 MED ORDER — LACTATED RINGERS IV SOLN
500.0000 mL | INTRAVENOUS | Status: DC | PRN
  Administered 2019-12-28: 500 mL via INTRAVENOUS

## 2019-12-27 MED ORDER — ONDANSETRON HCL 4 MG/2ML IJ SOLN: 4.0000 mg | Freq: Four times a day (QID) | INTRAMUSCULAR | Status: DC | PRN

## 2019-12-27 MED ORDER — LIDOCAINE HCL (PF) 1 % IJ SOLN: 30.0000 mL | INTRAMUSCULAR | Status: DC | PRN

## 2019-12-27 MED ORDER — SOD CITRATE-CITRIC ACID 500-334 MG/5ML PO SOLN
30.0000 mL | ORAL | Status: DC | PRN
  Filled 2019-12-27: qty 30

## 2019-12-27 MED ORDER — LACTATED RINGERS IV SOLN: INTRAVENOUS | Status: DC

## 2019-12-27 MED ORDER — MISOPROSTOL 25 MCG QUARTER TABLET: 25.0000 ug | ORAL_TABLET | ORAL | Status: DC | PRN

## 2019-12-27 MED ORDER — OXYTOCIN BOLUS FROM INFUSION: 333.0000 mL | Freq: Once | INTRAVENOUS | Status: DC

## 2019-12-27 MED ORDER — MISOPROSTOL 50MCG HALF TABLET
50.0000 ug | ORAL_TABLET | ORAL | Status: DC | PRN
  Administered 2019-12-27 (×3): 50 ug via ORAL
  Filled 2019-12-27 (×3): qty 1

## 2019-12-27 NOTE — H&P (Signed)
OB ADMISSION/ HISTORY & PHYSICAL:  Admission Date: 12/27/2019 12:02 AM  Admit Diagnosis: Polyhydramnios [O40.9XX0]    Rose Manning is a 29 y.o. female presenting for IOL due to polyhydramnios. Last AFI was 30.7 on 12/22/19. States she has irregular contractions but they are not painful. Denies leaking of fluid or vaginal bleeding. Endorses + fetal movement.   Prenatal History: [id number]   EDC : 01/04/2020, by Last Menstrual Period  P[facility]atal care at [facility] since 12 weeks  Prenatal course complicated by: 1. Cyst on thyroid - to be evaluated after pregnancy 2. Anemia - on PO iron 3. Depressive disorder - stable off meds 4. History of migraines 5. Maternal obesity affecting pregnancy - BMI 48 6. Polyhydramnios - last AFI 30.7 on 8/3 7. History of preeclampsia - on baby aspirin 8. Rubella non-immune 9. History of shoulder dystocia - prior mild dystocia with 6lb 8oz baby   Prenatal Labs: ABO, Rh:   O POS Antibody: NEG (08/08 0127) Rubella:   Non-immune RPR:   Non-reactive HBsAg:   Non-reactive HIV:   Negative GBS: Negative/-- (07/24 0000)  1 hr Glucola : 95 Genetic Screening: Declines Ultrasound: vertex, posterior placenta, normal anatomy    Maternal Diabetes: No Genetic Screening: Declined Maternal Ultrasounds/Referrals: Normal Fetal Ultrasounds or other Referrals:  Referred to Materal Fetal Medicine  Maternal Substance Abuse:  No Significant Maternal Medications:  None Significant Maternal Lab Results:  Group B Strep negative Other Comments:  None  Medical / Surgical History : Past medical history:  Past Medical History:  Diagnosis Date  . Anxiety   . Depression   . History of gestational hypertension   . History of kidney stones   . Hx of varicella   . Migraine   . Obesity   . Pregnancy induced hypertension   . Thyroid cyst     Past surgical history:  Past Surgical History:  Procedure Laterality Date  . CHOLECYSTECTOMY  01/2009  . DILATION AND CURETTAGE OF UTERUS   2019   D&E  . DILATION AND EVACUATION Bilateral 08/14/2017   Procedure: Suction DILATATION AND EVACUATION;  Surgeon: Hoover Browns, MD;  Location: [facility];  Service: Gynecology;  Laterality: Bilateral;  . WISDOM TOOTH EXTRACTION      Family History:  Family History  Problem Relation Age of Onset  . Hypertension Mother   . Urolithiasis Mother   . Migraines Mother   . Hypertension Father   . Hypertension Maternal Grandmother   . Hypertension Maternal Grandfather   . Alzheimer's disease Maternal Grandfather   . Hypertension Paternal Grandmother   . Hypertension Paternal Grandfather   . Heart disease Paternal Grandfather   . Lupus Maternal Aunt   . Rheum arthritis Maternal Aunt   . Diabetes Cousin   . Anesthesia problems Neg Hx     Social History:  reports that she has never smoked. She has never used smokeless tobacco. She reports that she does not drink alcohol and does not use drugs.  Allergies: Shellfish allergy   Current Medications at time of admission:  Medications Prior to Admission  Medication Sig Dispense Refill Last Dose  . ASPIRIN 81 PO Take by mouth.   More than a month at Unknown time  . EPINEPHrine (EPIPEN) 0.3 mg/0.3 mL SOAJ injection Inject 0.3 mLs (0.3 mg total) into the muscle as needed. 2 Device 0   . Prenatal Vit-Fe Fumarate-FA (PRENATAL VITAMIN PO) Take by mouth.       Review of Systems: Review of Systems  All other systems  reviewed and are negative.  Physical Exam: Vital signs and nursing notes reviewed.  Patient Vitals for the past 24 hrs:  BP Temp Temp src Pulse Resp Height Weight  12/27/19 0230 (!) 121/55 -- -- 67 14 -- --  12/27/19 0200 (!) 113/47 -- -- 67 16 -- --  12/27/19 0131 114/70 -- -- 78 16 -- --  12/27/19 0038 125/68 98.6 F (37 C) Oral 90 16 -- --  12/27/19 0021 -- -- -- -- -- 5\' 5"  (1.651 m) (!) 143.2 kg    General: AAO x 3, NAD Heart: RRR Lungs:CTAB Abdomen: Gravid, NT, Leopold's 8 lbs Extremities: no edema Genitalia / VE:  Dilation: 1.5 Effacement (%): 70 Station: -2 Presentation: Vertex Exam by:: A Raife Lizer CNM   FHR: 140BPM, mod variability, + accels, no decels TOCO: Ctx occasional  Labs:   Pending T&S, CBC, RPR  Recent Labs    12/27/19 0127  WBC 9.9  HGB 9.6*  HCT 31.7*  PLT 195   Assessment:  29 y.o. 02/26/20 at [redacted]w[redacted]d, polyhydramnios, hx of shoulder dystocia  1. Induction of labor 2. FHR category 1 3. GBS negative 4. Anemia, admission hemoglobin 9.6  Plan:  1. Admit to BS 2. Routine L&D orders 3. Analgesia/anesthesia PRN  4. Pt declines cervical balloon due to previous painful experience in last birth 5. Will start Pitocin 2x2 until adequate contractions, consider AROM in active labor 6. Anticipate NSVB  Dr. [redacted]w[redacted]d notified of admission/plan of care  Richardson Dopp CNM, MSN 12/27/2019, 2:41 AM

## 2019-12-27 NOTE — Progress Notes (Signed)
Labor Progress Note  Rose Manning, 29 y.o., [phone], with an IUP @ [redacted]w[redacted]d, presented for polyhydramnios at 38.6 on 8/8 @ 0000. GBS-.   Prenatal course complicated by: 1. Cyst on thyroid - to be evaluated after pregnancy 2. Anemia - on PO iron 3. Depressive disorder - stable off meds 4. History of migraines 5. Maternal obesity affecting pregnancy - BMI 48 6. Polyhydramnios - last AFI 30.7 on 8/3 7. History of preeclampsia - on baby aspirin 8. Rubella non-immune 9. History of shoulder dystocia - prior mild dystocia with 6lb 8oz baby   Subjective: Pt stable in bed sleeping woke for exam, support person at bedside. Discussed plan of care with pt about stopping pitocin due to cervic still being thick and not real cxt noted with 16 pitocin. Discussed foley bulb and cytotec by PO and AROM when indicated, discussed R/B/A, pt verbalized to consent for change of Augmentin plan.  Patient Active Problem List   Diagnosis Date Noted  . Polyhydramnios 12/27/2019  . Elevated blood pressure reading without diagnosis of hypertension 05/11/2019  . Intrauterine pregnancy 05/11/2019  . Subchorionic hematoma in first trimester 05/11/2019  . Morbid obesity (HCC) 09/30/2013   Objective: BP 128/76   Pulse 68   Temp 98 F (36.7 C) (Oral)   Resp 16   Ht 5\' 5"  (1.651 m)   Wt (!) 143.2 kg   LMP 03/30/2019   BMI 52.52 kg/m  No intake/output data recorded. No intake/output data recorded. NST: FHR baseline 135 bpm, Variability: moderate, Accelerations:present, Decelerations:  Absent= Cat 1/Reactive CTX: 3-7 mins, lasting 60 seconds.  Uterus gravid, soft non tender, moderate to palpate with contractions.  SVE:  Dilation: 2 Effacement (%): 50 Station: Ballotable Exam by:: Elai Vanwyk CNM Pitocin at 16 mUn/min and turned off.   Assessment:  Rose Manning, 29 y.o., [phone], with an IUP @ [redacted]w[redacted]d, presented for polyhydramnios at 38.6 on 8/8 @ 0000. GBS-.   Prenatal course complicated by: 1. Cyst on  thyroid - to be evaluated after pregnancy 2. Anemia - on PO iron 3. Depressive disorder - stable off meds 4. History of migraines 5. Maternal obesity affecting pregnancy - BMI 48 6. Polyhydramnios - last AFI 30.7 on 8/3 7. History of preeclampsia - on baby aspirin 8. Rubella non-immune 9. History of shoulder dystocia - prior mild dystocia with 6lb 8oz baby   Pt not progressing on pitocin, cervic not ripe, bishop score of 4.  Patient Active Problem List   Diagnosis Date Noted  . Polyhydramnios 12/27/2019  . Elevated blood pressure reading without diagnosis of hypertension 05/11/2019  . Intrauterine pregnancy 05/11/2019  . Subchorionic hematoma in first trimester 05/11/2019  . Morbid obesity (HCC) 09/30/2013   NICHD: Category 1  Membranes: Intact, no s/s of infection  Induction:    Cytotec x64mcg PO @ 1113 on 8/8  Foley Bulb: inserted  8/8@0930   Pitocin - 16 and turned off to start Cytotec po.   Pain management:               IV pain management: x fentanyl @ 1109 on 8/8             Epidural placement:  PRN  GBS Negative  Plan: Continue labor plan Continuous monitoring Rest Ambulate Frequent position changes to facilitate fetal rotation and descent. Will reassess with cervical exam at 1330 or earlier if necessary Stop pitocin per protocol Foley bulb in place Give 10/8 cytotec PO Anticipate starting pitocin and AROM once cervix ripe and fetus in pelvis  cavity.  Anticipate labor progression and vaginal delivery.   [place] aware of plan and verbalized agreement.   Dale [place], NP-C, CNM, MSN 12/27/2019. 10:17 AM

## 2019-12-27 NOTE — Progress Notes (Signed)
Labor Progress Note  Rose Manning, 29 y.o., [phone], with an IUP @ [redacted]w[redacted]d, presented for polyhydramnios at 38.6 on 8/8 @ 0000. GBS-.   Prenatal course complicated by: 1. Cyst on thyroid - to be evaluated after pregnancy 2. Anemia - on PO iron 3. Depressive disorder - stable off meds 4. History of migraines 5. Maternal obesity affecting pregnancy - BMI 48 6. Polyhydramnios - last AFI 30.7 on 8/3 7. History of preeclampsia - on baby aspirin 8. Rubella non-immune 9. History of shoulder dystocia - prior mild dystocia with 6lb 8oz baby   Subjective: Pt stable and asleep in NAD with support at bedside. Pt endorses sleeping through cxt and feeling mild, RN report foley bulb out.  Patient Active Problem List   Diagnosis Date Noted  . Polyhydramnios 12/27/2019  . Elevated blood pressure reading without diagnosis of hypertension 05/11/2019  . Intrauterine pregnancy 05/11/2019  . Subchorionic hematoma in first trimester 05/11/2019  . Morbid obesity (HCC) 09/30/2013   Objective: BP 96/72   Pulse 63   Temp 98.3 F (36.8 C) (Oral)   Resp 16   Ht 5\' 5"  (1.651 m)   Wt (!) 143.2 kg   LMP 03/30/2019   BMI 52.52 kg/m  No intake/output data recorded. No intake/output data recorded. NST: FHR baseline 140 bpm, Variability: moderate, Accelerations:present, Decelerations:  Absent= Cat 1/Reactive CTX: 3-7 mins, lasting 60 seconds.  Uterus gravid, soft non tender, mild to palpate with contractions.  SVE:  Dilation: 3.5 Effacement (%): 50 Station: Ballotable Exam by:: Aashish Hamm CNM Pitocin at 16 mUn/min and turned off @ 0830 on 8/8.   Assessment:  Rose Manning, 29 y.o., [phone], with an IUP @ [redacted]w[redacted]d, presented for polyhydramnios at 38.6 on 8/8 @ 0000. GBS-.   Prenatal course complicated by: 1. Cyst on thyroid - to be evaluated after pregnancy 2. Anemia - on PO iron 3. Depressive disorder - stable off meds 4. History of migraines 5. Maternal obesity affecting pregnancy - BMI 48 6.  Polyhydramnios - last AFI 30.7 on 8/3 7. History of preeclampsia - on baby aspirin 8. Rubella non-immune 9. History of shoulder dystocia - prior mild dystocia with 6lb 8oz baby   Pt progressing on foley out and on po cytotec, cervic still not ripe, bishop score of 6.  Patient Active Problem List   Diagnosis Date Noted  . Polyhydramnios 12/27/2019  . Elevated blood pressure reading without diagnosis of hypertension 05/11/2019  . Intrauterine pregnancy 05/11/2019  . Subchorionic hematoma in first trimester 05/11/2019  . Morbid obesity (HCC) 09/30/2013   NICHD: Category 1  Membranes: Intact, no s/s of infection  Induction:    Cytotec x80mcg PO @ 1113 on 8/8  Foley Bulb: inserted  8/8@0930 , out @ 1300  Pitocin - 16 and turned off @ 0930 on 8/8.   Pain management:               IV pain management: x fentanyl @ 1109 on 8/8             Epidural placement:  PRN  GBS Negative  Plan: Continue labor plan Continuous monitoring Rest Ambulate Frequent position changes to facilitate fetal rotation and descent. Will reassess with cervical exam at 2000 or earlier if necessary Foley bulb out Continue with 10/8 cytotec PO Anticipate starting pitocin and AROM once cervix ripe and fetus in pelvis cavity.  Anticipate labor progression and vaginal delivery.   , NP-C, CNM, MSN 12/27/2019. 1:45 PM

## 2019-12-27 NOTE — Progress Notes (Signed)
Labor Progress Note  Chanele Douglas, 29 y.o., [phone], with an IUP @ [redacted]w[redacted]d, presented for polyhydramnios at 38.6 on 8/8 @ 0000. GBS-.   Prenatal course complicated by: 1. Cyst on thyroid - to be evaluated after pregnancy 2. Anemia - on PO iron 3. Depressive disorder - stable off meds 4. History of migraines 5. Maternal obesity affecting pregnancy - BMI 48 6. Polyhydramnios - last AFI 30.7 on 8/3 7. History of preeclampsia - on baby aspirin 8. Rubella non-immune 9. History of shoulder dystocia - prior mild dystocia with 6lb 8oz baby   Subjective: Pt stable and resting bed in NAD with partner and mother for support at bedside, reviewed POC again, agreed to one more Cytotec then restart pitocin at midnight, fetus still ballotable, anticipate AROM when regular rhythmic cxt begin.  Patient Active Problem List   Diagnosis Date Noted  . Polyhydramnios 12/27/2019  . Elevated blood pressure reading without diagnosis of hypertension 05/11/2019  . Intrauterine pregnancy 05/11/2019  . Subchorionic hematoma in first trimester 05/11/2019  . Morbid obesity (HCC) 09/30/2013   Objective: BP (!) 119/46   Pulse 61   Temp 98.4 F (36.9 C) (Oral)   Resp 16   Ht 5\' 5"  (1.651 m)   Wt (!) 143.2 kg   LMP 03/30/2019   BMI 52.52 kg/m  No intake/output data recorded. No intake/output data recorded. NST: FHR baseline 135 bpm, Variability: moderate, Accelerations:present, Decelerations:  Absent= Cat 1/Reactive CTX: 3-6 mins, lasting 60 seconds.  Uterus gravid, soft non tender, mild to palpate with contractions.  SVE:  Dilation: 3.5 Effacement (%): 50 Station: Ballotable Exam by:: [email or web address] RN Pitocin at 16 mUn/min and turned off @ 0830 on 8/8, plan to restart at 0000 on 8/9.   Assessment:  Hyacinth Marcelli, 29 y.o., [phone], with an IUP @ [redacted]w[redacted]d, presented for polyhydramnios at 38.6 on 8/8 @ 0000. GBS-.   Prenatal course complicated by: 1. Cyst on thyroid - to be evaluated after pregnancy 2.  Anemia - on PO iron 3. Depressive disorder - stable off meds 4. History of migraines 5. Maternal obesity affecting pregnancy - BMI 48 6. Polyhydramnios - last AFI 30.7 on 8/3 7. History of preeclampsia - on baby aspirin 8. Rubella non-immune 9. History of shoulder dystocia - prior mild dystocia with 6lb 8oz baby   Pt progressing on cytotec, s/p foley and pitocin, then back to cytotec for further cervical ripening.  Patient Active Problem List   Diagnosis Date Noted  . Polyhydramnios 12/27/2019  . Elevated blood pressure reading without diagnosis of hypertension 05/11/2019  . Intrauterine pregnancy 05/11/2019  . Subchorionic hematoma in first trimester 05/11/2019  . Morbid obesity (HCC) 09/30/2013   NICHD: Category 1  Membranes: Intact, no s/s of infection  Induction:    Cytotec x10mcg PO @ 1113, 1517, and 1931 on 8/8  Foley Bulb: inserted  8/8@0930 , out @ 1300 8/8  Pitocin - 16 and turned off @ 0930 on 8/8.   Pain management:               IV pain management: x fentanyl @ 1109, 1808, 1923 on 8/8             Epidural placement:  PRN  GBS Negative  Plan: Continue labor plan Continuous monitoring Rest Ambulate Frequent position changes to facilitate fetal rotation and descent. Will reassess with cervical exam if necessary, or at midnight prior to starting pitocin.  Continue with 10/8 cytotec PO for one more dose Plan to start pitocin  2x2 at midnight Anticipate AROM once cervix ripe and fetus in pelvis cavity.  Anticipate labor progression and vaginal delivery.   Dale [place], NP-C, CNM, MSN 12/27/2019. 7:59 PM

## 2019-12-27 NOTE — Anesthesia Preprocedure Evaluation (Addendum)
Anesthesia Evaluation  Patient identified by MRN, date of birth, ID band Patient awake    Reviewed: Allergy & Precautions, Patient's Chart, lab work & pertinent test results  Airway Mallampati: III  TM Distance: >3 FB Neck ROM: Full    Dental no notable dental hx. (+) Teeth Intact, Dental Advisory Given   Pulmonary neg pulmonary ROS,    Pulmonary exam normal breath sounds clear to auscultation       Cardiovascular Normal cardiovascular exam Rhythm:Regular Rate:Normal     Neuro/Psych  Headaches, PSYCHIATRIC DISORDERS Depression    GI/Hepatic negative GI ROS, Neg liver ROS,   Endo/Other  Morbid obesity  Renal/GU negative Renal ROS     Musculoskeletal   Abdominal (+) + obese,   Peds  Hematology  (+) anemia , Lab Results      Component                Value               Date                      WBC                      9.9                 12/27/2019                HGB                      9.6 (L)             12/27/2019                HCT                      31.7 (L)            12/27/2019                MCV                      75.3 (L)            12/27/2019                PLT                      195                 12/27/2019              Anesthesia Other Findings   Reproductive/Obstetrics (+) Pregnancy                            Anesthesia Physical Anesthesia Plan  ASA: III  Anesthesia Plan: Epidural   Post-op Pain Management:    Induction:   PONV Risk Score and Plan:   Airway Management Planned: Natural Airway  Additional Equipment:   Intra-op Plan:   Post-operative Plan:   Informed Consent: I have reviewed the patients History and Physical, chart, labs and discussed the procedure including the risks, benefits and alternatives for the proposed anesthesia with the patient or authorized representative who has indicated his/her understanding and acceptance.       Plan  Discussed with: Anesthesiologist  Anesthesia Plan Comments: (39 Wk G6P1 w anemia for LEA)  Anesthesia Quick Evaluation  

## 2019-12-28 ENCOUNTER — Inpatient Hospital Stay (HOSPITAL_COMMUNITY): Payer: BC Managed Care – PPO | Admitting: Anesthesiology

## 2019-12-28 ENCOUNTER — Encounter (HOSPITAL_COMMUNITY)
Admission: AD | Disposition: A | Discharge status: Home / Self Care | Payer: Self-pay | Source: Home / Self Care | Attending: Obstetrics and Gynecology

## 2019-12-28 ENCOUNTER — Encounter (HOSPITAL_COMMUNITY): Payer: Self-pay | Admitting: Obstetrics & Gynecology

## 2019-12-28 LAB — RPR: RPR Ser Ql: NONREACTIVE

## 2019-12-28 LAB — CBC
HCT: 32.4 % — ABNORMAL LOW (ref 36.0–46.0)
Hemoglobin: 10 g/dL — ABNORMAL LOW (ref 12.0–15.0)
MCH: 23.4 pg — ABNORMAL LOW (ref 26.0–34.0)
MCHC: 30.9 g/dL (ref 30.0–36.0)
MCV: 75.7 fL — ABNORMAL LOW (ref 80.0–100.0)
Platelets: 196 10*3/uL (ref 150–400)
RBC: 4.28 MIL/uL (ref 3.87–5.11)
RDW: 16.8 % — ABNORMAL HIGH (ref 11.5–15.5)
WBC: 10 10*3/uL (ref 4.0–10.5)
nRBC: 0 % (ref 0.0–0.2)

## 2019-12-28 SURGERY — Surgical Case
Anesthesia: Epidural

## 2019-12-28 MED ORDER — DIPHENHYDRAMINE HCL 50 MG/ML IJ SOLN: 12.5000 mg | INTRAMUSCULAR | Status: DC | PRN

## 2019-12-28 MED ORDER — NALBUPHINE HCL 10 MG/ML IJ SOLN: 5.0000 mg | Freq: Once | INTRAMUSCULAR | Status: DC | PRN

## 2019-12-28 MED ORDER — SCOPOLAMINE 1 MG/3DAYS TD PT72
1.0000 | MEDICATED_PATCH | Freq: Once | TRANSDERMAL | Status: AC
  Administered 2019-12-28: 1.5 mg via TRANSDERMAL

## 2019-12-28 MED ORDER — DEXAMETHASONE SODIUM PHOSPHATE 4 MG/ML IJ SOLN
INTRAMUSCULAR | Status: DC | PRN
  Administered 2019-12-28: 4 mg via INTRAVENOUS

## 2019-12-28 MED ORDER — METOCLOPRAMIDE HCL 5 MG/ML IJ SOLN
INTRAMUSCULAR | Status: AC
  Filled 2019-12-28: qty 2

## 2019-12-28 MED ORDER — NALOXONE HCL 4 MG/10ML IJ SOLN
1.0000 ug/kg/h | INTRAVENOUS | Status: DC | PRN
  Filled 2019-12-28: qty 5

## 2019-12-28 MED ORDER — SODIUM BICARBONATE 8.4 % IV SOLN
INTRAVENOUS | Status: DC | PRN
  Administered 2019-12-28: 5 mL via EPIDURAL
  Administered 2019-12-28: 7 mL via EPIDURAL
  Administered 2019-12-28: 3 mL via EPIDURAL

## 2019-12-28 MED ORDER — OXYCODONE HCL 5 MG/5ML PO SOLN: 5.0000 mg | Freq: Once | ORAL | Status: DC | PRN

## 2019-12-28 MED ORDER — FENTANYL CITRATE (PF) 100 MCG/2ML IJ SOLN
INTRAMUSCULAR | Status: DC | PRN
  Administered 2019-12-28: 50 ug via EPIDURAL

## 2019-12-28 MED ORDER — OXYCODONE HCL 5 MG PO TABS: 5.0000 mg | ORAL_TABLET | Freq: Once | ORAL | Status: DC | PRN

## 2019-12-28 MED ORDER — LACTATED RINGERS IV SOLN
500.0000 mL | Freq: Once | INTRAVENOUS | Status: AC
  Administered 2019-12-28: 500 mL via INTRAVENOUS

## 2019-12-28 MED ORDER — KETOROLAC TROMETHAMINE 30 MG/ML IJ SOLN
INTRAMUSCULAR | Status: AC
  Filled 2019-12-28: qty 1

## 2019-12-28 MED ORDER — ONDANSETRON HCL 4 MG/2ML IJ SOLN
INTRAMUSCULAR | Status: DC | PRN
  Administered 2019-12-28: 4 mg via INTRAVENOUS

## 2019-12-28 MED ORDER — FENTANYL CITRATE (PF) 100 MCG/2ML IJ SOLN
INTRAMUSCULAR | Status: AC
  Filled 2019-12-28: qty 2

## 2019-12-28 MED ORDER — PHENYLEPHRINE 40 MCG/ML (10ML) SYRINGE FOR IV PUSH (FOR BLOOD PRESSURE SUPPORT): 80.0000 ug | PREFILLED_SYRINGE | INTRAVENOUS | Status: DC | PRN

## 2019-12-28 MED ORDER — LIDOCAINE HCL (PF) 2 % IJ SOLN
INTRAMUSCULAR | Status: AC
  Filled 2019-12-28: qty 20

## 2019-12-28 MED ORDER — SIMETHICONE 80 MG PO CHEW
80.0000 mg | CHEWABLE_TABLET | Freq: Three times a day (TID) | ORAL | Status: DC
  Administered 2019-12-29 – 2019-12-31 (×5): 80 mg via ORAL
  Filled 2019-12-28 (×5): qty 1

## 2019-12-28 MED ORDER — SODIUM BICARBONATE 8.4 % IV SOLN
INTRAVENOUS | Status: AC
  Filled 2019-12-28: qty 50

## 2019-12-28 MED ORDER — FENTANYL CITRATE (PF) 100 MCG/2ML IJ SOLN: 25.0000 ug | INTRAMUSCULAR | Status: DC | PRN

## 2019-12-28 MED ORDER — PRENATAL MULTIVITAMIN CH
1.0000 | ORAL_TABLET | Freq: Every day | ORAL | Status: DC
  Administered 2019-12-29 – 2019-12-30 (×2): 1 via ORAL
  Filled 2019-12-28 (×2): qty 1

## 2019-12-28 MED ORDER — MEPERIDINE HCL 25 MG/ML IJ SOLN: 6.2500 mg | INTRAMUSCULAR | Status: DC | PRN

## 2019-12-28 MED ORDER — TETANUS-DIPHTH-ACELL PERTUSSIS 5-2.5-18.5 LF-MCG/0.5 IM SUSP: 0.5000 mL | Freq: Once | INTRAMUSCULAR | Status: DC

## 2019-12-28 MED ORDER — LACTATED RINGERS IV SOLN: INTRAVENOUS | Status: DC

## 2019-12-28 MED ORDER — SOD CITRATE-CITRIC ACID 500-334 MG/5ML PO SOLN
30.0000 mL | ORAL | Status: AC
  Administered 2019-12-28: 30 mL via ORAL

## 2019-12-28 MED ORDER — MEPERIDINE HCL 25 MG/ML IJ SOLN
INTRAMUSCULAR | Status: AC
  Filled 2019-12-28: qty 1

## 2019-12-28 MED ORDER — MEPERIDINE HCL 25 MG/ML IJ SOLN
INTRAMUSCULAR | Status: DC | PRN
  Administered 2019-12-28 (×2): 12.5 mg via INTRAVENOUS

## 2019-12-28 MED ORDER — ONDANSETRON HCL 4 MG/2ML IJ SOLN
INTRAMUSCULAR | Status: AC
  Filled 2019-12-28: qty 2

## 2019-12-28 MED ORDER — LIDOCAINE HCL (PF) 1 % IJ SOLN
INTRAMUSCULAR | Status: DC | PRN
  Administered 2019-12-28: 5 mL via EPIDURAL

## 2019-12-28 MED ORDER — COCONUT OIL OIL: 1.0000 "application " | TOPICAL_OIL | Status: DC | PRN

## 2019-12-28 MED ORDER — FENTANYL-BUPIVACAINE-NACL 0.5-0.125-0.9 MG/250ML-% EP SOLN
12.0000 mL/h | EPIDURAL | Status: DC | PRN
  Filled 2019-12-28: qty 250

## 2019-12-28 MED ORDER — MORPHINE SULFATE (PF) 0.5 MG/ML IJ SOLN
INTRAMUSCULAR | Status: AC
  Filled 2019-12-28: qty 10

## 2019-12-28 MED ORDER — EPHEDRINE 5 MG/ML INJ: 10.0000 mg | INTRAVENOUS | Status: DC | PRN

## 2019-12-28 MED ORDER — ACETAMINOPHEN 500 MG PO TABS
1000.0000 mg | ORAL_TABLET | Freq: Four times a day (QID) | ORAL | Status: AC
  Administered 2019-12-29 (×3): 1000 mg via ORAL
  Filled 2019-12-28 (×3): qty 2

## 2019-12-28 MED ORDER — DEXTROSE 5 % IV SOLN
3.0000 g | INTRAVENOUS | Status: DC
  Filled 2019-12-28: qty 3000

## 2019-12-28 MED ORDER — DEXAMETHASONE SODIUM PHOSPHATE 4 MG/ML IJ SOLN
INTRAMUSCULAR | Status: AC
  Filled 2019-12-28: qty 1

## 2019-12-28 MED ORDER — DIPHENHYDRAMINE HCL 25 MG PO CAPS: 25.0000 mg | ORAL_CAPSULE | Freq: Four times a day (QID) | ORAL | Status: DC | PRN

## 2019-12-28 MED ORDER — KETOROLAC TROMETHAMINE 30 MG/ML IJ SOLN
30.0000 mg | Freq: Four times a day (QID) | INTRAMUSCULAR | Status: AC | PRN
  Administered 2019-12-28 – 2019-12-29 (×2): 30 mg via INTRAVENOUS
  Filled 2019-12-28 (×2): qty 1

## 2019-12-28 MED ORDER — ZOLPIDEM TARTRATE 5 MG PO TABS: 5.0000 mg | ORAL_TABLET | Freq: Every evening | ORAL | Status: DC | PRN

## 2019-12-28 MED ORDER — DIPHENHYDRAMINE HCL 25 MG PO CAPS
25.0000 mg | ORAL_CAPSULE | ORAL | Status: DC | PRN
  Administered 2019-12-29: 25 mg via ORAL
  Filled 2019-12-28: qty 1

## 2019-12-28 MED ORDER — PHENYLEPHRINE HCL (PRESSORS) 10 MG/ML IV SOLN
INTRAVENOUS | Status: DC | PRN
  Administered 2019-12-28: 80 ug via INTRAVENOUS

## 2019-12-28 MED ORDER — SENNOSIDES-DOCUSATE SODIUM 8.6-50 MG PO TABS
2.0000 | ORAL_TABLET | ORAL | Status: DC
  Administered 2019-12-29: 2 via ORAL
  Filled 2019-12-28 (×2): qty 2

## 2019-12-28 MED ORDER — DIBUCAINE (PERIANAL) 1 % EX OINT: 1.0000 "application " | TOPICAL_OINTMENT | CUTANEOUS | Status: DC | PRN

## 2019-12-28 MED ORDER — MENTHOL 3 MG MT LOZG: 1.0000 | LOZENGE | OROMUCOSAL | Status: DC | PRN

## 2019-12-28 MED ORDER — NALOXONE HCL 0.4 MG/ML IJ SOLN: 0.4000 mg | INTRAMUSCULAR | Status: DC | PRN

## 2019-12-28 MED ORDER — SODIUM CHLORIDE 0.9% FLUSH: 3.0000 mL | INTRAVENOUS | Status: DC | PRN

## 2019-12-28 MED ORDER — OXYCODONE-ACETAMINOPHEN 5-325 MG PO TABS
1.0000 | ORAL_TABLET | ORAL | Status: DC | PRN
  Administered 2019-12-29 – 2019-12-31 (×3): 1 via ORAL
  Filled 2019-12-28 (×3): qty 1

## 2019-12-28 MED ORDER — SODIUM CHLORIDE 0.9 % IR SOLN
Status: DC | PRN
  Administered 2019-12-28: 1000 mL

## 2019-12-28 MED ORDER — IBUPROFEN 600 MG PO TABS
600.0000 mg | ORAL_TABLET | Freq: Four times a day (QID) | ORAL | Status: DC | PRN
  Administered 2019-12-29 – 2019-12-31 (×7): 600 mg via ORAL
  Filled 2019-12-28 (×7): qty 1

## 2019-12-28 MED ORDER — SIMETHICONE 80 MG PO CHEW
80.0000 mg | CHEWABLE_TABLET | ORAL | Status: DC
  Administered 2019-12-29 – 2019-12-30 (×3): 80 mg via ORAL
  Filled 2019-12-28 (×3): qty 1

## 2019-12-28 MED ORDER — MORPHINE SULFATE (PF) 10 MG/ML IV SOLN
INTRAVENOUS | Status: DC | PRN
  Administered 2019-12-28: 3 mg via EPIDURAL

## 2019-12-28 MED ORDER — ONDANSETRON HCL 4 MG/2ML IJ SOLN: 4.0000 mg | Freq: Three times a day (TID) | INTRAMUSCULAR | Status: DC | PRN

## 2019-12-28 MED ORDER — EPINEPHRINE PF 1 MG/ML IJ SOLN
INTRAMUSCULAR | Status: AC
  Filled 2019-12-28: qty 1

## 2019-12-28 MED ORDER — NALBUPHINE HCL 10 MG/ML IJ SOLN: 5.0000 mg | INTRAMUSCULAR | Status: DC | PRN

## 2019-12-28 MED ORDER — DIPHENHYDRAMINE HCL 50 MG/ML IJ SOLN
12.5000 mg | INTRAMUSCULAR | Status: AC | PRN
  Administered 2019-12-28 (×3): 12.5 mg via INTRAVENOUS
  Filled 2019-12-28 (×2): qty 1

## 2019-12-28 MED ORDER — ONDANSETRON HCL 4 MG/2ML IJ SOLN: 4.0000 mg | Freq: Once | INTRAMUSCULAR | Status: DC | PRN

## 2019-12-28 MED ORDER — SIMETHICONE 80 MG PO CHEW: 80.0000 mg | CHEWABLE_TABLET | ORAL | Status: DC | PRN

## 2019-12-28 MED ORDER — OXYTOCIN-SODIUM CHLORIDE 30-0.9 UT/500ML-% IV SOLN: 2.5000 [IU]/h | INTRAVENOUS | Status: AC

## 2019-12-28 MED ORDER — OXYTOCIN-SODIUM CHLORIDE 30-0.9 UT/500ML-% IV SOLN
INTRAVENOUS | Status: DC | PRN
  Administered 2019-12-28: 450 mL via INTRAVENOUS

## 2019-12-28 MED ORDER — DEXTROSE 5 % IV SOLN
INTRAVENOUS | Status: DC | PRN
  Administered 2019-12-28: 3 g via INTRAVENOUS

## 2019-12-28 MED ORDER — KETOROLAC TROMETHAMINE 30 MG/ML IJ SOLN
30.0000 mg | Freq: Four times a day (QID) | INTRAMUSCULAR | Status: AC | PRN
  Administered 2019-12-28: 30 mg via INTRAMUSCULAR

## 2019-12-28 MED ORDER — SCOPOLAMINE 1 MG/3DAYS TD PT72
MEDICATED_PATCH | TRANSDERMAL | Status: AC
  Filled 2019-12-28: qty 1

## 2019-12-28 MED ORDER — SODIUM CHLORIDE (PF) 0.9 % IJ SOLN
INTRAMUSCULAR | Status: DC | PRN
  Administered 2019-12-28: 12 mL/h via EPIDURAL

## 2019-12-28 MED ORDER — OXYTOCIN-SODIUM CHLORIDE 30-0.9 UT/500ML-% IV SOLN
INTRAVENOUS | Status: AC
  Filled 2019-12-28: qty 500

## 2019-12-28 MED ORDER — LACTATED RINGERS IV SOLN: INTRAVENOUS | Status: DC | PRN

## 2019-12-28 MED ORDER — WITCH HAZEL-GLYCERIN EX PADS: 1.0000 "application " | MEDICATED_PAD | CUTANEOUS | Status: DC | PRN

## 2019-12-28 SURGICAL SUPPLY — 39 items
BENZOIN TINCTURE PRP APPL 2/3 (GAUZE/BANDAGES/DRESSINGS) ×3 IMPLANT
CHLORAPREP W/TINT 26ML (MISCELLANEOUS) ×3 IMPLANT
CLAMP CORD UMBIL (MISCELLANEOUS) IMPLANT
CLOSURE WOUND 1/2 X4 (GAUZE/BANDAGES/DRESSINGS) ×1
CLOTH BEACON ORANGE TIMEOUT ST (SAFETY) ×3 IMPLANT
DRAIN JACKSON PRT FLT 10 (DRAIN) ×3 IMPLANT
DRSG OPSITE POSTOP 4X10 (GAUZE/BANDAGES/DRESSINGS) ×3 IMPLANT
ELECT REM PT RETURN 9FT ADLT (ELECTROSURGICAL) ×3
ELECTRODE REM PT RTRN 9FT ADLT (ELECTROSURGICAL) ×1 IMPLANT
EVACUATOR SILICONE 100CC (DRAIN) ×3 IMPLANT
EXTRACTOR VACUUM M CUP 4 TUBE (SUCTIONS) IMPLANT
EXTRACTOR VACUUM M CUP 4' TUBE (SUCTIONS)
GLOVE BIO SURGEON STRL SZ 6.5 (GLOVE) ×2 IMPLANT
GLOVE BIO SURGEONS STRL SZ 6.5 (GLOVE) ×1
GLOVE BIOGEL PI IND STRL 7.0 (GLOVE) ×2 IMPLANT
GLOVE BIOGEL PI INDICATOR 7.0 (GLOVE) ×4
GOWN STRL REUS W/TWL LRG LVL3 (GOWN DISPOSABLE) ×6 IMPLANT
KIT ABG SYR 3ML LUER SLIP (SYRINGE) IMPLANT
NEEDLE HYPO 25X5/8 SAFETYGLIDE (NEEDLE) ×3 IMPLANT
NS IRRIG 1000ML POUR BTL (IV SOLUTION) ×3 IMPLANT
PACK C SECTION WH (CUSTOM PROCEDURE TRAY) ×3 IMPLANT
PAD OB MATERNITY 4.3X12.25 (PERSONAL CARE ITEMS) ×3 IMPLANT
PENCIL SMOKE EVAC W/HOLSTER (ELECTROSURGICAL) ×3 IMPLANT
RTRCTR C-SECT PINK 25CM LRG (MISCELLANEOUS) IMPLANT
STRIP CLOSURE SKIN 1/2X4 (GAUZE/BANDAGES/DRESSINGS) ×2 IMPLANT
SUT CHROMIC 0 CT 1 (SUTURE) ×3 IMPLANT
SUT MNCRL AB 3-0 PS2 27 (SUTURE) ×3 IMPLANT
SUT PLAIN 2 0 (SUTURE) ×6
SUT PLAIN 2 0 XLH (SUTURE) ×3 IMPLANT
SUT PLAIN ABS 2-0 CT1 27XMFL (SUTURE) ×2 IMPLANT
SUT SILK 2 0 SH (SUTURE) ×3 IMPLANT
SUT VIC AB 0 CTX 36 (SUTURE) ×12
SUT VIC AB 0 CTX36XBRD ANBCTRL (SUTURE) ×4 IMPLANT
SUT VIC AB 2-0 SH 27 (SUTURE)
SUT VIC AB 2-0 SH 27XBRD (SUTURE) IMPLANT
SYR 3ML 25GX5/8 SAFETY (SYRINGE) ×3 IMPLANT
TOWEL OR 17X24 6PK STRL BLUE (TOWEL DISPOSABLE) ×3 IMPLANT
TRAY FOLEY W/BAG SLVR 14FR LF (SET/KITS/TRAYS/PACK) ×3 IMPLANT
WATER STERILE IRR 1000ML POUR (IV SOLUTION) ×3 IMPLANT

## 2019-12-28 NOTE — Progress Notes (Signed)
Labor Progress Note  Rose Manning, 29 y.o., [phone], with an IUP @ [redacted]w[redacted]d, presented for polyhydramnios at 38.6 on 8/8 @ 0000. GBS-.   Prenatal course complicated by: 1. Cyst on thyroid - to be evaluated after pregnancy 2. Anemia - on PO iron 3. Depressive disorder - stable off meds 4. History of migraines 5. Maternal obesity affecting pregnancy - BMI 48 6. Polyhydramnios - last AFI 30.7 on 8/3 7. History of preeclampsia - on baby aspirin 8. Rubella non-immune 9. History of shoulder dystocia - prior mild dystocia with 6lb 8oz baby   Subjective: Pt stable and resting, more intense in cxt due to ptiocin, does endorses feeling pressure. RN reported restarted pitocin then pt requested epidural.  Patient Active Problem List   Diagnosis Date Noted  . Polyhydramnios 12/27/2019  . Elevated blood pressure reading without diagnosis of hypertension 05/11/2019  . Intrauterine pregnancy 05/11/2019  . Subchorionic hematoma in first trimester 05/11/2019  . Morbid obesity (HCC) 09/30/2013   Objective: BP (!) 113/91   Pulse (!) 58   Temp 98.3 F (36.8 C) (Oral)   Resp 16   Ht 5\' 5"  (1.651 m)   Wt (!) 143.2 kg   LMP 03/30/2019   SpO2 98%   BMI 52.52 kg/m  No intake/output data recorded. No intake/output data recorded. NST: FHR baseline 135 bpm, Variability: moderate, Accelerations:present, Decelerations:  Absent= Cat 1/Reactive CTX: 2-4 mins, lasting 80-100 seconds.  Uterus gravid, soft non tender, mild to palpate with contractions.  SVE:  Dilation: 4 Effacement (%): 50 Station: -2 Exam by:: [email or web address] RN Pitocin at 16 mUn/min and turned off @ 0830 on 8/8, plan to restart at 0000 on 8/9. Restarted at 0000 on 8/9 currently on 10 now.    Assessment:  Rose Manning, 29 y.o., [phone], with an IUP @ [redacted]w[redacted]d, presented for polyhydramnios at 38.6 on 8/8 @ 0000. GBS-.   Prenatal course complicated by: 1. Cyst on thyroid - to be evaluated after pregnancy 2. Anemia - on PO iron 3.  Depressive disorder - stable off meds 4. History of migraines 5. Maternal obesity affecting pregnancy - BMI 48 6. Polyhydramnios - last AFI 30.7 on 8/3 7. History of preeclampsia - on baby aspirin 8. Rubella non-immune 9. History of shoulder dystocia - prior mild dystocia with 6lb 8oz baby   Pt progressing in latent labor with pitocin now, requested epidural.  Patient Active Problem List   Diagnosis Date Noted  . Polyhydramnios 12/27/2019  . Elevated blood pressure reading without diagnosis of hypertension 05/11/2019  . Intrauterine pregnancy 05/11/2019  . Subchorionic hematoma in first trimester 05/11/2019  . Morbid obesity (HCC) 09/30/2013   NICHD: Category 1  Membranes: Intact, no s/s of infection  Induction:    Cytotec x58mcg PO @ 1113, 1517, and 1931 on 8/8  Foley Bulb: inserted  8/8@0930 , out @ 1300 8/8  Pitocin - 16 and turned off @ 0930 on 8/8. Restarted at 0000 on 8/9 currently on 10  Pain management:               IV pain management: x fentanyl @ 1109, 1808, 1923 on 8/8             Epidural placement: Placed on 8/9 @ 0215  GBS Negative  Plan: Continue labor plan Continuous monitoring Rest Frequent position changes to facilitate fetal rotation and descent. Will reassess with cervical exam if necessary, or at midnight prior to starting pitocin.  Continue to start pitocin 2x2 at midnight Anticipate AROM once  pt comfortable and fetus in pelvis cavity.  Anticipate labor progression and vaginal delivery.   Dale [place], NP-C, CNM, MSN 12/28/2019. 5:52 AM

## 2019-12-28 NOTE — Transfer of Care (Signed)
Immediate Anesthesia Transfer of Care Note  Patient: Rose Manning  Procedure(s) Performed: CESAREAN SECTION (N/A )  Patient Location: PACU  Anesthesia Type:Epidural  Level of Consciousness: awake, alert  and oriented  Airway & Oxygen Therapy: Patient Spontanous Breathing  Post-op Assessment: Report given to RN and Post -op Vital signs reviewed and stable  Post vital signs: Reviewed and stable  Last Vitals:  Vitals Value Taken Time  BP 112/57 12/28/19 1531  Temp    Pulse 64 12/28/19 1532  Resp 24 12/28/19 1532  SpO2 100 % 12/28/19 1532  Vitals shown include unvalidated device data.  Last Pain:  Vitals:   12/28/19 1243  TempSrc: Oral  PainSc:          Complications: No complications documented.

## 2019-12-28 NOTE — Anesthesia Procedure Notes (Signed)
Epidural Patient location during procedure: OB Start time: 12/28/2019 2:08 AM End time: 12/28/2019 2:24 AM  Staffing Anesthesiologist: Trevor Iha, MD Performed: anesthesiologist   Preanesthetic Checklist Completed: patient identified, IV checked, site marked, risks and benefits discussed, surgical consent, monitors and equipment checked, pre-op evaluation and timeout performed  Epidural Patient position: sitting Prep: DuraPrep and site prepped and draped Patient monitoring: continuous pulse ox and blood pressure Approach: midline Location: L3-L4 Injection technique: LOR air  Needle:  Needle type: Tuohy  Needle gauge: 17 G Needle length: 9 cm and 9 Needle insertion depth: 9 cm Catheter type: closed end flexible Catheter size: 19 Gauge Catheter at skin depth: 10 and 16 cm Test dose: negative  Assessment Events: blood not aspirated, injection not painful, no injection resistance, no paresthesia and negative IV test  Additional Notes Patient identified. Risks/Benefits/Options discussed with patient including but not limited to bleeding, infection, nerve damage, paralysis, failed block, incomplete pain control, headache, blood pressure changes, nausea, vomiting, reactions to medication both or allergic, itching and postpartum back pain. Confirmed with bedside nurse the patient's most recent platelet count. Confirmed with patient that they are not currently taking any anticoagulation, have any bleeding history or any family history of bleeding disorders. Patient expressed understanding and wished to proceed. All questions were answered. Sterile technique was used throughout the entire procedure. Please see nursing notes for vital signs. Test dose was given through epidural needle and negative prior to continuing to dose epidural or start infusion. Warning signs of high block given to the patient including shortness of breath, tingling/numbness in hands, complete motor block, or any  concerning symptoms with instructions to call for help. Patient was given instructions on fall risk and not to get out of bed. All questions and concerns addressed with instructions to call with any issues.  2 Attempt (S) . Patient tolerated procedure well.

## 2019-12-28 NOTE — Anesthesia Postprocedure Evaluation (Signed)
Anesthesia Post Note  Patient: Rose Manning  Procedure(s) Performed: CESAREAN SECTION (N/A )     Patient location during evaluation: PACU Anesthesia Type: Epidural Level of consciousness: oriented and awake and alert Pain management: pain level controlled Vital Signs Assessment: post-procedure vital signs reviewed and stable Respiratory status: spontaneous breathing, respiratory function stable and nonlabored ventilation Cardiovascular status: blood pressure returned to baseline and stable Postop Assessment: no headache, no backache, no apparent nausea or vomiting and epidural receding Anesthetic complications: no   No complications documented.  Last Vitals:  Vitals:   12/28/19 1630 12/28/19 1640  BP: (!) 109/52 111/65  Pulse: (!) 57 (!) 55  Resp: (!) 23 20  Temp:    SpO2: 96% 99%    Last Pain:  Vitals:   12/28/19 1640  TempSrc:   PainSc: (P) 3    Pain Goal: Patients Stated Pain Goal: (P) 3 (12/28/19 1640)  LLE Motor Response: Purposeful movement (12/28/19 1630) LLE Sensation: Tingling (12/28/19 1630) RLE Motor Response: Purposeful movement (12/28/19 1630) RLE Sensation: Full sensation (12/28/19 1630)     Epidural/Spinal Function Cutaneous sensation: (P) Tingles (12/28/19 1640), Patient able to flex knees: (P) Yes (12/28/19 1640), Patient able to lift hips off bed: (P) Yes (12/28/19 1640), Back pain beyond tenderness at insertion site: (P) No (12/28/19 1640), Progressively worsening motor and/or sensory loss: (P) No (12/28/19 1640), Bowel and/or bladder incontinence post epidural: (P) No (12/28/19 1640)  Lucretia Kern

## 2019-12-28 NOTE — Progress Notes (Signed)
Subjective:    Comfortable w/ epidural. Pt's mother and S/O present and supportive. Informed by RN that IV tubing was occlude and not infusing properly. Late decels noted after occlusion was resolved and Pitocin dc'd for Cat 2 surveillance. Discussed plan of care re: FHR and interventions. Answered pt's questions RE: C-Section. Plan to place IUPC and restart Pitocin discussed and pt agrees.   Objective:    VS: BP (!) 106/41   Pulse 73   Temp 98.9 F (37.2 C) (Oral)   Resp 16   Ht 5\' 5"  (1.651 m)   Wt (!) 143.2 kg   LMP 03/30/2019   SpO2 98%   BMI 52.52 kg/m  FHR : baseline 145 / variability moderate / accelerations absent / absent decelerations IUPC inserted Membranes: AROM since 0604 on 8/9 Dilation: 6 Effacement (%): 80 Cervical Position: Middle Station: -2 Presentation: Vertex Exam by:: [email or web address], CNM  Assessment/Plan:   29 y.o. 38 [redacted]w[redacted]d  Labor: Progressing, will restart Pitocin  Preeclampsia:  no signs or symptoms of toxicity Fetal Wellbeing:  Category I after interventions Pain Control:  Epidural I/D:  GBS negative Anticipated MOD:  NSVD   Dr. [redacted]w[redacted]d updated RE: fetal surveillance and plan.   Normand Sloop MSN, CNM 12/28/2019 10:25 AM

## 2019-12-28 NOTE — Progress Notes (Signed)
Pt is comfortable lying in bed BP (!) 110/57   Pulse 79   Temp 99.1 F (37.3 C) (Oral)   Resp 16   Ht 5\' 5"  (1.651 m)   Wt (!) 143.2 kg   LMP 03/30/2019   SpO2 98%   BMI 52.52 kg/m  cx unchanged FHTS  Occasional variable with accels and good LTV toco irreg Pt refused pitocin and asked for a CS because of the variables and mild decelrations earlier on pitocin.   I offered amnioinfusion with slow restart of pitocin however pt declined Will proceed with CS Pt understands the risks are but not limited to bleeding, infection damage to internal organs such as bowel and bladder and wound separation and infection

## 2019-12-28 NOTE — Progress Notes (Addendum)
Subjective:    Notified by RN of variables. Fetal surveillance reviewed w/ Dr. Normand Sloop. Plan developed to restart Pitocin and plan for amnioinfusion if variables return. Discussed plan w/ pt and pt refuses to have Pitocin restarted and refuses amnioinfusion for variables because she is concerned for the baby. Pt requesting a C-Section. Dr. Normand Sloop updated and will see pt to discuss next steps. Pitocin remains off at this time.   Objective:    VS: BP (!) 110/57   Pulse 79   Temp 99.1 F (37.3 C) (Oral)   Resp 16   Ht 5\' 5"  (1.651 m)   Wt (!) 143.2 kg   LMP 03/30/2019   SpO2 98%   BMI 52.52 kg/m  FHR : baseline 145 / variability moderate / accelerations present / variable decelerations IUPC: contractions every 8-12 minutes/ MVU 60 Membranes: AROM since 0604 Dilation: 6 Effacement (%): 80 Cervical Position: Middle Station: -2 Presentation: Vertex Exam by:: [email or web address], CNM  Assessment/Plan:   29 y.o. 38 [redacted]w[redacted]d  Labor: not adequate, MVU 60, refuses to have Pitocin restarted Preeclampsia:  no signs or symptoms of toxicity Fetal Wellbeing:  Category I in the absence of ctx Pain Control:  Epidural I/D:  GBS negative Anticipated MOD:  possible primary C/Section.   [redacted]w[redacted]d MSN, CNM 12/28/2019 12:55 PM

## 2019-12-28 NOTE — Progress Notes (Signed)
Labor Progress Note  Rose Manning, 29 y.o., [phone], with an IUP @ [redacted]w[redacted]d, presented for polyhydramnios at 38.6 on 8/8 @ 0000. GBS-.   Prenatal course complicated by: 1. Cyst on thyroid - to be evaluated after pregnancy 2. Anemia - on PO iron 3. Depressive disorder - stable off meds 4. History of migraines 5. Maternal obesity affecting pregnancy - BMI 48 6. Polyhydramnios - last AFI 30.7 on 8/3 7. History of preeclampsia - on baby aspirin 8. Rubella non-immune 9. History of shoulder dystocia - prior mild dystocia with 6lb 8oz baby   Subjective: Pt stable and was sleeping woke for assessment. Pt comfortable with epidural, Verbalized consent and ready for AROM.  Patient Active Problem List   Diagnosis Date Noted  . Polyhydramnios 12/27/2019  . Elevated blood pressure reading without diagnosis of hypertension 05/11/2019  . Intrauterine pregnancy 05/11/2019  . Subchorionic hematoma in first trimester 05/11/2019  . Morbid obesity (HCC) 09/30/2013   Objective: BP (!) 130/97   Pulse (!) 169   Temp 98.3 F (36.8 C) (Oral)   Resp 16   Ht 5\' 5"  (1.651 m)   Wt (!) 143.2 kg   LMP 03/30/2019   SpO2 98%   BMI 52.52 kg/m  No intake/output data recorded. No intake/output data recorded. NST: FHR baseline 130 bpm, Variability: moderate, Accelerations:present, Decelerations:  Absent= Cat 1/Reactive CTX: 2-4 mins, lasting 80-100 seconds.  Uterus gravid, soft non tender, mild to palpate with contractions.  SVE:  Dilation: 4 Effacement (%): 50 Station: -1 Exam by:: J Cathye Kreiter CNM Pitocin at 16 mUn/min and turned off @ 0830 on 8/8, plan to restart at 0000 on 8/9. Restarted at 0000 on 8/9 currently on 16 now.    AROM, clear, large amount tolerated well.   Assessment:  Rose Manning, 29 y.o., [phone], with an IUP @ [redacted]w[redacted]d, presented for polyhydramnios at 38.6 on 8/8 @ 0000. GBS-.   Prenatal course complicated by: 1. Cyst on thyroid - to be evaluated after pregnancy 2. Anemia - on PO  iron 3. Depressive disorder - stable off meds 4. History of migraines 5. Maternal obesity affecting pregnancy - BMI 48 6. Polyhydramnios - last AFI 30.7 on 8/3 7. History of preeclampsia - on baby aspirin 8. Rubella non-immune 9. History of shoulder dystocia - prior mild dystocia with 6lb 8oz baby   Pt progressing in latent labor with pitocin and now AROM, comfortable with epidural.  Patient Active Problem List   Diagnosis Date Noted  . Polyhydramnios 12/27/2019  . Elevated blood pressure reading without diagnosis of hypertension 05/11/2019  . Intrauterine pregnancy 05/11/2019  . Subchorionic hematoma in first trimester 05/11/2019  . Morbid obesity (HCC) 09/30/2013   NICHD: Category 1  Membranes: AROM, large amount, clear, , no s/s of infection  Induction:    Cytotec x61mcg PO @ 1113, 1517, and 1931 on 8/8  Foley Bulb: inserted  8/8@0930 , out @ 1300 8/8  Pitocin - 16 and turned off @ 0930 on 8/8. Restarted at 0000 on 8/9 currently on 16  Pain management:               IV pain management: x fentanyl @ 1109, 1808, 1923 on 8/8             Epidural placement: Placed on 8/9 @ 0215  GBS Negative  Plan: Continue labor plan Continuous monitoring Rest Frequent position changes to facilitate fetal rotation and descent  Continue to start pitocin 2x2  Anticipate need for IUPC if unable to trace  cxt.  Anticipate labor progression and vaginal delivery.   D[place], NP-C, CNM, MSN 12/28/2019. 6:27 AM

## 2019-12-28 NOTE — Op Note (Signed)
Cesarean Section Procedure Note   Rose Manning  12/28/2019  Indications: Maternal Request because of decelarations when on pitocin   Pre-operative Diagnosis: c/s for maternal request per consent.  Variables and occ decelerations on pitocin.   Post-operative Diagnosis: Same and possible abruption  Procedure LTCS with 2 layer closure  Surgeon: Surgeon(s) and Role:    * Jaymes Graff, MD - Primary   Assistants: Maggie Surgical technician   Anesthesia: epidural   Procedure Details:  The patient was seen in the Holding Room. The risks, benefits, complications, treatment options, and expected outcomes were discussed with the patient. The patient concurred with the proposed plan, giving informed consent. identified as [name] and the procedure verified as C-Section Delivery. A Time Out was held and the above information confirmed.  After induction of anesthesia, the patient was draped and prepped in the usual sterile manner. A transverse incision was made and carried down through the subcutaneous tissue to the fascia. Fascial incision was made in the midline and extended transversely. The fascia was separated from the underlying rectus muscle superiorly and inferiorly. The peritoneum was identified and entered. Peritoneal incision was extended longitudinally with good visualization of bowel and bladder. The utero-vesical peritoneal reflection was incised transversely and the bladder flap was bluntly freed from the lower uterine segment.  An alexsis retractor was placed in the abdomen.   A low transverse uterine incision was made. Delivered from cephalic presentation was a  infant, with Apgar scores of 8 at one minute and 9 at five minutes. Cord ph was attempted could not get enough sample the umbilical cord was clamped and cut cord blood was obtained for evaluation. The placenta was removed Intact and appeared abnormal - with a huge clot c/w abruption. The uterine outline, tubes and  ovaries appeared normal}. The uterine incision was closed with running locked sutures of 0Vicryl. A second layer 0 vicrlyl was used to imbricate the uterine incision    Hemostasis was observed. Lavage was carried out until clear. The alexsis was removed.  The peritoneum was closed with 0 chromic.  The muscles were examined and any bleeders were made hemostatic using bovie cautery device.   The fascia was then reapproximated with running sutures of 0 vicryl.  The subcutaneous tissue was reapproximated  With interrupted stitches using 2-0 plain gut. The subcuticular closure was performed using 3-44monocryl     Instrument, sponge, and needle counts were correct prior the abdominal closure and were correct at the conclusion of the case.    Findings: infant was delivered from vtx presentation. The fluid was bloody and clot was seen on the placenta about 5 cm in size.   .  The uterus tubes and ovaries appeared normal.     Estimated Blood Loss: 572 ml   Total IV Fluids:   Urine Output: 200CC OF clear urine  Specimens: placenta to pathology  Complications: no complications  Disposition: PACU - hemodynamically stable.   Maternal Condition: stable   Baby condition / location:  Couplet care / Skin to Skin  Attending Attestation: I performed the procedure.   Signed: Surgeon(s): Jaymes Graff, MD

## 2019-12-29 ENCOUNTER — Encounter (HOSPITAL_COMMUNITY): Payer: Self-pay | Admitting: Anesthesiology

## 2019-12-29 DIAGNOSIS — Z98891 History of uterine scar from previous surgery: Secondary | ICD-10-CM

## 2019-12-29 DIAGNOSIS — Z2839 Other underimmunization status: Secondary | ICD-10-CM

## 2019-12-29 DIAGNOSIS — O99892 Other specified diseases and conditions complicating childbirth: Secondary | ICD-10-CM

## 2019-12-29 DIAGNOSIS — O9902 Anemia complicating childbirth: Secondary | ICD-10-CM

## 2019-12-29 LAB — CBC
HCT: 24.4 % — ABNORMAL LOW (ref 36.0–46.0)
Hemoglobin: 7.4 g/dL — ABNORMAL LOW (ref 12.0–15.0)
MCH: 23 pg — ABNORMAL LOW (ref 26.0–34.0)
MCHC: 30.3 g/dL (ref 30.0–36.0)
MCV: 75.8 fL — ABNORMAL LOW (ref 80.0–100.0)
Platelets: 168 10*3/uL (ref 150–400)
RBC: 3.22 MIL/uL — ABNORMAL LOW (ref 3.87–5.11)
RDW: 16.8 % — ABNORMAL HIGH (ref 11.5–15.5)
WBC: 11.8 10*3/uL — ABNORMAL HIGH (ref 4.0–10.5)
nRBC: 0 % (ref 0.0–0.2)

## 2019-12-29 MED ORDER — LACTATED RINGERS IV BOLUS
500.0000 mL | Freq: Once | INTRAVENOUS | Status: AC
  Administered 2019-12-29: 500 mL via INTRAVENOUS

## 2019-12-29 MED ORDER — POLYSACCHARIDE IRON COMPLEX 150 MG PO CAPS
150.0000 mg | ORAL_CAPSULE | Freq: Every day | ORAL | Status: DC
  Administered 2019-12-29 – 2019-12-31 (×3): 150 mg via ORAL
  Filled 2019-12-29 (×3): qty 1

## 2019-12-29 MED ORDER — SODIUM CHLORIDE 0.9 % IV SOLN: 750.0000 mg | Freq: Once | INTRAVENOUS | Status: DC

## 2019-12-29 MED ORDER — SODIUM CHLORIDE 0.9 % IV SOLN
500.0000 mg | Freq: Once | INTRAVENOUS | Status: AC
  Administered 2019-12-29: 500 mg via INTRAVENOUS
  Filled 2019-12-29: qty 25

## 2019-12-29 MED ORDER — SODIUM CHLORIDE 0.9 % IV SOLN: 510.0000 mg | Freq: Once | INTRAVENOUS | Status: DC

## 2019-12-29 MED ORDER — MAGNESIUM OXIDE 400 (241.3 MG) MG PO TABS
400.0000 mg | ORAL_TABLET | Freq: Every day | ORAL | Status: DC
  Administered 2019-12-29 – 2019-12-30 (×2): 400 mg via ORAL
  Filled 2019-12-29 (×3): qty 1

## 2019-12-29 MED ORDER — SODIUM CHLORIDE 0.9 % IV SOLN
INTRAVENOUS | Status: DC | PRN
  Administered 2019-12-29: 10 mL via INTRAVENOUS

## 2019-12-29 NOTE — Progress Notes (Signed)
Patient complained of pain and tenderness at IV site, IV site noted to be swollen. IV discontinued. Venofer IV was infusing at time, approximetly 78ml left in bag at time of stop. Patent states "I am a hard stick, I dont have good veins". Called on call CNM twice to make aware, awaiting response.   Tylene Fantasia, RN

## 2019-12-29 NOTE — Progress Notes (Signed)
CSW received consult for hx of Anxiety and Depression.  CSW met with [name] to offer support and complete assessment.    CSW congratulated [name] on the birth of infant. CSW advised [name] of CSW's role and the reason for the visit. [name] reported that she was diagnosed with anxiety and depression in 2018. [name] reported that she was placed on medication in the past but denied medication use during this pregnancy. [name] reports a desire to restart her medication once speaking her OB provider. [name] reported that she has some anxiety during her pregnancy and associated it with "life in general". CSW validated these feelings and asked MO how she was feeling now. [name] reported that since she has given birth she has felt really good. [name] denies anxiety, depression SI, HI and DV to this CSW.  [name] reported that she has all needed items to care for infant with follow up care at [facility].   CSW provided education regarding the baby blues period vs. perinatal mood disorders, discussed treatment. CSW recommends self-evaluation during the postpartum time period using the New Mom Checklist from Postpartum Progress and encouraged [name] to contact a medical professional if symptoms are noted at any time.   CSW provided review of Sudden Infant Death Syndrome (SIDS) precautions.   CSW identifies no further need for intervention and no barriers to discharge at this time.   Rose Manning, MSW, LCSW [facility] [place] [phone]

## 2019-12-29 NOTE — Progress Notes (Signed)
Subjective: POD# 1 Live born female  Birth Weight: 7 lb 0.2 oz (3181 g) APGAR: 8, 9  Newborn Delivery   Birth date/time: 12/28/2019 14:36:00 Delivery type: C-Section, Low Transverse Trial of labor: Yes C-section categorization: Primary     Baby name: Jaxxon Delivering provider: Jaymes Graff   Circumcision: yes, planning inpatient Feeding: breast  Pain control at delivery: Epidural   Reports feeling well, pain is well controlled. Has been ambulating to the bathroom. Foley remains in place, draining dark yellow urine.   Patient reports tolerating PO.   Breast symptoms:None Pain controlled with acetaminophen, ibuprofen (OTC) and narcotic analgesics including Oxy IR Denies HA/SOB/C/P/N/V/dizziness. Flatus no. She reports vaginal bleeding as normal, without clots.  She is ambulating to the bathroom. Foley remains in place.     Objective:   VS:   Vitals:   12/29/19 0110 12/29/19 0211 12/29/19 0351 12/29/19 0800  BP:   (!) 116/59 (!) 99/57  Pulse:   (!) 59 (!) 48  Resp: 16 18 16 18   Temp:   97.6 F (36.4 C) 98.2 F (36.8 C)  TempSrc:   Oral Oral  SpO2:   98% 99%  Weight:      Height:         Intake/Output Summary (Last 24 hours) at 12/29/2019 1010 Last data filed at 12/29/2019 0800 Gross per 24 hour  Intake 3812.76 ml  Output 1322 ml  Net 2490.76 ml     Recent Labs    12/28/19 0139 12/29/19 0531  WBC 10.0 11.8*  HGB 10.0* 7.4*  HCT 32.4* 24.4*  PLT 196 168    Blood type: --/--/O POS (08/08 0127)  Rubella:   Non-immune   Physical Exam:  General: alert, cooperative and appears stated age CV: Regular rate and rhythm Resp: clear Abdomen: soft, nontender, normal bowel sounds Incision: clean, dry and intact Uterine Fundus: firm, below umbilicus, nontender Lochia: minimal and moderate Ext: extremities normal, atraumatic, no cyanosis or edema  Assessment/Plan: 29 y.o.   POD# 1. 38                  Principal Problem:   Postpartum care following cesarean  delivery 8/9 Active Problems:   Polyhydramnios   Status post primary low transverse cesarean section 8/9   Maternal anemia, with delivery   Rubella nonimmune status, delivered, current hospitalization  Doing well, stable.    Will discontinue Foley catheter this AM Pt is asymptomatic of anemia. Will start PO Niferex and mag oxide.         Advance diet as tolerated Encourage rest when baby rests Breastfeeding support Encourage to ambulate Routine post-op care Anticipate discharge tomorrow.   10/9, CNM, MSN 12/29/2019, 10:10 AM

## 2019-12-30 NOTE — Progress Notes (Signed)
Subjective: POD# 2 Live born female  Birth Weight: 7 lb 0.2 oz (3181 g) APGAR: 8, 9  Newborn Delivery   Birth date/time: 12/28/2019 14:36:00 Delivery type: C-Section, Low Transverse Trial of labor: Yes C-section categorization: Primary     Baby name: Jaxxon Delivering provider: Jaymes Graff   Circumcision: complete Feeding: breast  Pain control at delivery: Epidural   Reports feeling well, pain is well controlled. Has been ambulating to the bathroom.  JP drain with 30CC+, Patient reports tolerating PO.   Breast symptoms:None Pain controlled with acetaminophen, ibuprofen (OTC) and narcotic analgesics including Oxy IR Denies HA/SOB/C/P/N/V/dizziness. Flatus no. She reports vaginal bleeding as normal, without clots.  She is ambulating to the bathroom. Objective:   VS:   Vitals:   12/29/19 0800 12/29/19 1409 12/29/19 2224 12/30/19 0541  BP: (!) 99/57 118/68 129/69 (!) 121/52  Pulse: (!) 48 (!) 57 64 (!) 54  Resp: 18 18 18 18   Temp: 98.2 F (36.8 C)  98 F (36.7 C) 99 F (37.2 C)  TempSrc: Oral  Oral Oral  SpO2: 99% 100% 98% 100%  Weight:      Height:         Intake/Output Summary (Last 24 hours) at 12/30/2019 0843 Last data filed at 12/30/2019 0700 Gross per 24 hour  Intake 319.16 ml  Output 575 ml  Net -255.84 ml     Recent Labs    12/28/19 0139 12/29/19 0531  WBC 10.0 11.8*  HGB 10.0* 7.4*  HCT 32.4* 24.4*  PLT 196 168    Blood type: --/--/O POS (08/08 0127)  Rubella:   Non-immune   Physical Exam:  General: alert, cooperative and appears stated age CV: Regular rate and rhythm Resp: clear Abdomen: soft, nontender, normal bowel sounds Incision: clean, dry and intact Uterine Fundus: firm, below umbilicus, nontender Lochia: minimal and moderate Ext: extremities normal, atraumatic, no cyanosis or edema  Assessment/Plan: 29 y.o.   POD# 2. 38                  Principal Problem:   Postpartum care following cesarean delivery 8/9 Active Problems:    Polyhydramnios   Status post primary low transverse cesarean section 8/9   Maternal anemia, with delivery   Rubella nonimmune status, delivered, current hospitalization  Doing well, stable.     Pt is asymptomatic of anemia. Will continue PO Niferex and mag oxide.         Advance diet as tolerated Encourage rest when baby rests Breastfeeding support Encourage to ambulate Routine post-op care Obs JP drain overnight  Plan for DC of JP drain and DC home tomorrow  10/9, CNM, MSN 12/30/2019, 8:43 AM

## 2019-12-31 LAB — SURGICAL PATHOLOGY

## 2019-12-31 MED ORDER — MAGNESIUM OXIDE 400 (241.3 MG) MG PO TABS: 400.0000 mg | ORAL_TABLET | Freq: Every day | ORAL | Status: DC

## 2019-12-31 MED ORDER — POLYSACCHARIDE IRON COMPLEX 150 MG PO CAPS: 150.0000 mg | ORAL_CAPSULE | Freq: Every day | ORAL | 3 refills | Status: DC

## 2019-12-31 MED ORDER — MEASLES, MUMPS & RUBELLA VAC IJ SOLR
0.5000 mL | Freq: Once | INTRAMUSCULAR | Status: AC
  Administered 2019-12-31: 0.5 mL via SUBCUTANEOUS
  Filled 2019-12-31: qty 0.5

## 2019-12-31 MED ORDER — OXYCODONE-ACETAMINOPHEN 5-325 MG PO TABS: 1.0000 | ORAL_TABLET | Freq: Four times a day (QID) | ORAL | 0 refills | Status: AC | PRN

## 2019-12-31 MED ORDER — IBUPROFEN 600 MG PO TABS: 600.0000 mg | ORAL_TABLET | Freq: Four times a day (QID) | ORAL | 0 refills | Status: DC | PRN

## 2019-12-31 NOTE — Discharge Summary (Signed)
Postpartum Discharge Summary  Date of Service updated 12/31/19     Patient Name: Rose Manning DOB: Feb 24, 1991 MRN: [id number]  Date of admission: 12/27/2019 Delivery date:12/28/2019  Delivering provider: Crawford Givens  Date of discharge: 12/31/2019  Admitting diagnosis: Polyhydramnios [O40.9XX0] Intrauterine pregnancy: [redacted]w[redacted]d    Secondary diagnosis:  Principal Problem:   Postpartum care following cesarean delivery 8/9 Active Problems:   Polyhydramnios   Status post primary low transverse cesarean section 8/9   Maternal anemia, with delivery   Rubella nonimmune status, delivered, current hospitalization  Additional problems: none    Discharge diagnosis: Term Pregnancy Delivered and Anemia                                              Post partum procedures:Iron infusion and MMR vaccine Augmentation: AROM, Pitocin, Cytotec and IP Foley Complications: Large clot behind placenta c/w [facility] Induction of Labor With Cesarean Section   29y.o. yo [id number] admitted to the hospital 12/27/2019 for induction of labor. Patient had a labor course significant for induction of labor for polyhydramnios. The patient went for cesarean section due to maternal request after [id number] labor and concern for variable decels. Active labor was never achieved as ptrefused to have Pitocin for concerns for variables and requested a cesarean section.  . Delivery details are as follows: Membrane Rupture Time/Date: 6:04 AM ,12/28/2019   Delivery Method:C-Section, Low Transverse  Details of operation can be found in separate operative Note.  Patient had an uncomplicated postpartum course. She is ambulating, tolerating a regular diet, passing flatus, and urinating well.  Patient is discharged home in stable condition on 12/31/19.      Newborn Data: Birth date:12/28/2019  Birth time:2:36 PM  Gender:Female  Living status:Living  Apgars:8 ,9  Weight:3181 g                                  Magnesium Sulfate received: No BMZ received: No Rhophylac:N/A MMR:Yes Transfusion:No  Recieved iron infusion postpartum  Physical exam  Vitals:   12/30/19 0541 12/30/19 1505 12/30/19 2036 12/31/19 0528  BP: (!) 121/52 (!) 144/71 139/72 (!) 142/71  Pulse: (!) 54 (!) 57 62 72  Resp: _0 Temp: 99 F (37.2 C) 99 F (37.2 C) 98.4 F (36.9 C) 98.2 F (36.8 C)  TempSrc: Oral Oral Oral Oral  SpO2: 100% 100%  99%  Weight:      Height:       General: alert, cooperative and no distress Lochia: appropriate Uterine Fundus: firm Incision: Healing well with no significant drainage, JP drain removed, new honeycomb applied  DVT Evaluation: No evidence of DVT seen on physical exam. No cords or calf tenderness. No significant calf/ankle edema. Labs: Lab Results  Component Value Date   WBC 11.8 (H) 12/29/2019   HGB 7.4 (L) 12/29/2019   HCT 24.4 (L) 12/29/2019   MCV 75.8 (L) 12/29/2019   PLT 168 12/29/2019   CMP Latest Ref Rng & Units 01/04/2018  Glucose 70 - 99 mg/dL 120(H)  BUN 6 - 20 mg/dL 10  Creatinine 0.44 - 1.00 mg/dL 0.84  Sodium 135 - 145 mmol/L 140  Potassium 3.5 - 5.1 mmol/L 3.6  Chloride 98 - 111 mmol/L 108  CO2 22 -  32 mmol/L 24  Calcium 8.9 - 10.3 mg/dL 9.1  Total Protein 6.5 - 8.1 g/dL -  Total Bilirubin 0.3 - 1.2 mg/dL -  Alkaline Phos 38 - 126 U/L -  AST 15 - 41 U/L -  ALT 14 - 54 U/L -   Edinburgh Score: Edinburgh Postnatal Depression Scale Screening Tool 12/29/2019  I have been able to laugh and see the funny side of things. 0  I have looked forward with enjoyment to things. 0  I have blamed myself unnecessarily when things went wrong. 1  I have been anxious or worried for no good reason. 2  I have felt scared or panicky for no good reason. 0  Things have been getting on top of me. 0  I have been so unhappy that I have had difficulty sleeping. 1  I have felt sad or miserable. 0  I have been so unhappy that I have been crying.  1  The thought of harming myself has occurred to me. 0  Edinburgh Postnatal Depression Scale Total 5      After visit meds:  Allergies as of 12/31/2019      Reactions   Shellfish Allergy Anaphylaxis   Pt states was taken to ED and given epipen      Medication List    TAKE these medications   acetaminophen 325 MG tablet Commonly known as: TYLENOL Take 650 mg by mouth every 6 (six) hours as needed for mild pain or headache.   EPINEPHrine 0.3 mg/0.3 mL Soaj injection Commonly known as: EpiPen Inject 0.3 mLs (0.3 mg total) into the muscle as needed.   ibuprofen 600 MG tablet Commonly known as: ADVIL Take 1 tablet (600 mg total) by mouth every 6 (six) hours as needed for moderate pain.   iron polysaccharides 150 MG capsule Commonly known as: NIFEREX Take 1 capsule (150 mg total) by mouth daily. Start taking on: January 01, 2020   magnesium oxide 400 (241.3 Mg) MG tablet Commonly known as: MAG-OX Take 1 tablet (400 mg total) by mouth daily. Start taking on: January 01, 2020   metroNIDAZOLE 500 MG tablet Commonly known as: FLAGYL Take 500 mg by mouth 2 (two) times daily. 7 day supply   oxyCODONE-acetaminophen 5-325 MG tablet Commonly known as: PERCOCET/ROXICET Take 1 tablet by mouth every 6 (six) hours as needed for up to 5 days for moderate pain.   PRENATAL VITAMIN PO Take 1 tablet by mouth daily.            Discharge Care Instructions  (From admission, onward)         Start     Ordered   12/31/19 0000  If the dressing is still on your incision site when you go home, remove it on the third day after your surgery date. Remove dressing if it begins to fall off, or if it is dirty or damaged before the third day.        12/31/19 1227   12/31/19 0000  Change dressing       Comments: Apply a new honeycomb dressing prior to discharge.   12/31/19 1227           Discharge home in stable condition Infant Feeding: Breast Infant Disposition:home with  mother Discharge instruction: per After Visit Summary and Postpartum booklet. Activity: Advance as tolerated. Pelvic rest for 6 weeks.  Diet: low salt diet Anticipated Birth Control: POPs Postpartum Appointment:6 weeks Future Appointments:No future appointments. Follow up Visit:  Follow-up Information  Ob/Gyn, [place] Follow up in 2 week(s).   Specialty: Obstetrics and Gynecology Contact information: [address] [place] [address] [phone]                   12/31/2019 Arrie Eastern, CNM

## 2019-12-31 NOTE — Lactation Note (Signed)
This note was copied from a baby's chart. Lactation Consultation Note  Patient Name: Rose Manning Date: 12/31/2019 Reason for consult: Initial assessment   Mother is a P2 infant is 87 hours old and is now at 9 % wt loss.  Infant has had 13 stools and 8 wets since life.   Mother was given [facility] brochure and basic teaching done.  Mother reports that infant is feeding well. She reports that she has a slight amt of tenderness on her nipples when infant first latches.   Reviewed hand expression with mother. Observed large drops of colostrum. Mother was given a harmony hand pump with instructions. Mothers nipples are erect with compressible breast tissue. Mother has tiny scabs on the tips of her nipples.    Mother was observed with infant latched on at the left breast.when I arrived in the room. Infant swaddled in blankets and tee shirt. Infant only observed with non-nuttrive suckling. Suggested that mother rouse infant by taking blankets and tee-shirt off and switching infant to the alternate breast.  Observed infant suckling with audible swallows. Infant sustained latch for 20 mins.   Discussed treatment and prevention of engorgement.   Mother to continue to cue base feed infant and feed at least 8-12 times or more in 24 hours and advised to allow for cluster feeding infant as needed .  Mother to continue to due STS. Mother is aware of available LC services at [facility], BFSG'S, OP Dept, and phone # for questions or concerns about breastfeeding.  Mother receptive to all teaching and plan of care.     Maternal Data    Feeding Feeding Type: Breast Fed  LATCH Score Latch: Grasps breast easily, tongue down, lips flanged, rhythmical sucking.  Audible Swallowing: Spontaneous and intermittent  Type of Nipple: Everted at rest and after stimulation  Comfort (Breast/Nipple): Soft / non-tender  Hold (Positioning): No assistance needed to correctly position infant at breast.  LATCH  Score: 10  Interventions Interventions: Breast feeding basics reviewed;Assisted with latch;Skin to skin;Hand express;Breast compression;Support pillows;Position options;Expressed milk;Hand pump  Lactation Tools Discussed/Used Pump Review: Setup, frequency, and cleaning;Milk Storage   Consult Status      Michel Bickers 12/31/2019, 9:57 AM

## 2020-01-04 ENCOUNTER — Inpatient Hospital Stay (HOSPITAL_COMMUNITY): Admission: AD | Admit: 2020-01-04 | Payer: BC Managed Care – PPO | Source: Home / Self Care

## 2022-01-25 ENCOUNTER — Other Ambulatory Visit: Payer: Self-pay | Admitting: Obstetrics & Gynecology

## 2022-02-01 ENCOUNTER — Encounter (HOSPITAL_COMMUNITY): Payer: Self-pay | Admitting: Obstetrics & Gynecology

## 2022-02-01 ENCOUNTER — Other Ambulatory Visit: Payer: Self-pay

## 2022-02-01 MED ORDER — POVIDONE-IODINE 10 % EX SWAB: 2.0000 | Freq: Once | CUTANEOUS | Status: DC

## 2022-02-01 NOTE — Progress Notes (Addendum)
Ms Fidler denies chest pain or shortness of breath. Patient denies having any s/s of Covid in her household, also denies any known exposure to Covid.  Ms Stepter informed me that she also takes Phentermine, I  instructed patient to not take any more.  Ms Daley's PCP is with [facility].  Ms Riederer has a Shellfish allergy  I d/ced the betadine nasal swab order and notified Dr. Sallye Ober.  Dr. Sallye Ober  sent me a message stating that she checked with pharmacy, and was told that studies show that a shellfish allergy does not predict an allergy to betadine.

## 2022-02-02 ENCOUNTER — Other Ambulatory Visit: Payer: Self-pay

## 2022-02-02 ENCOUNTER — Ambulatory Visit (HOSPITAL_COMMUNITY): Payer: BC Managed Care – PPO | Admitting: Certified Registered Nurse Anesthetist

## 2022-02-02 ENCOUNTER — Encounter (HOSPITAL_COMMUNITY): Payer: Self-pay | Admitting: Obstetrics & Gynecology

## 2022-02-02 ENCOUNTER — Encounter (HOSPITAL_COMMUNITY)
Admission: RE | Disposition: A | Discharge status: Home / Self Care | Payer: Self-pay | Source: Home / Self Care | Attending: Obstetrics & Gynecology

## 2022-02-02 ENCOUNTER — Ambulatory Visit (HOSPITAL_COMMUNITY)
Admission: RE | Admit: 2022-02-02 | Discharge: 2022-02-02 | Disposition: A | Discharge status: Home / Self Care | Payer: BC Managed Care – PPO | Attending: Obstetrics & Gynecology | Admitting: Obstetrics & Gynecology

## 2022-02-02 DIAGNOSIS — N843 Polyp of vulva: Secondary | ICD-10-CM | POA: Diagnosis not present

## 2022-02-02 DIAGNOSIS — I10 Essential (primary) hypertension: Secondary | ICD-10-CM | POA: Insufficient documentation

## 2022-02-02 DIAGNOSIS — R102 Pelvic and perineal pain: Secondary | ICD-10-CM | POA: Diagnosis present

## 2022-02-02 DIAGNOSIS — N906 Unspecified hypertrophy of vulva: Secondary | ICD-10-CM | POA: Diagnosis not present

## 2022-02-02 DIAGNOSIS — Z6841 Body Mass Index (BMI) 40.0 and over, adult: Secondary | ICD-10-CM | POA: Insufficient documentation

## 2022-02-02 HISTORY — PX: VULVECTOMY: SHX1086

## 2022-02-02 HISTORY — DX: Anemia, unspecified: D64.9

## 2022-02-02 LAB — POCT PREGNANCY, URINE: Preg Test, Ur: NEGATIVE

## 2022-02-02 LAB — CBC
HCT: 37.1 % (ref 36.0–46.0)
Hemoglobin: 11.9 g/dL — ABNORMAL LOW (ref 12.0–15.0)
MCH: 25.3 pg — ABNORMAL LOW (ref 26.0–34.0)
MCHC: 32.1 g/dL (ref 30.0–36.0)
MCV: 78.8 fL — ABNORMAL LOW (ref 80.0–100.0)
Platelets: 255 10*3/uL (ref 150–400)
RBC: 4.71 MIL/uL (ref 3.87–5.11)
RDW: 15.1 % (ref 11.5–15.5)
WBC: 7.3 10*3/uL (ref 4.0–10.5)
nRBC: 0 % (ref 0.0–0.2)

## 2022-02-02 SURGERY — VULVECTOMY
Anesthesia: General

## 2022-02-02 MED ORDER — ACETAMINOPHEN 500 MG PO TABS
1000.0000 mg | ORAL_TABLET | Freq: Once | ORAL | Status: AC
  Administered 2022-02-02: 1000 mg via ORAL
  Filled 2022-02-02: qty 2

## 2022-02-02 MED ORDER — BENZOCAINE-MENTHOL 20-0.5 % EX AERO: 1.0000 | INHALATION_SPRAY | Freq: Four times a day (QID) | CUTANEOUS | 0 refills | Status: DC | PRN

## 2022-02-02 MED ORDER — LIDOCAINE-EPINEPHRINE 1 %-1:100000 IJ SOLN
INTRAMUSCULAR | Status: AC
  Filled 2022-02-02: qty 1

## 2022-02-02 MED ORDER — BACITRACIN ZINC 500 UNIT/GM EX OINT
TOPICAL_OINTMENT | CUTANEOUS | Status: DC | PRN
  Administered 2022-02-02: 1 via TOPICAL

## 2022-02-02 MED ORDER — SUGAMMADEX SODIUM 500 MG/5ML IV SOLN
INTRAVENOUS | Status: AC
  Filled 2022-02-02: qty 5

## 2022-02-02 MED ORDER — ROCURONIUM BROMIDE 10 MG/ML (PF) SYRINGE
PREFILLED_SYRINGE | INTRAVENOUS | Status: DC | PRN
  Administered 2022-02-02: 60 mg via INTRAVENOUS

## 2022-02-02 MED ORDER — IBUPROFEN 800 MG PO TABS: 800.0000 mg | ORAL_TABLET | Freq: Three times a day (TID) | ORAL | 0 refills | Status: DC | PRN

## 2022-02-02 MED ORDER — IBUPROFEN 800 MG PO TABS: 800.0000 mg | ORAL_TABLET | Freq: Three times a day (TID) | ORAL | Status: DC | PRN

## 2022-02-02 MED ORDER — FENTANYL CITRATE (PF) 250 MCG/5ML IJ SOLN
INTRAMUSCULAR | Status: AC
  Filled 2022-02-02: qty 5

## 2022-02-02 MED ORDER — LIDOCAINE-EPINEPHRINE 1 %-1:100000 IJ SOLN
INTRAMUSCULAR | Status: DC | PRN
  Administered 2022-02-02: 20 mL

## 2022-02-02 MED ORDER — KETOROLAC TROMETHAMINE 30 MG/ML IJ SOLN
INTRAMUSCULAR | Status: AC
  Filled 2022-02-02: qty 1

## 2022-02-02 MED ORDER — SUGAMMADEX SODIUM 500 MG/5ML IV SOLN
INTRAVENOUS | Status: DC | PRN
  Administered 2022-02-02: 300 mg via INTRAVENOUS

## 2022-02-02 MED ORDER — CHLORHEXIDINE GLUCONATE 0.12 % MT SOLN
15.0000 mL | Freq: Once | OROMUCOSAL | Status: AC
  Administered 2022-02-02: 15 mL via OROMUCOSAL
  Filled 2022-02-02: qty 15

## 2022-02-02 MED ORDER — PROPOFOL 10 MG/ML IV BOLUS
INTRAVENOUS | Status: DC | PRN
  Administered 2022-02-02: 200 mg via INTRAVENOUS

## 2022-02-02 MED ORDER — LIDOCAINE 2% (20 MG/ML) 5 ML SYRINGE
INTRAMUSCULAR | Status: AC
  Filled 2022-02-02: qty 5

## 2022-02-02 MED ORDER — 0.9 % SODIUM CHLORIDE (POUR BTL) OPTIME
TOPICAL | Status: DC | PRN
  Administered 2022-02-02: 1000 mL

## 2022-02-02 MED ORDER — ORAL CARE MOUTH RINSE: 15.0000 mL | Freq: Once | OROMUCOSAL | Status: AC

## 2022-02-02 MED ORDER — LACTATED RINGERS IV SOLN: INTRAVENOUS | Status: DC

## 2022-02-02 MED ORDER — LIDOCAINE 2% (20 MG/ML) 5 ML SYRINGE
INTRAMUSCULAR | Status: DC | PRN
  Administered 2022-02-02: 60 mg via INTRAVENOUS

## 2022-02-02 MED ORDER — DEXAMETHASONE SODIUM PHOSPHATE 10 MG/ML IJ SOLN
INTRAMUSCULAR | Status: AC
  Filled 2022-02-02: qty 1

## 2022-02-02 MED ORDER — AMISULPRIDE (ANTIEMETIC) 5 MG/2ML IV SOLN
10.0000 mg | Freq: Once | INTRAVENOUS | Status: AC | PRN
  Administered 2022-02-02: 10 mg via INTRAVENOUS

## 2022-02-02 MED ORDER — KETOROLAC TROMETHAMINE 30 MG/ML IJ SOLN
INTRAMUSCULAR | Status: DC | PRN
  Administered 2022-02-02: 30 mg via INTRAVENOUS

## 2022-02-02 MED ORDER — ONDANSETRON HCL 4 MG/2ML IJ SOLN
INTRAMUSCULAR | Status: DC | PRN
  Administered 2022-02-02: 4 mg via INTRAVENOUS

## 2022-02-02 MED ORDER — BACITRACIN ZINC 500 UNIT/GM EX OINT
TOPICAL_OINTMENT | CUTANEOUS | Status: AC
  Filled 2022-02-02: qty 28.35

## 2022-02-02 MED ORDER — OXYCODONE HCL 5 MG PO TABS: 5.0000 mg | ORAL_TABLET | ORAL | 0 refills | Status: DC | PRN

## 2022-02-02 MED ORDER — MIDAZOLAM HCL 2 MG/2ML IJ SOLN
INTRAMUSCULAR | Status: DC | PRN
  Administered 2022-02-02: 2 mg via INTRAVENOUS

## 2022-02-02 MED ORDER — FENTANYL CITRATE (PF) 100 MCG/2ML IJ SOLN: 25.0000 ug | INTRAMUSCULAR | Status: DC | PRN

## 2022-02-02 MED ORDER — ONDANSETRON HCL 4 MG/2ML IJ SOLN
INTRAMUSCULAR | Status: AC
  Filled 2022-02-02: qty 2

## 2022-02-02 MED ORDER — AMISULPRIDE (ANTIEMETIC) 5 MG/2ML IV SOLN
INTRAVENOUS | Status: AC
  Filled 2022-02-02: qty 4

## 2022-02-02 MED ORDER — FENTANYL CITRATE (PF) 250 MCG/5ML IJ SOLN
INTRAMUSCULAR | Status: DC | PRN
  Administered 2022-02-02: 100 ug via INTRAVENOUS

## 2022-02-02 MED ORDER — MIDAZOLAM HCL 2 MG/2ML IJ SOLN
INTRAMUSCULAR | Status: AC
  Filled 2022-02-02: qty 2

## 2022-02-02 MED ORDER — DEXAMETHASONE SODIUM PHOSPHATE 10 MG/ML IJ SOLN
INTRAMUSCULAR | Status: DC | PRN
  Administered 2022-02-02: 10 mg via INTRAVENOUS

## 2022-02-02 SURGICAL SUPPLY — 26 items
BAG COUNTER SPONGE SURGICOUNT (BAG) ×1 IMPLANT
BAG SPNG CNTER NS LX DISP (BAG) ×1
CANISTER SUCT 3000ML PPV (MISCELLANEOUS) ×1 IMPLANT
ELECT REM PT RETURN 9FT ADLT (ELECTROSURGICAL) ×1
ELECTRODE REM PT RTRN 9FT ADLT (ELECTROSURGICAL) IMPLANT
GAUZE SPONGE 4X4 12PLY STRL (GAUZE/BANDAGES/DRESSINGS) IMPLANT
GLOVE BIO SURGEON STRL SZ 6.5 (GLOVE) ×1 IMPLANT
GLOVE BIOGEL PI IND STRL 6.5 (GLOVE) ×1 IMPLANT
GLOVE BIOGEL PI IND STRL 7.0 (GLOVE) ×1 IMPLANT
GOWN STRL REUS W/ TWL LRG LVL3 (GOWN DISPOSABLE) ×2 IMPLANT
GOWN STRL REUS W/TWL LRG LVL3 (GOWN DISPOSABLE) ×2
KIT TURNOVER KIT B (KITS) ×1 IMPLANT
NS IRRIG 1000ML POUR BTL (IV SOLUTION) ×1 IMPLANT
PACK VAGINAL MINOR WOMEN LF (CUSTOM PROCEDURE TRAY) ×1 IMPLANT
PAD OB MATERNITY 4.3X12.25 (PERSONAL CARE ITEMS) ×1 IMPLANT
PENCIL BUTTON HOLSTER BLD 10FT (ELECTRODE) IMPLANT
SUT MERSILENE 5MM BP 1 12 (SUTURE) IMPLANT
SUT PROLENE 1 CT 1 30 (SUTURE) ×1 IMPLANT
SUT SILK 2 0 SH (SUTURE) IMPLANT
SUT VIC AB 4-0 SH 27 (SUTURE) ×2
SUT VIC AB 4-0 SH 27XANBCTRL (SUTURE) IMPLANT
TOWEL GREEN STERILE FF (TOWEL DISPOSABLE) ×2 IMPLANT
TRAY FOLEY W/BAG SLVR 14FR (SET/KITS/TRAYS/PACK) ×1 IMPLANT
TUBE CONNECTING 12X1/4 (SUCTIONS) IMPLANT
UNDERPAD 30X36 HEAVY ABSORB (UNDERPADS AND DIAPERS) ×1 IMPLANT
YANKAUER SUCT BULB TIP NO VENT (SUCTIONS) IMPLANT

## 2022-02-02 NOTE — H&P (Signed)
Rose Manning is an 31 y.o. female with a vulva lesion causing perineal discomfort here for resection of the lesion.  She reports the lesion spontaneously appeared about 2 years while patient was pregnant and never went away.    Pertinent Gynecological History: Menses:  regular Bleeding: None currently Contraception:  Unsure  DES exposure: unknown Blood transfusions: none Sexually transmitted diseases: no past history Previous GYN Procedures:  None   Last mammogram: N/A Last pap: normal  OB History: [id number]    Menstrual History: Patient's last menstrual period was 01/27/2022.    Past Medical History:  Diagnosis Date   Anemia    Anxiety    Depression    History of gestational hypertension    History of kidney stones    Hx of varicella    Migraine    Obesity    Pregnancy induced hypertension    Thyroid cyst     Past Surgical History:  Procedure Laterality Date   CESAREAN SECTION N/A 12/28/2019   Procedure: CESAREAN SECTION;  Surgeon: Jaymes Graff, MD;  Location: [facility];  Service: Obstetrics;  Laterality: N/A;   CHOLECYSTECTOMY  01/2009   DILATION AND CURETTAGE OF UTERUS  2019   D&E   DILATION AND EVACUATION Bilateral 08/14/2017   Procedure: Suction DILATATION AND EVACUATION;  Surgeon: Hoover Browns, MD;  Location: [facility];  Service: Gynecology;  Laterality: Bilateral;   WISDOM TOOTH EXTRACTION      Family History  Problem Relation Age of Onset   Hypertension Mother    Urolithiasis Mother    Migraines Mother    Hypertension Father    Hypertension Maternal Grandmother    Hypertension Maternal Grandfather    Alzheimer's disease Maternal Grandfather    Hypertension Paternal Grandmother    Hypertension Paternal Grandfather    Heart disease Paternal Grandfather    Lupus Maternal Aunt    Rheum arthritis Maternal Aunt    Diabetes Cousin    Anesthesia problems Neg Hx     Social History:  reports that she has never smoked. She has never used smokeless tobacco. She  reports that she does not drink alcohol and does not use drugs.  Allergies:  Allergies  Allergen Reactions   Shellfish Allergy Anaphylaxis    Pt states was taken to ED and given epipen    Medications Prior to Admission  Medication Sig Dispense Refill Last Dose   EPINEPHrine (EPIPEN) 0.3 mg/0.3 mL SOAJ injection Inject 0.3 mLs (0.3 mg total) into the muscle as needed. 2 Device 0    NON FORMULARY Take 15 mg by mouth daily. Phentremine   01/31/2022   Vitamin D, Ergocalciferol, (DRISDOL) 1.25 MG (50000 UNIT) CAPS capsule Take 50,000 Units by mouth every Wednesday.   Past Week    Review of Systems Constitutional: Denies fevers/chills Cardiovascular: Denies chest pain or palpitations Pulmonary: Denies coughing or wheezing Gastrointestinal: Denies nausea, vomiting or diarrhea Genitourinary: With perineal discomfort, denies unusual vaginal bleeding, unusual vaginal discharge, dysuria, urgency or frequency.  Musculoskeletal: Denies muscle or joint aches and pain.  Neurology: Denies abnormal sensations such as tingling or numbness.   Blood pressure 136/78, pulse 66, temperature 98.1 F (36.7 C), resp. rate 17, height 5\' 5"  (1.651 m), weight (!) 149.7 kg, last menstrual period 01/27/2022, SpO2 100 %, unknown if currently breastfeeding. BMI 54  Physical Exam  Vitals reviewed. Constitutional: She is oriented to person, place, and time. She appears well-developed and well-nourished.  HENT:  Head: Normocephalic and atraumatic.  Eyes: Conjunctivae and EOM are normal.  Neck: Normal range of motion. Neck supple.  Cardiovascular: Normal rate, regular rhythm, normal heart sounds.  Respiratory: Effort normal and breath sounds normal.  GI: Soft. She exhibits no mass. There is no tenderness.  Genitourinary: 4 x 2 cm skin colored protuberant and linear lesion, soft, non tender to touch. Normal vagina, adnexa and uterus exam.   Musculoskeletal: Normal range of motion.  Neurological: She is alert and  oriented to person, place, and time.  Skin: Skin is warm and dry.  Psychiatric: She has a normal mood and affect. Judgment normal.    Results for orders placed or performed during the hospital encounter of 02/02/22 (from the past 24 hour(s))  Pregnancy, urine POC     Status: None   Collection Time: 02/02/22  9:18 AM  Result Value Ref Range   Preg Test, Ur NEGATIVE NEGATIVE  CBC per protocol     Status: Abnormal   Collection Time: 02/02/22  9:22 AM  Result Value Ref Range   WBC 7.3 4.0 - 10.5 K/uL   RBC 4.71 3.87 - 5.11 MIL/uL   Hemoglobin 11.9 (L) 12.0 - 15.0 g/dL   HCT 77.8 24.2 - 35.3 %   MCV 78.8 (L) 80.0 - 100.0 fL   MCH 25.3 (L) 26.0 - 34.0 pg   MCHC 32.1 30.0 - 36.0 g/dL   RDW 61.4 43.1 - 54.0 %   Platelets 255 150 - 400 K/uL   nRBC 0.0 0.0 - 0.2 %   Assessment/Plan:  31 y/o female with vulva lesion causing discomfort, here for resection of the lesion,  - This procedure has been fully reviewed with the patient and written informed consent has been obtained.  - We discussed risks of the procedure to include but not limited to risks of bleeding, infection, damage to organs, scarring and painful intercourse.  All her questions were answered and she expressed understanding.  - Admit to [facility]. - NPO and IV fluids Prescilla Sours, MD.  02/02/2022, 10:19 AM

## 2022-02-02 NOTE — Discharge Instructions (Addendum)
  Aireana, 1. Nothing in vagina x 2 weeks: no sex, no tampons, no douching x 2 weeks.  2. Expect some vulva bleeding for next several days, call me if with excessive bleeding requiring you to change a pad every hour.  3.  Expect some soreness over the vulva where the procedure was done. Use dermaplast spray over the area as needed, and you may also use over counter tylenol, prescribed ibuprofen and oxycodone as needed. Call me if the pain is intolerable despite the medication use. 4. Please apply bacitracin ointment (provided at the hospital) or vaseline petroleum jelly over the affected area three times a day before spraying with dermaplast spray.  5. You may take sitz baths for 10 minutes or so using plain warm water once a day for the next two weeks to help with the healing.  6. You may use peri pad ice packs twice a day for the one week. You may also use regular peri pads as needed for any vulva bleeding noted.   I wish you a quick recovery, Dr. Sallye Ober.   Office phone number: [phone]. [phone].

## 2022-02-02 NOTE — Transfer of Care (Signed)
Immediate Anesthesia Transfer of Care Note  Patient: Rose Manning  Procedure(s) Performed: VULVECTOMY  Patient Location: PACU  Anesthesia Type:General  Level of Consciousness: awake, alert  and oriented  Airway & Oxygen Therapy: Patient Spontanous Breathing and Patient connected to face mask oxygen  Post-op Assessment: Report given to RN and Post -op Vital signs reviewed and stable  Post vital signs: Reviewed and stable  Last Vitals:  Vitals Value Taken Time  BP 112/74   Temp    Pulse 78   Resp 14   SpO2 99%     Last Pain:  Vitals:   02/02/22 0847  PainSc: 0-No pain         Complications: No notable events documented.

## 2022-02-02 NOTE — Anesthesia Preprocedure Evaluation (Signed)
Anesthesia Evaluation  Patient identified by MRN, date of birth, ID band Patient awake    Reviewed: Allergy & Precautions, NPO status , Patient's Chart, lab work & pertinent test results  Airway Mallampati: III  TM Distance: >3 FB Neck ROM: Full    Dental   Pulmonary neg pulmonary ROS,    breath sounds clear to auscultation       Cardiovascular hypertension, Pt. on medications  Rhythm:Regular Rate:Normal     Neuro/Psych  Headaches,    GI/Hepatic negative GI ROS, Neg liver ROS,   Endo/Other  Morbid obesity  Renal/GU negative Renal ROS     Musculoskeletal   Abdominal   Peds  Hematology negative hematology ROS (+)   Anesthesia Other Findings   Reproductive/Obstetrics                             Lab Results  Component Value Date   WBC 11.8 (H) 12/29/2019   HGB 7.4 (L) 12/29/2019   HCT 24.4 (L) 12/29/2019   MCV 75.8 (L) 12/29/2019   PLT 168 12/29/2019   Lab Results  Component Value Date   CREATININE 0.84 01/04/2018   BUN 10 01/04/2018   NA 140 01/04/2018   K 3.6 01/04/2018   CL 108 01/04/2018   CO2 24 01/04/2018    Anesthesia Physical Anesthesia Plan  ASA: 3  Anesthesia Plan: General   Post-op Pain Management: Tylenol PO (pre-op)* and Toradol IV (intra-op)*   Induction: Intravenous  PONV Risk Score and Plan: 3 and Dexamethasone, Ondansetron, Midazolam and Treatment may vary due to age or medical condition  Airway Management Planned: Oral ETT  Additional Equipment:   Intra-op Plan:   Post-operative Plan: Extubation in OR  Informed Consent: I have reviewed the patients History and Physical, chart, labs and discussed the procedure including the risks, benefits and alternatives for the proposed anesthesia with the patient or authorized representative who has indicated his/her understanding and acceptance.     Dental advisory given  Plan Discussed with:  CRNA  Anesthesia Plan Comments:         Anesthesia Quick Evaluation

## 2022-02-02 NOTE — Op Note (Signed)
  Patient: Rose Manning Date of birth: 1991/01/01 [id number] Date of procedure: 02/02/2022  Preop diagnosis:   Enlarged labia majora.  Perineal pain.   Postop diagnosis: Same as above.    Procedure:  1.Simple partial vulvectomy.   Surgeon: Dr. Hoover Browns.  Assistant: None  Anesthesia: General ETA (Dr. Marcene Duos) and local anesthesia by surgeon.   Complications: None  Input:  800 cc LR  EBL: 10 cc    Urine: 100 cc straight catheterization.   Findings: Enlarged labia majora with 4 x 2 cm fleshy protuberance on lower aspect of left and right labia majora.    Indications:  31 year-old Para 2 with enlarged labia majora with protuberance causing perineal discomfort who desires removal of the affected area.    Procedures: Informed consent was obtained from the patient to undergo the procedure.  She was taken to the operating room where anesthesia was administered. She was placed in dorsal lithotomy position and vagina and perineum was prepped with betadine. The protuberant area on the labia majora  was grasped with allis clamps. 20 cc of 1% Lidocaine with epinephrine was instilled around the area. The skin and subcutaneous layers of the area were then excised in their entirety and with 3 mm margins. Bovie was used to enhance hemostasis on the remaining defect. This was then closed off with 4-0 vicryl in interrupted stitches.  Pressure was applied and excellent hemostasis was then noted over the area. The area was then cleaned and bacitracin ointment applied over the area.  All instruments were then removed.  She was then awoken from anesthesia and taken to the recovery room in stable condition.          Specimens:  Vulva tissue.    Dr. Hoover Browns, MD.  02/02/2022.

## 2022-02-02 NOTE — Interval H&P Note (Signed)
History and Physical Interval Note:  02/02/2022 10:35 AM  Rose Manning  has presented today for surgery, with the diagnosis of NON INFLAMMATORY DISORDERS OF THE VAGINA.  The various methods of treatment have been discussed with the patient and family. After consideration of risks, benefits and other options for treatment, the patient has consented to  Procedure(s): VULVECTOMY (N/A) - RESECTION OF VULVA LESION as a surgical intervention.  The patient's history has been reviewed, patient examined, no change in status, stable for surgery.  I have reviewed the patient's chart and labs.  Questions were answered to the patient's satisfaction.     Prescilla Sours, MD.

## 2022-02-02 NOTE — Anesthesia Procedure Notes (Signed)
Procedure Name: Intubation Date/Time: 02/02/2022 11:10 AM  Performed by: Pearson Grippe, CRNAPre-anesthesia Checklist: Patient identified, Emergency Drugs available, Suction available and Patient being monitored Patient Re-evaluated:Patient Re-evaluated prior to induction Oxygen Delivery Method: Circle system utilized Preoxygenation: Pre-oxygenation with 100% oxygen Induction Type: IV induction Ventilation: Mask ventilation without difficulty Laryngoscope Size: Miller and 2 Grade View: Grade I Tube type: Oral Tube size: 7.0 mm Number of attempts: 1 Airway Equipment and Method: Stylet Placement Confirmation: ETT inserted through vocal cords under direct vision, positive ETCO2 and breath sounds checked- equal and bilateral Secured at: 21 cm Tube secured with: Tape Dental Injury: Teeth and Oropharynx as per pre-operative assessment

## 2022-02-05 ENCOUNTER — Emergency Department (HOSPITAL_COMMUNITY)
Admission: EM | Admit: 2022-02-05 | Discharge: 2022-02-05 | Disposition: A | Discharge status: Home / Self Care | Payer: BC Managed Care – PPO | Attending: Emergency Medicine | Admitting: Emergency Medicine

## 2022-02-05 ENCOUNTER — Other Ambulatory Visit: Payer: Self-pay

## 2022-02-05 ENCOUNTER — Encounter (HOSPITAL_COMMUNITY): Payer: Self-pay

## 2022-02-05 DIAGNOSIS — R0789 Other chest pain: Secondary | ICD-10-CM | POA: Diagnosis not present

## 2022-02-05 DIAGNOSIS — D72829 Elevated white blood cell count, unspecified: Secondary | ICD-10-CM | POA: Diagnosis not present

## 2022-02-05 DIAGNOSIS — R410 Disorientation, unspecified: Secondary | ICD-10-CM | POA: Insufficient documentation

## 2022-02-05 DIAGNOSIS — M542 Cervicalgia: Secondary | ICD-10-CM | POA: Diagnosis not present

## 2022-02-05 DIAGNOSIS — R55 Syncope and collapse: Secondary | ICD-10-CM | POA: Diagnosis present

## 2022-02-05 LAB — CBC WITH DIFFERENTIAL/PLATELET
Abs Immature Granulocytes: 0.06 10*3/uL (ref 0.00–0.07)
Basophils Absolute: 0.1 10*3/uL (ref 0.0–0.1)
Basophils Relative: 1 %
Eosinophils Absolute: 0.2 10*3/uL (ref 0.0–0.5)
Eosinophils Relative: 2 %
HCT: 39 % (ref 36.0–46.0)
Hemoglobin: 12.1 g/dL (ref 12.0–15.0)
Immature Granulocytes: 1 %
Lymphocytes Relative: 29 %
Lymphs Abs: 3.1 10*3/uL (ref 0.7–4.0)
MCH: 25.4 pg — ABNORMAL LOW (ref 26.0–34.0)
MCHC: 31 g/dL (ref 30.0–36.0)
MCV: 81.9 fL (ref 80.0–100.0)
Monocytes Absolute: 0.6 10*3/uL (ref 0.1–1.0)
Monocytes Relative: 6 %
Neutro Abs: 6.6 10*3/uL (ref 1.7–7.7)
Neutrophils Relative %: 61 %
Platelets: 260 10*3/uL (ref 150–400)
RBC: 4.76 MIL/uL (ref 3.87–5.11)
RDW: 15.3 % (ref 11.5–15.5)
WBC: 10.7 10*3/uL — ABNORMAL HIGH (ref 4.0–10.5)
nRBC: 0 % (ref 0.0–0.2)

## 2022-02-05 LAB — TROPONIN I (HIGH SENSITIVITY): Troponin I (High Sensitivity): 2 ng/L (ref <18)

## 2022-02-05 LAB — I-STAT BETA HCG BLOOD, ED (MC, WL, AP ONLY): I-stat hCG, quantitative: <5 m[IU]/mL (ref <5)

## 2022-02-05 LAB — URINALYSIS, ROUTINE W REFLEX MICROSCOPIC
Bilirubin Urine: NEGATIVE
Glucose, UA: NEGATIVE mg/dL
Ketones, ur: NEGATIVE mg/dL
Nitrite: NEGATIVE
Protein, ur: NEGATIVE mg/dL
RBC / HPF: >50 RBC/hpf — ABNORMAL HIGH (ref 0–5)
Specific Gravity, Urine: 1.024 (ref 1.005–1.030)
Squamous Epithelial / HPF: >50 — ABNORMAL HIGH (ref 0–5)
pH: 5 (ref 5.0–8.0)

## 2022-02-05 LAB — BASIC METABOLIC PANEL
Anion gap: 7 (ref 5–15)
BUN: 15 mg/dL (ref 6–20)
CO2: 27 mmol/L (ref 22–32)
Calcium: 9.1 mg/dL (ref 8.9–10.3)
Chloride: 111 mmol/L (ref 98–111)
Creatinine, Ser: 0.85 mg/dL (ref 0.44–1.00)
GFR, Estimated: >60 mL/min (ref >60)
Glucose, Bld: 102 mg/dL — ABNORMAL HIGH (ref 70–99)
Potassium: 4.1 mmol/L (ref 3.5–5.1)
Sodium: 145 mmol/L (ref 135–145)

## 2022-02-05 LAB — D-DIMER, QUANTITATIVE: D-Dimer, Quant: 0.32 ug/mL-FEU (ref 0.00–0.50)

## 2022-02-05 LAB — SURGICAL PATHOLOGY

## 2022-02-05 NOTE — ED Triage Notes (Signed)
Ambulatory to ED with c/o syncopal episode and chest pains. States she woke up laying on the floor earlier this evening and is now having chest pains. Recently had gyn surgery on Friday under general anesthesia.

## 2022-02-05 NOTE — ED Provider Notes (Signed)
[facility] DEPT Provider Note   CSN: [id number] Arrival date & time: 02/05/22  0100     History  Chief Complaint  Patient presents with   Loss of Consciousness    Rose Manning is a 31 y.o. female.  HPI    Patient without significant medical history presents with complaints of syncope.  Patient states that starting this morning when she woke up she had some right-sided chest pain as well as some neck pain, she states that chest pain has remained constant, does not radiate, no shortness of breath or pleuritic chest pain, she states that she took a shower started feeling lightheaded got out of the shower and went to her bed and then woke up on the floor, she states that when she woke up she was slightly confused and why she was on the ground, she denies become incontinent, did not bite her tongue, no history of seizure disorders, she has never had this in the past.  She has no cardiac history no history of PEs or DVTs currently not on hormone therapy, she did recently have surgery on Friday under general anesthesia.  She denies any headaches no change in vision no numbness or tingling arms or legs not endorsing any neck pain back pain abdominal pain still passing gas having normal bowel movements.  Patient underwent general anesthesia on Friday Simple partial vulvectomy to remove a lesion.  Uncomplicated and was discharged home.  Currently taking oxycodone for pain.   Home Medications Prior to Admission medications   Medication Sig Start Date End Date Taking? Authorizing Provider  benzocaine-Menthol (DERMOPLAST) 20-0.5 % AERO Apply 1 Application topically 4 (four) times daily as needed for irritation. 02/02/22   Waymon Amato, MD  EPINEPHrine (EPIPEN) 0.3 mg/0.3 mL SOAJ injection Inject 0.3 mLs (0.3 mg total) into the muscle as needed. 05/25/13   Rolland Porter, MD  ibuprofen (ADVIL) 800 MG tablet Take 1 tablet (800 mg total) by mouth every 8 (eight) hours as  needed for moderate pain or cramping. 02/03/22   Waymon Amato, MD  NON FORMULARY Take 15 mg by mouth daily. Phentremine    [provider]  oxyCODONE (ROXICODONE) 5 MG immediate release tablet Take 1 tablet (5 mg total) by mouth every 4 (four) hours as needed for severe pain. 02/02/22   Waymon Amato, MD  Vitamin D, Ergocalciferol, (DRISDOL) 1.25 MG (50000 UNIT) CAPS capsule Take 50,000 Units by mouth every Wednesday. 12/14/21   [provider]      Allergies    Shellfish allergy    Review of Systems   Review of Systems  Constitutional:  Negative for chills and fever.  Respiratory:  Negative for shortness of breath.   Cardiovascular:  Positive for chest pain.  Gastrointestinal:  Negative for abdominal pain.  Neurological:  Positive for syncope. Negative for headaches.    Physical Exam Updated Vital Signs BP (!) 115/59   Pulse 64   Temp 98.2 F (36.8 C) (Oral)   Resp 20   Ht 5\' 5"  (1.651 m)   Wt (!) 151 kg   LMP 01/27/2022   SpO2 98%   BMI 55.41 kg/m  Physical Exam Vitals and nursing note reviewed.  Constitutional:      General: She is not in acute distress.    Appearance: She is not ill-appearing.  HENT:     Head: Normocephalic and atraumatic.     Comments: There is no deformity of the head present no raccoon eyes or battle sign noted.  Nose: No congestion.     Mouth/Throat:     Mouth: Mucous membranes are moist.     Pharynx: Oropharynx is clear.     Comments: No trismus no torticollis no oral trauma. Eyes:     Extraocular Movements: Extraocular movements intact.     Conjunctiva/sclera: Conjunctivae normal.     Pupils: Pupils are equal, round, and reactive to light.  Cardiovascular:     Rate and Rhythm: Normal rate and regular rhythm.     Pulses: Normal pulses.     Heart sounds: No murmur heard.    No friction rub. No gallop.  Pulmonary:     Effort: No respiratory distress.     Breath sounds: No wheezing, rhonchi or rales.  Abdominal:      Palpations: Abdomen is soft.     Tenderness: There is no abdominal tenderness. There is no right CVA tenderness or left CVA tenderness.  Musculoskeletal:     Comments: Moving all 4 extremities without difficulty.  Skin:    General: Skin is warm and dry.  Neurological:     Mental Status: She is alert.     GCS: GCS eye subscore is 4. GCS verbal subscore is 5. GCS motor subscore is 6.     Cranial Nerves: Cranial nerves 2-12 are intact.     Sensory: Sensation is intact.     Motor: No weakness.     Coordination: Romberg sign negative. Finger-Nose-Finger Test normal.     Comments: No facial asymmetry no difficulty word finding following two-step commands no real weakness present.  Psychiatric:        Mood and Affect: Mood normal.     ED Results / Procedures / Treatments   Labs (all labs ordered are listed, but only abnormal results are displayed) Labs Reviewed  BASIC METABOLIC PANEL - Abnormal; Notable for the following components:      Result Value   Glucose, Bld 102 (*)    All other components within normal limits  URINALYSIS, ROUTINE W REFLEX MICROSCOPIC - Abnormal; Notable for the following components:   APPearance CLOUDY (*)    Hgb urine dipstick MODERATE (*)    Leukocytes,Ua MODERATE (*)    RBC / HPF >50 (*)    Bacteria, UA RARE (*)    Squamous Epithelial / LPF >50 (*)    All other components within normal limits  CBC WITH DIFFERENTIAL/PLATELET - Abnormal; Notable for the following components:   WBC 10.7 (*)    MCH 25.4 (*)    All other components within normal limits  D-DIMER, QUANTITATIVE  I-STAT BETA HCG BLOOD, ED ([facility], [facility], [facility] ONLY)  TROPONIN I (HIGH SENSITIVITY)    EKG EKG Interpretation  Date/Time:  Monday February 05 2022 01:07:46 EDT Ventricular Rate:  77 PR Interval:  146 QRS Duration: 93 QT Interval:  376 QTC Calculation: 426 R Axis:   -11 Text Interpretation: Sinus rhythm No significant change was found Confirmed by Molpus, John [phone]) on 02/05/2022  1:26:56 AM  Radiology No results found.  Procedures Procedures    Medications Ordered in ED Medications - No data to display  ED Course/ Medical Decision Making/ A&P                           Medical Decision Making Amount and/or Complexity of Data Reviewed Labs: ordered.   This patient presents to the ED for concern of syncope, this involves an extensive number of treatment options, and is a  complaint that carries with it a high risk of complications and morbidity.  The differential diagnosis includes PE, arrhythmia, seizures,    Additional history obtained:  Additional history obtained from N/A External records from outside source obtained and reviewed including surgical notes   Co morbidities that complicate the patient evaluation  N/A  Social Determinants of Health:  N/A    Lab Tests:  I Ordered, and personally interpreted labs.  The pertinent results include: UA shows leukocytes red blood cells white blood cells rare bacteria many squamous cells, CBC shows slight leukocytosis 10.7, BMP shows glucose of 102, D-dimer negative, troponin negative   Imaging Studies ordered:  I ordered imaging studies including N/A I independently visualized and interpreted imaging which showed N/A I agree with the radiologist interpretation   Cardiac Monitoring:  The patient was maintained on a cardiac monitor.  I personally viewed and interpreted the cardiac monitored which showed an underlying rhythm of: Without signs of ischemia   Medicines ordered and prescription drug management:  I ordered medication including N/A I have reviewed the patients home medicines and have made adjustments as needed  Critical Interventions:  N/A   Reevaluation:  Presents with syncope, unclear etiology, will obtain lab work and reassess.  Patient was reassessed she was found sleeping, vital signs have remained stable, patient has no complaints at this time, she is agreement plan  discharge at this time.    Consultations Obtained:  N/A   Test Considered:  CTA of chest-with shared decision making this will be deferred, as my clinical suspicion for PE is low at this time, patient is not endorsing pleuritic chest pain or shortness of breath, no stabbing-like pain, there is been no long immobilization, she did have surgery but she was discharged that day and has not been bedbound, she has no history of PEs or DVTs, vital signs are reassuring she is nontachypneic nonhypoxic, D-dimer is negative, there is no elevation in troponins, using Wells criteria she has a low risk factor, this was also discussed with patient and she is also agreement with this and like to follow-up with her PCP as needed.    Rule out low suspicion for CVA or intracranial head bleed as patient denies change in vision, paresthesias or weakness to upper lower extremities, no neuro deficits noted on exam,  Low suspicion for ACS or arrhythmias EKG without signs of ischemia, first troponin is negative, second troponin was deferred as patient is having chest pain greater than 12 hours if ACS was present would expect elevation in troponins at this time.  I have low suspicion for seizures presentation is atypical, no postictal state, no urinary incontinency, no tongue biting, presentation is atypical.  I have low suspicion for UTI she is not endorsing any urinary symptoms, UA slightly abnormal but I suspect this is secondary due to contamination as she has greater than 50 squamous cells present.  Low suspicion for systemic infection as patient is nontoxic-appearing, vital signs reassuring, no obvious source infection noted on exam.       Dispostion and problem list  After consideration of the diagnostic results and the patients response to treatment, I feel that the patent would benefit from discharge.  Atypical chest pain-unclear etiology bisected more likely muscular, will have her continue with home  medications and follow PCP as needed. Syncope-likely vasovagal, will have her stay hydrated, and follow-up with PCP as needed strict return precautions.            Final Clinical Impression(s) /  ED Diagnoses Final diagnoses:  Syncope and collapse  Atypical chest pain    Rx / DC Orders ED Discharge Orders     None         Marcello Fennel, PA-C 02/05/22 0359    Molpus, Jenny Reichmann, MD 02/05/22 [phone]

## 2022-02-05 NOTE — Discharge Instructions (Signed)
Lab work was reassuring, please stay hydrated as I suspect you might have been slightly dehydrated causing your blood pressure drop and faint.    Chest pain may use over-the-counter pain medications as needed, if you develop worsening chest pain you become short of breath, hurts to breathe, you have another fainting episode or your symptoms are simply worsening I like you to come back for further evaluation.  Come back to the emergency department if you develop chest pain, shortness of breath, severe abdominal pain, uncontrolled nausea, vomiting, diarrhea.

## 2022-02-05 NOTE — Anesthesia Postprocedure Evaluation (Signed)
Anesthesia Post Note  Patient: Rose Manning  Procedure(s) Performed: VULVECTOMY     Patient location during evaluation: PACU Anesthesia Type: General Level of consciousness: awake and alert Pain management: pain level controlled Vital Signs Assessment: post-procedure vital signs reviewed and stable Respiratory status: spontaneous breathing, nonlabored ventilation, respiratory function stable and patient connected to nasal cannula oxygen Cardiovascular status: blood pressure returned to baseline and stable Postop Assessment: no apparent nausea or vomiting Anesthetic complications: no   No notable events documented.  Last Vitals:  Vitals:   02/02/22 1230 02/02/22 1245  BP: (!) 142/82 130/65  Pulse: 74 80  Resp: 16 18  Temp:  (!) 36.2 C  SpO2: 98% 96%    Last Pain:  Vitals:   02/02/22 1245  PainSc: 0-No pain                 Tiajuana Amass

## 2022-06-07 DIAGNOSIS — Z9884 Bariatric surgery status: Secondary | ICD-10-CM | POA: Insufficient documentation

## 2022-12-06 DIAGNOSIS — B009 Herpesviral infection, unspecified: Secondary | ICD-10-CM | POA: Insufficient documentation

## 2023-01-15 ENCOUNTER — Other Ambulatory Visit: Payer: Self-pay | Admitting: Obstetrics and Gynecology

## 2023-09-23 LAB — OB RESULTS CONSOLE RPR: RPR: NONREACTIVE

## 2023-09-23 LAB — OB RESULTS CONSOLE GC/CHLAMYDIA
Chlamydia: NEGATIVE
Neisseria Gonorrhea: NEGATIVE

## 2023-09-23 LAB — OB RESULTS CONSOLE HIV ANTIBODY (ROUTINE TESTING): HIV: NONREACTIVE

## 2023-09-23 LAB — OB RESULTS CONSOLE HEPATITIS B SURFACE ANTIGEN: Hepatitis B Surface Ag: NEGATIVE

## 2023-09-23 LAB — OB RESULTS CONSOLE ANTIBODY SCREEN: Antibody Screen: NEGATIVE

## 2023-09-23 LAB — HEPATITIS C ANTIBODY: HCV Ab: NEGATIVE

## 2023-09-23 LAB — OB RESULTS CONSOLE RUBELLA ANTIBODY, IGM: Rubella: IMMUNE

## 2023-09-25 DIAGNOSIS — E538 Deficiency of other specified B group vitamins: Secondary | ICD-10-CM | POA: Insufficient documentation

## 2023-10-29 ENCOUNTER — Other Ambulatory Visit: Payer: Self-pay | Admitting: Obstetrics and Gynecology

## 2023-10-29 DIAGNOSIS — O99213 Obesity complicating pregnancy, third trimester: Secondary | ICD-10-CM

## 2023-11-26 DIAGNOSIS — Z6841 Body Mass Index (BMI) 40.0 and over, adult: Secondary | ICD-10-CM | POA: Insufficient documentation

## 2023-12-06 ENCOUNTER — Ambulatory Visit: Attending: Obstetrics and Gynecology

## 2023-12-06 ENCOUNTER — Ambulatory Visit: Admitting: Obstetrics and Gynecology

## 2023-12-06 ENCOUNTER — Other Ambulatory Visit: Payer: Self-pay | Admitting: *Deleted

## 2023-12-06 VITALS — BP 134/83 | HR 75

## 2023-12-06 DIAGNOSIS — O26892 Other specified pregnancy related conditions, second trimester: Secondary | ICD-10-CM

## 2023-12-06 DIAGNOSIS — O99213 Obesity complicating pregnancy, third trimester: Secondary | ICD-10-CM | POA: Insufficient documentation

## 2023-12-06 DIAGNOSIS — O99212 Obesity complicating pregnancy, second trimester: Secondary | ICD-10-CM

## 2023-12-06 DIAGNOSIS — Z8759 Personal history of other complications of pregnancy, childbirth and the puerperium: Secondary | ICD-10-CM | POA: Insufficient documentation

## 2023-12-06 DIAGNOSIS — O34219 Maternal care for unspecified type scar from previous cesarean delivery: Secondary | ICD-10-CM

## 2023-12-06 DIAGNOSIS — O283 Abnormal ultrasonic finding on antenatal screening of mother: Secondary | ICD-10-CM

## 2023-12-06 DIAGNOSIS — Z9884 Bariatric surgery status: Secondary | ICD-10-CM | POA: Diagnosis present

## 2023-12-06 DIAGNOSIS — O09292 Supervision of pregnancy with other poor reproductive or obstetric history, second trimester: Secondary | ICD-10-CM

## 2023-12-06 DIAGNOSIS — E669 Obesity, unspecified: Secondary | ICD-10-CM | POA: Diagnosis not present

## 2023-12-06 DIAGNOSIS — R030 Elevated blood-pressure reading, without diagnosis of hypertension: Secondary | ICD-10-CM

## 2023-12-06 DIAGNOSIS — Z6841 Body Mass Index (BMI) 40.0 and over, adult: Secondary | ICD-10-CM | POA: Diagnosis present

## 2023-12-06 DIAGNOSIS — Z3A21 21 weeks gestation of pregnancy: Secondary | ICD-10-CM | POA: Diagnosis present

## 2023-12-06 DIAGNOSIS — O09522 Supervision of elderly multigravida, second trimester: Secondary | ICD-10-CM

## 2023-12-06 DIAGNOSIS — O99842 Bariatric surgery status complicating pregnancy, second trimester: Secondary | ICD-10-CM

## 2023-12-06 NOTE — Progress Notes (Signed)
 Maternal-Fetal Medicine Consultation Name: Rose Manning MRN: [id number]  G7 [id number] at 21w 6d gestation.  Patient is here for fetal anatomy scan. -Advanced maternal age.  On cell-free fetal DNA screening, the risks of fetal aneuploidies are not increased. - History of bariatric surgery (2021).  Pregravid BMI 37. History of cesarean delivery.  Obstetrical history significant for a term vaginal delivery followed by a term cesarean delivery (patient's request). - History of shoulder dystocia.  In her first pregnancy, her delivery was complicated by shoulder dystocia but no neurological injuries. - History of gestational hypertension.  Ultrasound We performed fetal anatomy scan. No makers of aneuploidies or fetal structural defects are seen. Fetal biometry is consistent with her previously-established dates. Amniotic fluid is normal and good fetal activity is seen.  Placenta is anterior and there is no evidence of previa or placenta accreta spectrum. Patient understands the limitations of ultrasound in detecting fetal anomalies.  As maternal obesity imposes limitations on the resolution of images, fetal anomalies may be missed.  Our concerns include: Echogenic intracardiac focus (EIF) EIF is present in about 3% to 4% of normal fetuses and in some fetuses with Down syndrome.  Given that she has low risk for fetal Down syndrome on cell-free fetal DNA screening, this should be considered a normal variant.  I did not recommend amniocentesis for this finding.  I counseled the patient that cell free fetal DNA screening has a greater detection rate for Down syndrome. However, only amniocentesis will give a definitive result on the fetal karyotype.  I explained amniocentesis procedure and possible complication of miscarriage (1 and 500 procedures). Patient opted not to have amniocentesis.  History of bariatric surgery (11/15/20) -Bariatric surgery improves pregnancy outcomes.  The likelihood of  gestational diabetes, preeclampsia, chorioamnionitis, fetal macrosomia and NICU admission rates are all decreased. -History of bariatric surgery seems to increase the risk of having small for gestational age neonates.   -Nutritional deficiencies can occur.  Recent vitamin B-12 levels are within normal range.  Vitamin D levels are low. - I informed the patient that she should not attempt to reduce weight during pregnancy and the pregnancy weight gain is expected. -Bowel obstruction: Several reports of published the complication of bowel obstruction following bariatric surgery.  It is more commonly seen with our RYGB procedure, which was performed in outpatient.  Any new symptoms of severe abdominal pain should raise the possibility of a bowel obstruction and the patient should be evaluated.  Majority of bowel obstructions occur within 2 years of surgery.  Previous cesarean delivery I discussed the benefits and risks of VBAC.  The risk of uterine scar dehiscence is about 1%.  Cesarean deliveries increase the risks of hemorrhage, infection and venous thromboembolism.  Long-term complications include small bowel obstruction.  Repeat cesarean deliveries increase the risks of placenta previa and/or placenta accreta spectrum.  Patient will discuss with her provider about mode of delivery.  History of shoulder dystocia In patient with history of shoulder dystocia, recurrence risk is about 10% to 15%.  It is likely to be higher because some patients choose to have elective cesarean delivery.  I counseled the patient about the recurrence risks.  Shoulder dystocia with neurological injuries higher if her current pregnancy is complicated by macrosomia and gestational diabetes.  Shoulder dystocia can occur even in the absence of macrosomia and gestational diabetes.  Patient is keen on cesarean delivery but will discuss with you about mode of delivery.  History of gestational hypertension/preeclampsia I  discussed the benefit  of low-dose aspirin prophylaxis that delays or prevents preeclampsia.  Patient is not on aspirin prophylaxis.  I encouraged her to take aspirin 81 mg daily till delivery.   Recommendations -An appointment was made for her to return in 8 weeks for fetal growth assessment. - If patient prefers ultrasound to be performed at your office, she may cancel her appointment. -Aspirin 81 milligrams daily till delivery.  Consultation including face-to-face (more than 50%) counseling 45 minutes.

## 2024-01-17 ENCOUNTER — Ambulatory Visit

## 2024-02-03 ENCOUNTER — Other Ambulatory Visit: Payer: Self-pay | Admitting: Obstetrics and Gynecology

## 2024-03-03 ENCOUNTER — Other Ambulatory Visit (HOSPITAL_COMMUNITY): Payer: Self-pay | Admitting: Obstetrics and Gynecology

## 2024-03-03 ENCOUNTER — Telehealth (HOSPITAL_COMMUNITY): Payer: Self-pay | Admitting: Pharmacy Technician

## 2024-03-03 ENCOUNTER — Encounter: Payer: Self-pay | Admitting: Obstetrics and Gynecology

## 2024-03-03 ENCOUNTER — Encounter (HOSPITAL_COMMUNITY): Payer: Self-pay | Admitting: Obstetrics and Gynecology

## 2024-03-03 DIAGNOSIS — D509 Iron deficiency anemia, unspecified: Secondary | ICD-10-CM | POA: Insufficient documentation

## 2024-03-03 NOTE — Telephone Encounter (Signed)
 Auth Submission: NO AUTH NEEDED Site of care: [facility] Payer: [employer]BS Medication & CPT/J Code(s) submitted: Feraheme (ferumoxytol ) [id number] Diagnosis Code: D50.9 Route of submission (phone, fax, portal):  Phone # Fax # Auth type: Buy/Bill HB Units/visits requested: 510mg  x 2 doses Reference number:  Approval from: 03/03/2024 to 05/20/24    Dagoberto Armour, CPhT [facility] Phone: [phone] 03/03/2024

## 2024-03-05 ENCOUNTER — Other Ambulatory Visit (HOSPITAL_COMMUNITY): Payer: Self-pay | Admitting: Obstetrics and Gynecology

## 2024-03-11 ENCOUNTER — Encounter (HOSPITAL_COMMUNITY)
Admission: RE | Admit: 2024-03-11 | Discharge: 2024-03-11 | Disposition: A | Discharge status: Home / Self Care | Source: Ambulatory Visit | Attending: Obstetrics and Gynecology | Admitting: Obstetrics and Gynecology

## 2024-03-11 VITALS — BP 134/82 | HR 77 | Temp 97.7°F | Resp 165

## 2024-03-11 DIAGNOSIS — D509 Iron deficiency anemia, unspecified: Secondary | ICD-10-CM | POA: Insufficient documentation

## 2024-03-11 MED ORDER — SODIUM CHLORIDE 0.9 % IV SOLN
510.0000 mg | Freq: Once | INTRAVENOUS | Status: AC
  Administered 2024-03-11: 510 mg via INTRAVENOUS
  Filled 2024-03-11: qty 510

## 2024-03-17 LAB — OB RESULTS CONSOLE GBS: GBS: NEGATIVE

## 2024-03-18 ENCOUNTER — Encounter (HOSPITAL_COMMUNITY)
Admission: RE | Admit: 2024-03-18 | Discharge: 2024-03-18 | Disposition: A | Source: Ambulatory Visit | Attending: Obstetrics and Gynecology

## 2024-03-18 VITALS — BP 120/75 | HR 70 | Temp 98.0°F | Resp 16

## 2024-03-18 DIAGNOSIS — D509 Iron deficiency anemia, unspecified: Secondary | ICD-10-CM

## 2024-03-18 MED ORDER — SODIUM CHLORIDE 0.9 % IV SOLN
510.0000 mg | Freq: Once | INTRAVENOUS | Status: AC
  Administered 2024-03-18: 510 mg via INTRAVENOUS
  Filled 2024-03-18: qty 510

## 2024-03-24 NOTE — Patient Instructions (Signed)
 Rose Manning  03/24/2024   Your procedure is scheduled on:  04/06/2024  Arrive at 0745 at Entrance C on [address] at [facility]  and [employer]. You are invited to use the FREE valet parking or use the Visitor's parking deck.  Pick up the phone at the desk and dial [phone].  Call this number if you have problems the morning of surgery: [phone]  Remember:   Do not eat food:(After Midnight) Desps de medianoche.  You may drink clear liquids until  __0545___.  Clear liquids means a liquid you can see thru.  It can have color such as Cola or Kool aid.  Tea is OK and coffee as long as no milk or creamer of any kind.  Take these medicines the morning of surgery with A SIP OF WATER :  none   Do not wear jewelry, make-up or nail polish.  Do not wear lotions, powders, or perfumes. Do not wear deodorant.  Do not shave 48 hours prior to surgery.  Do not bring valuables to the hospital.  [facility] is not   responsible for any belongings or valuables brought to the hospital.  Contacts, dentures or bridgework may not be worn into surgery.  Leave suitcase in the car. After surgery it may be brought to your room.  For patients admitted to the hospital, checkout time is 11:00 AM the day of              discharge.      Please read over the following fact sheets that you were given:     Preparing for Surgery

## 2024-03-25 ENCOUNTER — Telehealth (HOSPITAL_COMMUNITY): Payer: Self-pay | Admitting: *Deleted

## 2024-03-25 ENCOUNTER — Encounter (HOSPITAL_COMMUNITY): Payer: Self-pay

## 2024-03-25 NOTE — Telephone Encounter (Signed)
 Preadmission screen

## 2024-03-26 ENCOUNTER — Encounter (HOSPITAL_COMMUNITY): Payer: Self-pay

## 2024-04-03 ENCOUNTER — Encounter (HOSPITAL_COMMUNITY)
Admission: RE | Admit: 2024-04-03 | Discharge: 2024-04-03 | Disposition: A | Discharge status: Home / Self Care | Source: Ambulatory Visit | Attending: Obstetrics and Gynecology | Admitting: Obstetrics and Gynecology

## 2024-04-03 DIAGNOSIS — Z3A38 38 weeks gestation of pregnancy: Secondary | ICD-10-CM | POA: Insufficient documentation

## 2024-04-03 DIAGNOSIS — Z01812 Encounter for preprocedural laboratory examination: Secondary | ICD-10-CM | POA: Insufficient documentation

## 2024-04-03 LAB — RPR: RPR Ser Ql: NONREACTIVE

## 2024-04-03 LAB — CBC
HCT: 33.6 % — ABNORMAL LOW (ref 36.0–46.0)
Hemoglobin: 10.4 g/dL — ABNORMAL LOW (ref 12.0–15.0)
MCH: 25.2 pg — ABNORMAL LOW (ref 26.0–34.0)
MCHC: 31 g/dL (ref 30.0–36.0)
MCV: 81.4 fL (ref 80.0–100.0)
Platelets: 182 K/uL (ref 150–400)
RBC: 4.13 MIL/uL (ref 3.87–5.11)
WBC: 5.3 K/uL (ref 4.0–10.5)
nRBC: 0 % (ref 0.0–0.2)

## 2024-04-03 LAB — TYPE AND SCREEN
ABO/RH(D): O POS
Antibody Screen: NEGATIVE

## 2024-04-06 ENCOUNTER — Other Ambulatory Visit: Payer: Self-pay | Admitting: Obstetrics and Gynecology

## 2024-04-06 ENCOUNTER — Inpatient Hospital Stay (HOSPITAL_COMMUNITY): Admitting: Anesthesiology

## 2024-04-06 ENCOUNTER — Inpatient Hospital Stay (HOSPITAL_COMMUNITY)
Admission: RE | Admit: 2024-04-06 | Discharge: 2024-04-08 | DRG: 784 | Disposition: A | Discharge status: Home / Self Care | Attending: Obstetrics and Gynecology | Admitting: Obstetrics and Gynecology

## 2024-04-06 ENCOUNTER — Other Ambulatory Visit: Payer: Self-pay

## 2024-04-06 ENCOUNTER — Encounter (HOSPITAL_COMMUNITY): Payer: Self-pay | Admitting: Obstetrics and Gynecology

## 2024-04-06 ENCOUNTER — Encounter (HOSPITAL_COMMUNITY): Admission: RE | Disposition: A | Payer: Self-pay | Source: Home / Self Care | Attending: Obstetrics and Gynecology

## 2024-04-06 DIAGNOSIS — O99214 Obesity complicating childbirth: Secondary | ICD-10-CM | POA: Diagnosis present

## 2024-04-06 DIAGNOSIS — Z91013 Allergy to seafood: Secondary | ICD-10-CM

## 2024-04-06 DIAGNOSIS — Z9884 Bariatric surgery status: Secondary | ICD-10-CM

## 2024-04-06 DIAGNOSIS — O9902 Anemia complicating childbirth: Secondary | ICD-10-CM | POA: Diagnosis present

## 2024-04-06 DIAGNOSIS — O9832 Other infections with a predominantly sexual mode of transmission complicating childbirth: Secondary | ICD-10-CM | POA: Diagnosis present

## 2024-04-06 DIAGNOSIS — D509 Iron deficiency anemia, unspecified: Secondary | ICD-10-CM | POA: Diagnosis present

## 2024-04-06 DIAGNOSIS — O34211 Maternal care for low transverse scar from previous cesarean delivery: Principal | ICD-10-CM | POA: Diagnosis present

## 2024-04-06 DIAGNOSIS — Z8249 Family history of ischemic heart disease and other diseases of the circulatory system: Secondary | ICD-10-CM | POA: Diagnosis not present

## 2024-04-06 DIAGNOSIS — Z833 Family history of diabetes mellitus: Secondary | ICD-10-CM | POA: Diagnosis not present

## 2024-04-06 DIAGNOSIS — Z3A39 39 weeks gestation of pregnancy: Secondary | ICD-10-CM | POA: Diagnosis not present

## 2024-04-06 DIAGNOSIS — Z302 Encounter for sterilization: Secondary | ICD-10-CM

## 2024-04-06 DIAGNOSIS — A6000 Herpesviral infection of urogenital system, unspecified: Secondary | ICD-10-CM | POA: Diagnosis present

## 2024-04-06 DIAGNOSIS — B009 Herpesviral infection, unspecified: Secondary | ICD-10-CM | POA: Diagnosis present

## 2024-04-06 DIAGNOSIS — O99844 Bariatric surgery status complicating childbirth: Secondary | ICD-10-CM | POA: Diagnosis present

## 2024-04-06 DIAGNOSIS — Z9851 Tubal ligation status: Secondary | ICD-10-CM

## 2024-04-06 DIAGNOSIS — Z98891 History of uterine scar from previous surgery: Secondary | ICD-10-CM

## 2024-04-06 SURGERY — Surgical Case
Anesthesia: Spinal

## 2024-04-06 MED ORDER — DEXMEDETOMIDINE HCL IN NACL 80 MCG/20ML IV SOLN
INTRAVENOUS | Status: DC | PRN
  Administered 2024-04-06: 8 ug via INTRAVENOUS
  Administered 2024-04-06: 4 ug via INTRAVENOUS

## 2024-04-06 MED ORDER — ZOLPIDEM TARTRATE 5 MG PO TABS
5.0000 mg | ORAL_TABLET | Freq: Every evening | ORAL | Status: DC | PRN
Start: 2024-04-06 — End: 2024-04-08

## 2024-04-06 MED ORDER — CEFAZOLIN SODIUM-DEXTROSE 2-4 GM/100ML-% IV SOLN
INTRAVENOUS | Status: AC
Start: 2024-04-06 — End: 2024-04-06
  Filled 2024-04-06: qty 100

## 2024-04-06 MED ORDER — SODIUM CHLORIDE 0.9 % IR SOLN
Status: DC | PRN
  Administered 2024-04-06 (×2): 1

## 2024-04-06 MED ORDER — SENNOSIDES-DOCUSATE SODIUM 8.6-50 MG PO TABS
2.0000 | ORAL_TABLET | Freq: Every day | ORAL | Status: DC
  Administered 2024-04-07 – 2024-04-08 (×2): 2 via ORAL
  Filled 2024-04-06 (×2): qty 2

## 2024-04-06 MED ORDER — BUPIVACAINE IN DEXTROSE 0.75-8.25 % IT SOLN
INTRATHECAL | Status: DC | PRN
  Administered 2024-04-06: 1.2 mL via INTRATHECAL
  Administered 2024-04-06: 1.6 mL via INTRATHECAL

## 2024-04-06 MED ORDER — OXYTOCIN-SODIUM CHLORIDE 30-0.9 UT/500ML-% IV SOLN
2.5000 [IU]/h | INTRAVENOUS | Status: AC
  Filled 2024-04-06: qty 500

## 2024-04-06 MED ORDER — SIMETHICONE 80 MG PO CHEW: 80.0000 mg | CHEWABLE_TABLET | ORAL | Status: DC | PRN

## 2024-04-06 MED ORDER — OXYCODONE HCL 5 MG PO TABS
5.0000 mg | ORAL_TABLET | ORAL | Status: DC | PRN
  Administered 2024-04-08: 5 mg via ORAL
  Filled 2024-04-06: qty 1

## 2024-04-06 MED ORDER — ACETAMINOPHEN 10 MG/ML IV SOLN
INTRAVENOUS | Status: AC
  Filled 2024-04-06: qty 100

## 2024-04-06 MED ORDER — ONDANSETRON HCL 4 MG/2ML IJ SOLN
INTRAMUSCULAR | Status: DC | PRN
  Administered 2024-04-06: 4 mg via INTRAVENOUS

## 2024-04-06 MED ORDER — FENTANYL CITRATE (PF) 100 MCG/2ML IJ SOLN
INTRAMUSCULAR | Status: DC | PRN
  Administered 2024-04-06: 15 ug via INTRATHECAL

## 2024-04-06 MED ORDER — ACETAMINOPHEN 10 MG/ML IV SOLN
INTRAVENOUS | Status: DC | PRN
Start: 2024-04-06 — End: 2024-04-06
  Administered 2024-04-06: 1000 mg via INTRAVENOUS

## 2024-04-06 MED ORDER — PHENYLEPHRINE HCL-NACL 20-0.9 MG/250ML-% IV SOLN
INTRAVENOUS | Status: AC
  Filled 2024-04-06: qty 250

## 2024-04-06 MED ORDER — DEXMEDETOMIDINE HCL IN NACL 80 MCG/20ML IV SOLN
INTRAVENOUS | Status: AC
  Filled 2024-04-06: qty 20

## 2024-04-06 MED ORDER — SIMETHICONE 80 MG PO CHEW
80.0000 mg | CHEWABLE_TABLET | Freq: Three times a day (TID) | ORAL | Status: DC
  Administered 2024-04-06 – 2024-04-08 (×5): 80 mg via ORAL
  Filled 2024-04-06 (×6): qty 1

## 2024-04-06 MED ORDER — KETOROLAC TROMETHAMINE 30 MG/ML IJ SOLN
30.0000 mg | Freq: Four times a day (QID) | INTRAMUSCULAR | Status: AC
  Administered 2024-04-06 – 2024-04-07 (×4): 30 mg via INTRAVENOUS
  Filled 2024-04-06 (×4): qty 1

## 2024-04-06 MED ORDER — LACTATED RINGERS IV SOLN
INTRAVENOUS | Status: DC
  Administered 2024-04-06: 125 mL/h via INTRAVENOUS

## 2024-04-06 MED ORDER — WITCH HAZEL-GLYCERIN EX PADS: 1.0000 | MEDICATED_PAD | CUTANEOUS | Status: DC | PRN

## 2024-04-06 MED ORDER — TRANEXAMIC ACID-NACL 1000-0.7 MG/100ML-% IV SOLN
INTRAVENOUS | Status: DC | PRN
  Administered 2024-04-06: 1000 mg via INTRAVENOUS

## 2024-04-06 MED ORDER — IBUPROFEN 600 MG PO TABS
600.0000 mg | ORAL_TABLET | Freq: Four times a day (QID) | ORAL | Status: DC
  Administered 2024-04-07 – 2024-04-08 (×4): 600 mg via ORAL
  Filled 2024-04-06 (×4): qty 1

## 2024-04-06 MED ORDER — MORPHINE SULFATE (PF) 0.5 MG/ML IJ SOLN
INTRAMUSCULAR | Status: AC
  Filled 2024-04-06: qty 10

## 2024-04-06 MED ORDER — ONDANSETRON HCL 4 MG/2ML IJ SOLN
INTRAMUSCULAR | Status: AC
  Filled 2024-04-06: qty 2

## 2024-04-06 MED ORDER — FENTANYL CITRATE (PF) 100 MCG/2ML IJ SOLN
INTRAMUSCULAR | Status: AC
  Filled 2024-04-06: qty 2

## 2024-04-06 MED ORDER — ACETAMINOPHEN 500 MG PO TABS
1000.0000 mg | ORAL_TABLET | Freq: Four times a day (QID) | ORAL | Status: DC
  Administered 2024-04-06 – 2024-04-08 (×8): 1000 mg via ORAL
  Filled 2024-04-06 (×8): qty 2

## 2024-04-06 MED ORDER — MORPHINE SULFATE (PF) 0.5 MG/ML IJ SOLN
INTRAMUSCULAR | Status: DC | PRN
  Administered 2024-04-06: 150 ug via INTRATHECAL

## 2024-04-06 MED ORDER — PRENATAL MULTIVITAMIN CH
1.0000 | ORAL_TABLET | Freq: Every day | ORAL | Status: DC
  Administered 2024-04-07 – 2024-04-08 (×2): 1 via ORAL
  Filled 2024-04-06 (×3): qty 1

## 2024-04-06 MED ORDER — COCONUT OIL OIL: 1.0000 | TOPICAL_OIL | Status: DC | PRN

## 2024-04-06 MED ORDER — MENTHOL 3 MG MT LOZG: 1.0000 | LOZENGE | OROMUCOSAL | Status: DC | PRN

## 2024-04-06 MED ORDER — DIPHENHYDRAMINE HCL 25 MG PO CAPS
25.0000 mg | ORAL_CAPSULE | Freq: Four times a day (QID) | ORAL | Status: DC | PRN
Start: 2024-04-06 — End: 2024-04-08
  Administered 2024-04-06 – 2024-04-07 (×2): 25 mg via ORAL
  Filled 2024-04-06 (×2): qty 1

## 2024-04-06 MED ORDER — SOD CITRATE-CITRIC ACID 500-334 MG/5ML PO SOLN
ORAL | Status: AC
  Filled 2024-04-06: qty 30

## 2024-04-06 MED ORDER — STERILE WATER FOR IRRIGATION IR SOLN
Status: DC | PRN
  Administered 2024-04-06: 1

## 2024-04-06 MED ORDER — OXYTOCIN-SODIUM CHLORIDE 30-0.9 UT/500ML-% IV SOLN
INTRAVENOUS | Status: AC
  Filled 2024-04-06: qty 500

## 2024-04-06 MED ORDER — OXYTOCIN-SODIUM CHLORIDE 30-0.9 UT/500ML-% IV SOLN
INTRAVENOUS | Status: DC | PRN
  Administered 2024-04-06: 300 mL via INTRAVENOUS

## 2024-04-06 MED ORDER — CEFAZOLIN SODIUM-DEXTROSE 2-4 GM/100ML-% IV SOLN
2.0000 g | INTRAVENOUS | Status: AC
  Administered 2024-04-06: 2 g via INTRAVENOUS

## 2024-04-06 MED ORDER — SOD CITRATE-CITRIC ACID 500-334 MG/5ML PO SOLN
30.0000 mL | ORAL | Status: AC
  Administered 2024-04-06: 30 mL via ORAL

## 2024-04-06 MED ORDER — TRANEXAMIC ACID-NACL 1000-0.7 MG/100ML-% IV SOLN
INTRAVENOUS | Status: AC
Start: 2024-04-06 — End: 2024-04-06
  Filled 2024-04-06: qty 100

## 2024-04-06 MED ORDER — SODIUM CHLORIDE 0.9 % IV SOLN: 500.0000 mg | INTRAVENOUS | Status: DC

## 2024-04-06 MED ORDER — DIBUCAINE (PERIANAL) 1 % EX OINT: 1.0000 | TOPICAL_OINTMENT | CUTANEOUS | Status: DC | PRN

## 2024-04-06 MED ORDER — ENOXAPARIN SODIUM 60 MG/0.6ML IJ SOSY
0.5000 mg/kg | PREFILLED_SYRINGE | INTRAMUSCULAR | Status: DC
  Administered 2024-04-07 – 2024-04-08 (×2): 55 mg via SUBCUTANEOUS
  Filled 2024-04-06 (×2): qty 0.6

## 2024-04-06 MED ORDER — DEXAMETHASONE SOD PHOSPHATE PF 10 MG/ML IJ SOLN
INTRAMUSCULAR | Status: DC | PRN
  Administered 2024-04-06 (×2): 5 mg via INTRAVENOUS

## 2024-04-06 MED ORDER — PHENYLEPHRINE HCL-NACL 20-0.9 MG/250ML-% IV SOLN
INTRAVENOUS | Status: DC | PRN
  Administered 2024-04-06: 60 ug/min via INTRAVENOUS

## 2024-04-06 SURGICAL SUPPLY — 38 items
BENZOIN TINCTURE PRP APPL 2/3 (GAUZE/BANDAGES/DRESSINGS) ×1 IMPLANT
CHLORAPREP W/TINT 26ML (MISCELLANEOUS) ×2 IMPLANT
CLAMP UMBILICAL CORD (MISCELLANEOUS) ×1 IMPLANT
CLOTH BEACON ORANGE TIMEOUT ST (SAFETY) ×1 IMPLANT
DERMABOND ADVANCED .7 DNX12 (GAUZE/BANDAGES/DRESSINGS) IMPLANT
DISSECTOR SURG LIGASURE 21 (MISCELLANEOUS) IMPLANT
DRSG OPSITE POSTOP 4X10 (GAUZE/BANDAGES/DRESSINGS) ×1 IMPLANT
ELECTRODE REM PT RTRN 9FT ADLT (ELECTROSURGICAL) ×1 IMPLANT
EXTRACTOR VACUUM M CUP 4 TUBE (SUCTIONS) IMPLANT
GLOVE BIO SURGEON STRL SZ7.5 (GLOVE) ×1 IMPLANT
GLOVE BIOGEL PI IND STRL 7.0 (GLOVE) ×2 IMPLANT
GLOVE BIOGEL PI IND STRL 7.5 (GLOVE) ×1 IMPLANT
GOWN STRL REUS W/TWL LRG LVL3 (GOWN DISPOSABLE) ×3 IMPLANT
KIT ABG SYR 3ML LUER SLIP (SYRINGE) IMPLANT
LIGASURE IMPACT 36 18CM CVD LR (INSTRUMENTS) IMPLANT
MAT PREVALON FULL STRYKER (MISCELLANEOUS) IMPLANT
NDL HYPO 25X5/8 SAFETYGLIDE (NEEDLE) IMPLANT
NEEDLE HYPO 22GX1.5 SAFETY (NEEDLE) IMPLANT
NEEDLE HYPO 25X5/8 SAFETYGLIDE (NEEDLE) IMPLANT
NS IRRIG 1000ML POUR BTL (IV SOLUTION) ×1 IMPLANT
PACK C SECTION WH (CUSTOM PROCEDURE TRAY) ×1 IMPLANT
PAD OB MATERNITY 4.3X12.25 (PERSONAL CARE ITEMS) ×1 IMPLANT
PENCIL SMOKE EVAC W/HOLSTER (ELECTROSURGICAL) ×1 IMPLANT
RTRCTR C-SECT PINK 25CM LRG (MISCELLANEOUS) ×1 IMPLANT
STRIP CLOSURE SKIN 1/2X4 (GAUZE/BANDAGES/DRESSINGS) ×1 IMPLANT
SUT CHROMIC 2 0 CT 1 (SUTURE) ×1 IMPLANT
SUT MNCRL 0 VIOLET CTX 36 (SUTURE) ×1 IMPLANT
SUT MNCRL AB 3-0 PS2 27 (SUTURE) ×1 IMPLANT
SUT PLAIN ABS 2-0 CT1 27XMFL (SUTURE) IMPLANT
SUT VIC AB 0 CT1 36 (SUTURE) ×1 IMPLANT
SUT VIC AB 0 CTX36XBRD ANBCTRL (SUTURE) ×3 IMPLANT
SUT VIC AB 2-0 SH 27XBRD (SUTURE) ×2 IMPLANT
SUT VIC AB 4-0 KS 27 (SUTURE) IMPLANT
SUTURE PLAIN GUT 2.0 ETHICON (SUTURE) ×1 IMPLANT
SYR CONTROL 10ML LL (SYRINGE) IMPLANT
TOWEL OR 17X24 6PK STRL BLUE (TOWEL DISPOSABLE) ×1 IMPLANT
TRAY FOLEY W/BAG SLVR 14FR LF (SET/KITS/TRAYS/PACK) ×1 IMPLANT
WATER STERILE IRR 1000ML POUR (IV SOLUTION) ×1 IMPLANT

## 2024-04-06 NOTE — Transfer of Care (Signed)
 Immediate Anesthesia Transfer of Care Note  Patient: Rose Manning  Procedure(s) Performed: CESAREAN SECTION, WITH BILATERAL TUBAL LIGATION  Patient Location: PACU  Anesthesia Type:Spinal  Level of Consciousness: awake  Airway & Oxygen Therapy: Patient Spontanous Breathing  Post-op Assessment: Report given to RN and Post -op Vital signs reviewed and stable  Post vital signs: Reviewed and stable  Last Vitals:  Vitals Value Taken Time  BP 99/49 04/06/24 12:23  Temp    Pulse 52 04/06/24 12:25  Resp 24 04/06/24 12:25  SpO2 95 % 04/06/24 12:25  Vitals shown include unfiled device data.  Last Pain:  Vitals:   04/06/24 0811  TempSrc: Oral      Patients Stated Pain Goal: 6 (04/06/24 9185)  Complications: No notable events documented.

## 2024-04-06 NOTE — Anesthesia Preprocedure Evaluation (Signed)
 Anesthesia Evaluation  Patient identified by MRN, date of birth, ID band Patient awake    Reviewed: Allergy & Precautions, NPO status , Patient's Chart, lab work & pertinent test results  Airway Mallampati: II  TM Distance: >3 FB Neck ROM: Full    Dental no notable dental hx.    Pulmonary neg pulmonary ROS   Pulmonary exam normal        Cardiovascular hypertension,  Rhythm:Regular Rate:Normal     Neuro/Psych  Headaches  Anxiety Depression       GI/Hepatic negative GI ROS, Neg liver ROS,,,  Endo/Other  negative endocrine ROS    Renal/GU negative Renal ROS  negative genitourinary   Musculoskeletal   Abdominal  (+) + obese  Peds  Hematology  (+) Blood dyscrasia, anemia Lab Results      Component                Value               Date                      WBC                      5.3                 04/03/2024                HGB                      10.4 (L)            04/03/2024                HCT                      33.6 (L)            04/03/2024                MCV                      81.4                04/03/2024                PLT                      182                 04/03/2024                Anesthesia Other Findings   Reproductive/Obstetrics (+) Pregnancy                              Anesthesia Physical Anesthesia Plan  ASA: 3  Anesthesia Plan: Spinal   Post-op Pain Management:    Induction:   PONV Risk Score and Plan: 2 and Treatment may vary due to age or medical condition  Airway Management Planned: Natural Airway  Additional Equipment: None  Intra-op Plan:   Post-operative Plan:   Informed Consent: I have reviewed the patients History and Physical, chart, labs and discussed the procedure including the risks, benefits and alternatives for the proposed anesthesia with the patient or authorized representative who has indicated his/her understanding and  acceptance.     Dental advisory  given  Plan Discussed with: CRNA  Anesthesia Plan Comments:         Anesthesia Quick Evaluation

## 2024-04-06 NOTE — Lactation Note (Signed)
 This note was copied from a baby's chart. Lactation Consultation Note  Patient Name: Rose Manning Unijb'd Date: 04/06/2024 Age:35 hours Reason for consult: Initial assessment;Term (hx HSV)  P3- [name] would like to exclusively breast feed her infant, but is open to supplementing with formula if needed. [name] reports that infant latched really well so far. Infant had just finished a feeding, so LC encouraged [name] to call out for a latch assessment. [name] reports being able to squeeze her breast and colostrum comes out. LC praised [name]. [name] denies having any questions or concerns. [name] requested a manual pump and LC provided it.  LC reviewed the first 24 hr birthday nap, day 2 cluster feeding, feeding infant on cue 8-12x in 24 hrs, not allowing infant to go over 3 hrs without a feeding, CDC milk storage guidelines, LC services handout and engorgement/breast care. LC encouraged [name] to call for further assistance as needed.  Maternal Data Has patient been taught Hand Expression?: No Does the patient have breastfeeding experience prior to this delivery?: Yes How long did the patient breastfeed?: first child was formula only, second child was formula and breast milk for 9 months  Feeding Mother's Current Feeding Choice: Breast Milk  Lactation Tools Discussed/Used Tools: Pump Breast pump type: Manual Pump Education: Setup, frequency, and cleaning;Milk Storage Reason for Pumping: [name] request Pumping frequency: 15-20 min every 3 hrs  Interventions Interventions: Breast feeding basics reviewed;Hand pump;Education;LC Services brochure  Discharge Discharge Education: Engorgement and breast care;Warning signs for feeding baby Pump: Manual;Hands Free;Personal  Consult Status Consult Status: Follow-up Date: 04/07/24 Follow-up type: In-patient    Recardo Hoit BS, IBCLC 04/06/2024, 5:32 PM

## 2024-04-06 NOTE — H&P (Addendum)
 OB ADMISSION/ HISTORY & PHYSICAL:  Admission Date: 04/06/2024  7:49 AM  Admit Diagnosis: Status post repeat low transverse cesarean section [Z98.891] History of cesarean section [Z98.891]    Rose Manning is a 33 y.o. female H2E7957 at [redacted]w[redacted]d presenting for repeat cesarean section and bilateral tubal ligation. Denies contractions, leaking of fluid, or vaginal bleeding. Endorses + fetal movement.   Prenatal History: H2E7957   EDC: 04/11/2024, by Last Menstrual Period   Prenatal course complicated by: History of C/s with G2, polyhydramnios, ?abruption, desires repeat Obesity, BMI 40 HSV, take prophylactic Valtrex History of depression, no current meds Anemia, on PO iron  History of gastric bypass History of gHTN  Prenatal Labs: ABO, Rh:   O POS Antibody: NEG (11/14 0955) Rubella: Immune (05/05 0000)  RPR: NON REACTIVE (11/14 1030)  HBsAg: Negative (05/05 0000)  HIV: Non-reactive (05/05 0000)  GBS: Negative/-- (10/28 0000)  1 hr Glucola : 79 Genetic Screening: Low risk Panorama Ultrasound: normal anatomy, anterior placenta, EFW 51% at [redacted]w[redacted]d    Maternal Diabetes: No Genetic Screening: Normal Maternal Ultrasounds/Referrals: Normal Fetal Ultrasounds or other Referrals:  None Maternal Substance Abuse:  No Significant Maternal Medications:  None Significant Maternal Lab Results:  Group B Strep negative Other Comments:  None  Medical / Surgical History : Past medical history:  Past Medical History:  Diagnosis Date   Anemia    Anxiety    Depression    History of gestational hypertension    History of kidney stones    Hx of varicella    Migraine    Obesity    Polyhydramnios 12/27/2019   Pregnancy induced hypertension    Thyroid cyst     Past surgical history:  Past Surgical History:  Procedure Laterality Date   BARIATRIC SURGERY     CESAREAN SECTION N/A 12/28/2019   Procedure: CESAREAN SECTION;  Surgeon: Armond Cape, MD;  Location: [facility] OR[facility];  Service:  Obstetrics;  Laterality: N/A;   CHOLECYSTECTOMY  01/2009   DILATION AND CURETTAGE OF UTERUS  2019   D&E   DILATION AND EVACUATION Bilateral 08/14/2017   Procedure: Suction DILATATION AND EVACUATION;  Surgeon: Rose Chick, MD;  Location: [facility] ORS;  Service: Gynecology;  Laterality: Bilateral;   VULVECTOMY N/A 02/02/2022   Procedure: VULVECTOMY;  Surgeon: Rose Chick, MD;  Location: [facility] OR;  Service: Gynecology;  Laterality: N/A;   WISDOM TOOTH EXTRACTION      Family History:  Family History  Problem Relation Age of Onset   Hypertension Mother    Urolithiasis Mother    Migraines Mother    Hypertension Father    Hypertension Maternal Grandmother    Hypertension Maternal Grandfather    Alzheimer's disease Maternal Grandfather    Hypertension Paternal Grandmother    Hypertension Paternal Grandfather    Heart disease Paternal Grandfather    Lupus Maternal Aunt    Rheum arthritis Maternal Aunt    Diabetes Cousin    Anesthesia problems Neg Hx     Social History:  reports that she has never smoked. She has never used smokeless tobacco. She reports that she does not drink alcohol and does not use drugs.  Allergies: Shellfish allergy and Fluoxetine   Current Medications at time of admission:  Medications Prior to Admission  Medication Sig Dispense Refill Last Dose/Taking   Iron -FA-B Cmp-C-Biot-Probiotic (FUSION PLUS PO) Take 1 tablet by mouth daily.   Taking   Prenatal Vit-Fe Fumarate-FA (PRENATAL MULTIVITAMIN) TABS tablet Take 1 tablet by mouth daily at 12 noon.  04/05/2024    Review of Systems: Review of Systems  All other systems reviewed and are negative.  Physical Exam: Vital signs and nursing notes reviewed.  Patient Vitals for the past 24 hrs:  BP Temp Temp src Pulse Resp SpO2 Height Weight  04/06/24 0814 -- -- -- -- -- -- 5' 5 (1.651 m) --  04/06/24 0811 122/85 98.2 F (36.8 C) Oral 64 18 94 % 5' 5 (1.651 m) 111.1 kg    General: AAO x 3, NAD Heart:  RRR Lungs:CTAB Abdomen: Gravid, NT Extremities: no edema SVE:   deferred  Labs:   Recent Labs    04/03/24 1030  WBC 5.3  HGB 10.4*  HCT 33.6*  PLT 182   Assessment/Plan: 33 y.o. H2E7957 at [redacted]w[redacted]d, repeat cesarean section with bilateral tubal ligation  Risk of C/S reviewed including infection, bleeding, possible need for blood transfusion and the risk including HIV, hepatitis, fever/rash, injury to surrounding organ structures and internal scar tissue. All questions answered.   POC discussed with patient and support team, all questions answered.  Dr. Henry notified of admission/plan of care.  Alan MARLA Molt CNM, MSN 04/06/2024, 8:58 AM  Rba of sterilization reviewed as well including but not limited to bii risk of regret and risk of failure.  Pt agreeable to bilateral salpingectomy.

## 2024-04-06 NOTE — Plan of Care (Signed)

## 2024-04-06 NOTE — Anesthesia Procedure Notes (Signed)
 Spinal  Patient location during procedure: OR Start time: 04/06/2024 10:10 AM End time: 04/06/2024 10:13 AM Staffing Performed: anesthesiologist  Anesthesiologist: Dorethea Cordella SQUIBB, DO Performed by: Dorethea Cordella SQUIBB, DO Authorized by: Dorethea Cordella SQUIBB, DO   Preanesthetic Checklist Completed: patient identified, IV checked, site marked, risks and benefits discussed, surgical consent, monitors and equipment checked, pre-op evaluation and timeout performed Spinal Block Patient position: sitting Prep: DuraPrep Patient monitoring: heart rate, cardiac monitor, continuous pulse ox and blood pressure Approach: midline Location: L3-4 Injection technique: single-shot Needle Needle type: Pencan  Needle gauge: 24 G Needle length: 10 cm Assessment Events: failed spinal and CSF return Additional Notes Patient identified. Risks/Benefits/Options discussed with patient including but not limited to bleeding, infection, nerve damage, paralysis, failed block, incomplete pain control, headache, blood pressure changes, nausea, vomiting, reactions to medications, itching and postpartum back pain. Confirmed with bedside nurse the patient's most recent platelet count. Confirmed with patient that they are not currently taking any anticoagulation, have any bleeding history or any family history of bleeding disorders. Patient expressed understanding and wished to proceed. All questions were answered. Sterile technique was used throughout the entire procedure. Please see nursing notes for vital signs. Warning signs of high block given to the patient including shortness of breath, tingling/numbness in hands, complete motor block, or any concerning symptoms with instructions to call for help. Patient was given instructions on fall risk and not to get out of bed. All questions and concerns addressed with instructions to call with any issues or inadequate analgesia.

## 2024-04-06 NOTE — Anesthesia Postprocedure Evaluation (Signed)
 Anesthesia Post Note  Patient: Rose Manning  Procedure(s) Performed: CESAREAN SECTION, WITH BILATERAL TUBAL LIGATION     Patient location during evaluation: Mother Baby Anesthesia Type: Spinal Level of consciousness: oriented and awake and alert Pain management: pain level controlled Vital Signs Assessment: post-procedure vital signs reviewed and stable Respiratory status: spontaneous breathing and respiratory function stable Cardiovascular status: blood pressure returned to baseline and stable Postop Assessment: no headache, no backache, no apparent nausea or vomiting and able to ambulate Anesthetic complications: no   No notable events documented.  Last Vitals:  Vitals:   04/06/24 1337 04/06/24 1445  BP: 93/64 126/78  Pulse: (!) 56 (!) 50  Resp: 18 18  Temp: (!) 36.4 C 36.7 C  SpO2: 93% 100%    Last Pain:  Vitals:   04/06/24 1529  TempSrc:   PainSc: 6                  Andra Heslin P Luisdavid Hamblin

## 2024-04-06 NOTE — Op Note (Signed)
 Cesarean Section Procedure Note  Indications: H2E7957 at 75 2/7wks presenting for repeat c-section and sterilization.  Pre-operative Diagnosis: 1.39 2/7wks 2.h/o esarean section 3.Desires Sterilization    Post-operative Diagnosis: 1.39 2/7wks 2.h/o esarean section 3.Desires Sterilization  Procedure: 1.Repeat Low Transverse Cesarean Section 2.Bilateral Salpingectomy  PR LIG/TRNSXJ FALOPIAN TUBE CESAREAN DEL/ABDML SURG [[id number]]  Surgeon: Henry Slough, MD    Assistants: Dr. Barkley Angles  Anesthesia: Regional  Anesthesiologist: Dorethea Cordella SQUIBB, DO   Procedure Details  The patient was taken to the operating room after the risks, benefits, complications, treatment options, and expected outcomes were discussed with the patient.  The patient concurred with the proposed plan, giving informed consent which was signed and witnessed. The patient was taken to Operating Room C, identified as DEARI SESSLER and the procedure verified as C-Section Delivery. A Time Out was held and the above information confirmed.  After induction of anesthesia by obtaining a surgical level via the spinal, the patient was prepped and draped in the usual sterile manner. A Pfannenstiel skin incision was made and carried down through the subcutaneous tissue to the underlying layer of fascia.  The fascia was incised bilaterally and extended transversely bilaterally with the Mayo scissors. Kocher clamps were placed on the inferior aspect of the fascial incision and the underlying rectus muscle was separated from the fascia. The same was done on the superior aspect of the fascial incision.  The peritoneum was identified, entered bluntly and extended manually. An Alexis self-retaining retractor was placed.  The utero-vesical peritoneal reflection was incised transversely and the bladder flap was bluntly freed from the lower uterine segment. A low transverse uterine incision was made with the scalpel and extended  bilaterally with the bandage scissors.  The infant was delivered in vertex position without difficulty. After the umbilical cord was clamped and cut, the infant was handed to the awaiting pediatricians.  Cord blood was obtained for evaluation.  The placenta was removed intact and appeared to be within normal limits. The uterus was cleared of all clots and debris. The uterine incision was closed with running interlocking sutures of 0 Vicryl and a second imbricating layer was performed as well.   Bilateral tubes and ovaries appeared to be within normal limits.  Good hemostasis was noted.  Copious irrigation was performed until clear.  The uterus was tilted and the left fallopian tube grasped in the midportion with a babcock after carrying it out to its fimbriated end and excising with the ligasure. The same was done on the contralateral side.  The peritoneum was repaired with 2-0 chromic via a running suture.  The fascia was reapproximated with a running suture of 0 Vicryl. The subcutaneous tissue was reapproximated with 4 interrupted sutures of 2-0 plain.  The skin was reapproximated with a subcuticular suture of 4-0 vicryl.  Steristrips were applied with benzoin.  Instrument, sponge, and needle counts were correct prior to abdominal closure and at the conclusion of the case.  The patient was awaiting transfer to the recovery room in good condition.  Findings: Live female infant with Apgars 9 at one minute and 9 at five minutes.  Normal appearing bilateral ovaries and fallopian tubes were noted.  Estimated Blood Loss:  171 ml         Drains: foley to gravity 425 cc         Total IV Fluids: 2100 ml         Specimens to Pathology: Bilateral Fallopian Tubes  Complications:  None; patient tolerated the procedure well.         Disposition: PACU - hemodynamically stable.         Condition: stable  Attending Attestation: I performed the procedure.  I was present and scrubbed and the assistant  was required due to complexity of anatomy.

## 2024-04-07 ENCOUNTER — Encounter (HOSPITAL_COMMUNITY): Payer: Self-pay | Admitting: Obstetrics and Gynecology

## 2024-04-07 LAB — CBC
HCT: 26.3 % — ABNORMAL LOW (ref 36.0–46.0)
Hemoglobin: 8.4 g/dL — ABNORMAL LOW (ref 12.0–15.0)
MCH: 25.4 pg — ABNORMAL LOW (ref 26.0–34.0)
MCHC: 31.9 g/dL (ref 30.0–36.0)
MCV: 79.5 fL — ABNORMAL LOW (ref 80.0–100.0)
Platelets: 147 K/uL — ABNORMAL LOW (ref 150–400)
RBC: 3.31 MIL/uL — ABNORMAL LOW (ref 3.87–5.11)
WBC: 10.4 K/uL (ref 4.0–10.5)
nRBC: 0 % (ref 0.0–0.2)

## 2024-04-07 MED ORDER — EPINEPHRINE 0.3 MG/0.3ML IJ SOAJ: 0.3000 mg | Freq: Once | INTRAMUSCULAR | Status: DC | PRN

## 2024-04-07 MED ORDER — SODIUM CHLORIDE 0.9 % IV SOLN
500.0000 mg | Freq: Once | INTRAVENOUS | Status: AC
  Administered 2024-04-07: 500 mg via INTRAVENOUS
  Filled 2024-04-07: qty 25

## 2024-04-07 MED ORDER — ALBUTEROL SULFATE (2.5 MG/3ML) 0.083% IN NEBU: 2.5000 mg | INHALATION_SOLUTION | Freq: Once | RESPIRATORY_TRACT | Status: DC | PRN

## 2024-04-07 MED ORDER — METHYLPREDNISOLONE SODIUM SUCC 125 MG IJ SOLR: 125.0000 mg | Freq: Once | INTRAMUSCULAR | Status: DC | PRN

## 2024-04-07 MED ORDER — DIPHENHYDRAMINE HCL 50 MG/ML IJ SOLN: 25.0000 mg | Freq: Once | INTRAMUSCULAR | Status: DC | PRN

## 2024-04-07 MED ORDER — SODIUM CHLORIDE 0.9 % IV SOLN: INTRAVENOUS | Status: AC | PRN

## 2024-04-07 MED ORDER — SODIUM CHLORIDE 0.9 % IV BOLUS: 500.0000 mL | Freq: Once | INTRAVENOUS | Status: DC | PRN

## 2024-04-07 NOTE — Lactation Note (Signed)
 This note was copied from a baby's chart. Lactation Consultation Note  Patient Name: Rose Manning Unijb'd Date: 04/07/2024 Age:35 hours Reason for consult: Follow-up assessment. See [name]: mR- hx of Thyroid enlargement, anemia and gastric by-pass.  [name] had infant latched on her right breast using the football hold, infant was still breastfeeding when [facility] left the room. Per [name], infant is cluster feeding and latching well today. Most feeding are 30-45 minutes in length. [name] does not have any concerns for LC at this time. [name] will continue to breastfeed infant by cues, on demand, 8-12 times within 24 hours, skin to skin. LC discussed maternal rest, meals and hydration.    Maternal Data    Feeding Mother's Current Feeding Choice: Breast Milk  LATCH Score Latch: Grasps breast easily, tongue down, lips flanged, rhythmical sucking.  Audible Swallowing: A few with stimulation  Type of Nipple: Everted at rest and after stimulation  Comfort (Breast/Nipple): Soft / non-tender  Hold (Positioning): No assistance needed to correctly position infant at breast.  LATCH Score: 9   Lactation Tools Discussed/Used    Interventions Interventions: Skin to skin;Education  Discharge    Consult Status Consult Status: Follow-up Date: 04/08/24 Follow-up type: In-patient    Grayce LULLA Batter 04/07/2024, 6:11 PM

## 2024-04-07 NOTE — Progress Notes (Signed)
 Subjective: POD# 1 Live born female  Birth Weight: 7 lb 8.3 oz (3410 g) APGAR: 9, 9  Newborn Delivery   Birth date/time: 04/06/2024 11:17:00 Delivery type: C-Section, Low Transverse Trial of labor: No C-section categorization: Repeat    Baby name: Shiloh  Delivering provider: HENRY SLOUGH  Feeding: breast  Pain control at delivery: Spinal  Reports feeling well. Pain well controlled.   Patient reports tolerating PO.   Pain controlled with acetaminophen  and prescription NSAID's including ketorolac  (Toradol ) Denies HA/SOB/C/P/N/V/dizziness. She reports vaginal bleeding as normal, without clots. She is ambulating and urinating without difficulty.     Objective: Vitals:   04/06/24 1645 04/06/24 2035 04/06/24 2330 04/07/24 0530  BP: 121/72  99/63 103/68  Pulse: (!) 50  (!) 58 (!) 41  Resp: 18 18 18 18   Temp:  97.7 F (36.5 C)  97.9 F (36.6 C)  TempSrc:  Oral  Oral  SpO2: 100% 97% 99% 96%  Weight:      Height:       Intake/Output Summary (Last 24 hours) at 04/07/2024 0629 Last data filed at 04/07/2024 9676 Gross per 24 hour  Intake 2600 ml  Output 1678 ml  Net 922 ml     Recent Labs    04/07/24 0417  WBC 10.4  HGB 8.4*  HCT 26.3*  PLT 147*    Blood type: --/--/O POS (11/14 0955)  Rubella: Immune (05/05 0000)   Physical Exam:  General: alert and cooperative CV: Regular rate and rhythm Resp: clear Abdomen: soft, nontender, normal bowel sounds Incision: clean and dry Uterine Fundus: firm, below umbilicus, nontender Lochia: minimal Ext: extremities normal, atraumatic, no cyanosis or edema  Assessment/Plan: 33 y.o.   POD# 1. [id number]                  Principal Problem:   Postpartum care following cesarean delivery 11/17  Encourage rest when baby rests Breastfeeding support Encourage to ambulate Routine post-op care Active Problems:   Morbid obesity (HCC)  Lovenox daily   Encourage ambulation   Herpes simplex  Asymptomatic   Iron  deficiency  anemia, unspecified  Hgb this AM 8.4  Plan IV Venofer  today  Start PO iron  tomorrow   Status post repeat low transverse cesarean section   S/P tubal ligation   Continue current care.             Alan MARLA Molt, CNM, MSN 04/07/2024, 6:29 AM

## 2024-04-07 NOTE — Plan of Care (Signed)
  Problem: Education: Goal: Knowledge of General Education information will improve Description: Including pain rating scale, medication(s)/side effects and non-pharmacologic comfort measures Outcome: Progressing   Problem: Health Behavior/Discharge Planning: Goal: Ability to manage health-related needs will improve Outcome: Progressing   Problem: Clinical Measurements: Goal: Ability to maintain clinical measurements within normal limits will improve Outcome: Progressing Goal: Will remain free from infection Outcome: Progressing Goal: Diagnostic test results will improve Outcome: Progressing Goal: Respiratory complications will improve Outcome: Progressing Goal: Cardiovascular complication will be avoided Outcome: Progressing   Problem: Activity: Goal: Risk for activity intolerance will decrease Outcome: Progressing   Problem: Nutrition: Goal: Adequate nutrition will be maintained Outcome: Progressing   Problem: Coping: Goal: Level of anxiety will decrease Outcome: Progressing   Problem: Elimination: Goal: Will not experience complications related to bowel motility Outcome: Progressing Goal: Will not experience complications related to urinary retention Outcome: Progressing   Problem: Pain Managment: Goal: General experience of comfort will improve and/or be controlled Outcome: Progressing   Problem: Safety: Goal: Ability to remain free from injury will improve Outcome: Progressing   Problem: Skin Integrity: Goal: Risk for impaired skin integrity will decrease Outcome: Progressing   Problem: Education: Goal: Knowledge of condition will improve Outcome: Progressing Goal: Individualized Educational Video(s) Outcome: Progressing Goal: Individualized Newborn Educational Video(s) Outcome: Progressing   Problem: Activity: Goal: Will verbalize the importance of balancing activity with adequate rest periods Outcome: Progressing Goal: Ability to tolerate increased  activity will improve Outcome: Progressing   Problem: Coping: Goal: Ability to identify and utilize available resources and services will improve Outcome: Progressing   Problem: Life Cycle: Goal: Chance of risk for complications during the postpartum period will decrease Outcome: Progressing   Problem: Role Relationship: Goal: Ability to demonstrate positive interaction with newborn will improve Outcome: Progressing   Problem: Skin Integrity: Goal: Demonstration of wound healing without infection will improve Outcome: Progressing   Problem: Education: Goal: Knowledge of the prescribed therapeutic regimen will improve Outcome: Progressing Goal: Understanding of sexual limitations or changes related to disease process or condition will improve Outcome: Progressing Goal: Individualized Educational Video(s) Outcome: Progressing   Problem: Self-Concept: Goal: Communication of feelings regarding changes in body function or appearance will improve Outcome: Progressing   Problem: Skin Integrity: Goal: Demonstration of wound healing without infection will improve Outcome: Progressing
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
## 2024-04-08 LAB — BIRTH TISSUE RECOVERY COLLECTION (PLACENTA DONATION)

## 2024-04-08 MED ORDER — ACETAMINOPHEN 500 MG PO TABS
1000.0000 mg | ORAL_TABLET | Freq: Four times a day (QID) | ORAL | Status: AC
Start: 2024-04-08 — End: ?

## 2024-04-08 MED ORDER — IBUPROFEN 600 MG PO TABS: 600.0000 mg | ORAL_TABLET | Freq: Four times a day (QID) | ORAL | 0 refills | Status: AC

## 2024-04-08 MED ORDER — MAGNESIUM OXIDE -MG SUPPLEMENT 400 (240 MG) MG PO TABS: 400.0000 mg | ORAL_TABLET | Freq: Every day | ORAL | 2 refills | Status: AC

## 2024-04-08 MED ORDER — POLYSACCHARIDE IRON COMPLEX 150 MG PO CAPS: 150.0000 mg | ORAL_CAPSULE | Freq: Every day | ORAL | 2 refills | Status: AC

## 2024-04-08 MED ORDER — OXYCODONE HCL 5 MG PO TABS: 5.0000 mg | ORAL_TABLET | Freq: Four times a day (QID) | ORAL | 0 refills | Status: AC | PRN

## 2024-04-08 MED ORDER — POLYSACCHARIDE IRON COMPLEX 150 MG PO CAPS
150.0000 mg | ORAL_CAPSULE | Freq: Every day | ORAL | Status: DC
  Administered 2024-04-08: 150 mg via ORAL
  Filled 2024-04-08: qty 1

## 2024-04-08 MED ORDER — MAGNESIUM OXIDE -MG SUPPLEMENT 400 (240 MG) MG PO TABS
400.0000 mg | ORAL_TABLET | Freq: Every day | ORAL | Status: DC
  Administered 2024-04-08: 400 mg via ORAL
  Filled 2024-04-08: qty 1

## 2024-04-08 NOTE — Lactation Note (Signed)
 This note was copied from a baby's chart. Lactation Consultation Note  Patient Name: Rose Manning Date: 04/08/2024 Age:35 hours Reason for consult: Follow-up assessment;Infant weight loss;Term Per mom the baby recently fed and supplemented due to the weight loss.  Per mom also prior to supplementing has been latching on both breast with swallows.  LC reminded mom since she pumped x 8 momths with her 2nd baby her milk should come in quicker and the volume can be higher.  If the breast are over full when the milk comes in hand express, pre- pump off the fullness and then latch. Baby will be able to handle the flow easier.  LC reviewed engorgement prevention and tx, and Lc resources.   Maternal Data Does the patient have breastfeeding experience prior to this delivery?: Yes  Feeding Mother's Current Feeding Choice: Breast Milk and Formula Nipple Type: Slow - flow  LATCH Score - 9    Lactation Tools Discussed/Used Tools: Pump (per mom already has been given a hand pump) Breast pump type: Manual Pump Education: Milk Storage;Setup, frequency, and cleaning  Interventions  Education   Discharge Discharge Education: Engorgement and breast care;Warning signs for feeding baby Pump: Personal;Manual;Hands Free  Consult Status Consult Status: Complete Date: 04/08/24    Rose Manning 04/08/2024, 12:21 PM

## 2024-04-08 NOTE — Progress Notes (Signed)
 Subjective: POD# 2 Information for the patient's newborn:  Rose Manning, Rose Manning [[id number]]  female  Rose Manning  Reports feeling good Feeding: breast and formula Reports tolerating PO and denies N/V, foley removed, ambulating and urinating w/o difficulty  Pain controlled with PO meds Denies HA/SOB/dizziness  Passing flatus: Yes Vaginal bleeding is normal, no clots     Objective:  VS:  Vitals:   04/07/24 0530 04/07/24 1436 04/07/24 2039 04/08/24 0540  BP: 103/68 107/63 112/62 120/76  Pulse: (!) 41 70 67 (!) 57  Resp: 18 18 19 16   Temp: 97.9 F (36.6 C) 98.3 F (36.8 C) 98.2 F (36.8 C)   TempSrc: Oral Oral Oral   SpO2: 96% 99% 99% 98%  Weight:      Height:        Intake/Output Summary (Last 24 hours) at 04/08/2024 0934 Last data filed at 04/07/2024 1524 Gross per 24 hour  Intake 1077.52 ml  Output --  Net 1077.52 ml     Recent Labs    04/07/24 0417  WBC 10.4  HGB 8.4*  HCT 26.3*  PLT 147*    Blood type: --/--/O POS (11/14 0955) Rubella: Immune (05/05 0000)    Physical Exam:  General: alert, cooperative, and no distress CV: Regular rate and rhythm or without murmur or extra heart sounds Resp: clear Abdomen: soft, nontender, normal bowel sounds Incision: clean, dry, and intact Uterine Fundus: firm, below umbilicus, nontender Lochia: minimal Ext: no edema, negative for tenderness, pain, and cords   Assessment/Plan: 33 y.o.   POD# 2. [id number]                  Principal Problem:   Postpartum care following cesarean delivery 11/17 Active Problems:   Morbid obesity (HCC)   Herpes simplex   Iron  deficiency anemia, unspecified   History of cesarean section   Status post repeat low transverse cesarean section   S/P tubal ligation   S/P repeat low transverse C-section   Routine post-op PP care          Advance diet as tolerated Advised warm fluids and ambulation to improve GI motility Lactation support PRN Contraception:  sterilization Anticipate D/C POD 2 or 3 dependent of newborn weight loss  Rose KATHEE Peal, DNP, CNM 04/08/2024, 9:34 AM

## 2024-04-08 NOTE — Discharge Summary (Signed)
 Postpartum Discharge Summary  Date of Service updated 04/08/24    Patient Name: Rose Manning DOB: May 04, 1991 MRN: [id number]  Date of admission: 04/06/2024 Delivery date:04/06/2024 Delivering provider: HENRY SLOUGH Date of discharge: 04/08/2024  Admitting diagnosis: Status post repeat low transverse cesarean section [Z98.891] History of cesarean section [Z98.891] S/P repeat low transverse C-section [S01.108] Intrauterine pregnancy: [redacted]w[redacted]d     Secondary diagnosis:  Principal Problem:   Postpartum care following cesarean delivery 11/17 Active Problems:   Morbid obesity (HCC)   Herpes simplex   Iron  deficiency anemia, unspecified   History of cesarean section   Status post repeat low transverse cesarean section   S/P tubal ligation   S/P repeat low transverse C-section  Additional problems: None    Discharge diagnosis: Term Pregnancy Delivered                                              Post partum procedures:none Augmentation: N/A Complications: None  Hospital course: Scheduled C/S   33 y.o. yo [id number] at [redacted]w[redacted]d was admitted to the hospital 04/06/2024 for scheduled cesarean section with the following indication:Elective Repeat.Delivery details are as follows:  Membrane Rupture Time/Date: 11:06 AM,04/06/2024  Delivery Method:C-Section, Low Transverse Operative Delivery:N/A Details of operation can be found in separate operative note.  Patient had an uncomplicated postpartum course.  She is ambulating, tolerating a regular diet, passing flatus, and urinating well. Patient is discharged home in stable condition on  04/08/24        Newborn Data: Birth date:04/[birth year]/2024 Birth time:11:17 AM Gender:Female Living status:Living Apgars:9 ,9  Weight:3410 g    Magnesium  Sulfate received: No BMZ received: No Rhophylac:N/A MMR:N/A Transfusion:No Immunizations administered: Immunization History  Administered Date(s) Administered   Influenza,inj,Quad PF,6+ Mos  06/22/2016   MMR 03/26/2013, 12/31/2019   Tdap 06/18/2016, 10/22/2019    Physical exam  Vitals:   04/07/24 0530 04/07/24 1436 04/07/24 2039 04/08/24 0540  BP: 103/68 107/63 112/62 120/76  Pulse: (!) 41 70 67 (!) 57  Resp: 18 18 19 16   Temp: 97.9 F (36.6 C) 98.3 F (36.8 C) 98.2 F (36.8 C)   TempSrc: Oral Oral Oral   SpO2: 96% 99% 99% 98%  Weight:      Height:       General: alert, cooperative, and no distress Lochia: appropriate Uterine Fundus: firm Incision: Healing well with no significant drainage, Dressing is clean, dry, and intact DVT Evaluation: No evidence of DVT seen on physical exam. No cords or calf tenderness. No significant calf/ankle edema. Labs: Lab Results  Component Value Date   WBC 10.4 04/07/2024   HGB 8.4 (L) 04/07/2024   HCT 26.3 (L) 04/07/2024   MCV 79.5 (L) 04/07/2024   PLT 147 (L) 04/07/2024      Latest Ref Rng & Units 02/05/2022    2:13 AM  CMP  Glucose 70 - 99 mg/dL 897   BUN 6 - 20 mg/dL 15   Creatinine 9.55 - 1.00 mg/dL 9.14   Sodium 864 - 854 mmol/L 145   Potassium 3.5 - 5.1 mmol/L 4.1   Chloride 98 - 111 mmol/L 111   CO2 22 - 32 mmol/L 27   Calcium  8.9 - 10.3 mg/dL 9.1    Edinburgh Score:    04/07/2024    2:47 PM  Edinburgh Postnatal Depression Scale Screening Tool  I have been able to laugh  and see the funny side of things. 0  I have looked forward with enjoyment to things. 0  I have blamed myself unnecessarily when things went wrong. 2  I have been anxious or worried for no good reason. 2  I have felt scared or panicky for no good reason. 2  Things have been getting on top of me. 1  I have been so unhappy that I have had difficulty sleeping. 0  I have felt sad or miserable. 1  I have been so unhappy that I have been crying. 1  The thought of harming myself has occurred to me. 0  Edinburgh Postnatal Depression Scale Total 9      After visit meds:  Allergies as of 04/08/2024       Reactions   Shellfish Allergy  Anaphylaxis   Pt states was taken to ED and given epipen    Fluoxetine Other (See Comments)   Made depression worse        Medication List     TAKE these medications    acetaminophen  500 MG tablet Commonly known as: TYLENOL  Take 2 tablets (1,000 mg total) by mouth every 6 (six) hours.   FUSION PLUS PO Take 1 tablet by mouth daily.   ibuprofen  600 MG tablet Commonly known as: ADVIL  Take 1 tablet (600 mg total) by mouth every 6 (six) hours.   iron  polysaccharides 150 MG capsule Commonly known as: NIFEREX Take 1 capsule (150 mg total) by mouth daily. Start taking on: April 09, 2024   magnesium  oxide 400 (240 Mg) MG tablet Commonly known as: MAG-OX Take 1 tablet (400 mg total) by mouth daily. Start taking on: April 09, 2024   oxyCODONE  5 MG immediate release tablet Commonly known as: Oxy IR/ROXICODONE  Take 1 tablet (5 mg total) by mouth every 6 (six) hours as needed for up to 5 days for moderate pain (pain score 4-6) or severe pain (pain score 7-10).   prenatal multivitamin Tabs tablet Take 1 tablet by mouth daily at 12 noon.               Discharge Care Instructions  (From admission, onward)           Start     Ordered   04/08/24 0000  Discharge wound care:       Comments: Take dressing off on 04/11/24, remove it sooner if it is dirty or damaged. Clean area with soap and water  and pat dry. You can leave the steri strips on until they fall off or take them off gently by 04/17/24. Call the office for increased drainage, redness, pain, or warmth. Keep the incision area clean and dry at all times.   04/08/24 1339             Discharge home in stable condition Infant Feeding: Breast and formula Infant Disposition:home with mother Discharge instruction: per After Visit Summary and Postpartum booklet. Activity: Advance as tolerated. Pelvic rest for 6 weeks.  Diet: low salt diet Anticipated Birth Control: bilateral salpingectomy Postpartum  Appointment:6 weeks Additional Postpartum F/U: none Future Appointments:No future appointments. Follow up Visit:  Follow-up Information     Henry Slough, MD. Schedule an appointment as soon as possible for a visit in 6 week(s).   Specialty: Obstetrics and Gynecology Contact information: [address] [place] [address] [phone]                     04/08/2024 Mercer KATHEE Peal, CNM

## 2024-04-10 LAB — SURGICAL PATHOLOGY

## 2024-04-12 ENCOUNTER — Inpatient Hospital Stay (HOSPITAL_COMMUNITY)
Admission: AD | Admit: 2024-04-12 | Discharge: 2024-04-14 | DRG: 776 | Disposition: A | Discharge status: Home / Self Care | Attending: Obstetrics and Gynecology | Admitting: Obstetrics and Gynecology

## 2024-04-12 ENCOUNTER — Inpatient Hospital Stay (HOSPITAL_COMMUNITY)

## 2024-04-12 DIAGNOSIS — Z8249 Family history of ischemic heart disease and other diseases of the circulatory system: Secondary | ICD-10-CM | POA: Diagnosis not present

## 2024-04-12 DIAGNOSIS — Z833 Family history of diabetes mellitus: Secondary | ICD-10-CM

## 2024-04-12 DIAGNOSIS — O1415 Severe pre-eclampsia, complicating the puerperium: Principal | ICD-10-CM | POA: Diagnosis present

## 2024-04-12 DIAGNOSIS — O1495 Unspecified pre-eclampsia, complicating the puerperium: Secondary | ICD-10-CM | POA: Diagnosis present

## 2024-04-12 DIAGNOSIS — Z98891 History of uterine scar from previous surgery: Secondary | ICD-10-CM

## 2024-04-12 LAB — COMPREHENSIVE METABOLIC PANEL WITH GFR
ALT: 19 U/L (ref 0–44)
AST: 26 U/L (ref 15–41)
Albumin: 2.3 g/dL — ABNORMAL LOW (ref 3.5–5.0)
Alkaline Phosphatase: 89 U/L (ref 38–126)
Anion gap: 11 (ref 5–15)
BUN: 5 mg/dL — ABNORMAL LOW (ref 6–20)
CO2: 23 mmol/L (ref 22–32)
Calcium: 8.4 mg/dL — ABNORMAL LOW (ref 8.9–10.3)
Chloride: 106 mmol/L (ref 98–111)
Creatinine, Ser: 0.59 mg/dL (ref 0.44–1.00)
GFR, Estimated: >60 mL/min (ref >60)
Glucose, Bld: 63 mg/dL — ABNORMAL LOW (ref 70–99)
Potassium: 3.7 mmol/L (ref 3.5–5.1)
Sodium: 140 mmol/L (ref 135–145)
Total Bilirubin: 0.4 mg/dL (ref 0.0–1.2)
Total Protein: 5.5 g/dL — ABNORMAL LOW (ref 6.5–8.1)

## 2024-04-12 LAB — TYPE AND SCREEN
ABO/RH(D): O POS
Antibody Screen: NEGATIVE

## 2024-04-12 LAB — CBC
HCT: 28.9 % — ABNORMAL LOW (ref 36.0–46.0)
Hemoglobin: 9.3 g/dL — ABNORMAL LOW (ref 12.0–15.0)
MCH: 26.1 pg (ref 26.0–34.0)
MCHC: 32.2 g/dL (ref 30.0–36.0)
MCV: 81.2 fL (ref 80.0–100.0)
Platelets: 195 K/uL (ref 150–400)
RBC: 3.56 MIL/uL — ABNORMAL LOW (ref 3.87–5.11)
WBC: 4.5 K/uL (ref 4.0–10.5)
nRBC: 0 % (ref 0.0–0.2)

## 2024-04-12 LAB — BRAIN NATRIURETIC PEPTIDE: B Natriuretic Peptide: 118.1 pg/mL — ABNORMAL HIGH (ref 0.0–100.0)

## 2024-04-12 LAB — LACTATE DEHYDROGENASE: LDH: 146 U/L (ref 105–235)

## 2024-04-12 LAB — PROTEIN / CREATININE RATIO, URINE
Creatinine, Urine: 45 mg/dL
Protein Creatinine Ratio: 0.22 mg/mg{creat} — ABNORMAL HIGH (ref 0.00–0.15)
Total Protein, Urine: 10 mg/dL

## 2024-04-12 LAB — URIC ACID: Uric Acid, Serum: 4.3 mg/dL (ref 2.5–7.1)

## 2024-04-12 LAB — TROPONIN I (HIGH SENSITIVITY)
Troponin I (High Sensitivity): 5 ng/L (ref <18)
Troponin I (High Sensitivity): 4 ng/L (ref <18)

## 2024-04-12 MED ORDER — KETOROLAC TROMETHAMINE 30 MG/ML IJ SOLN
30.0000 mg | Freq: Four times a day (QID) | INTRAMUSCULAR | Status: DC | PRN
  Administered 2024-04-12 – 2024-04-14 (×3): 30 mg via INTRAVENOUS
  Filled 2024-04-12 (×3): qty 1

## 2024-04-12 MED ORDER — CYCLOBENZAPRINE HCL 5 MG PO TABS
10.0000 mg | ORAL_TABLET | Freq: Once | ORAL | Status: AC
  Administered 2024-04-12: 10 mg via ORAL
  Filled 2024-04-12: qty 2

## 2024-04-12 MED ORDER — BUTALBITAL-APAP-CAFFEINE 50-325-40 MG PO TABS
1.0000 | ORAL_TABLET | Freq: Four times a day (QID) | ORAL | Status: DC | PRN
  Administered 2024-04-12 – 2024-04-13 (×2): 1 via ORAL
  Administered 2024-04-14: 2 via ORAL
  Administered 2024-04-14: 1 via ORAL
  Filled 2024-04-12 (×3): qty 1
  Filled 2024-04-12: qty 2

## 2024-04-12 MED ORDER — LACTATED RINGERS IV SOLN: INTRAVENOUS | Status: AC

## 2024-04-12 MED ORDER — HYDRALAZINE HCL 20 MG/ML IJ SOLN: 5.0000 mg | INTRAMUSCULAR | Status: DC | PRN

## 2024-04-12 MED ORDER — LABETALOL HCL 5 MG/ML IV SOLN: 40.0000 mg | INTRAVENOUS | Status: DC | PRN

## 2024-04-12 MED ORDER — ACETAMINOPHEN 325 MG PO TABS
650.0000 mg | ORAL_TABLET | ORAL | Status: DC | PRN
  Administered 2024-04-12 (×2): 650 mg via ORAL
  Filled 2024-04-12 (×2): qty 2

## 2024-04-12 MED ORDER — OXYCODONE HCL 5 MG PO TABS
5.0000 mg | ORAL_TABLET | Freq: Four times a day (QID) | ORAL | Status: DC | PRN
  Administered 2024-04-12: 5 mg via ORAL
  Filled 2024-04-12: qty 1

## 2024-04-12 MED ORDER — BUTALBITAL-APAP-CAFFEINE 50-325-40 MG PO TABS
2.0000 | ORAL_TABLET | Freq: Once | ORAL | Status: AC
  Administered 2024-04-12: 2 via ORAL
  Filled 2024-04-12: qty 2

## 2024-04-12 MED ORDER — CALCIUM CARBONATE ANTACID 500 MG PO CHEW
2.0000 | CHEWABLE_TABLET | ORAL | Status: DC | PRN
Start: 2024-04-12 — End: 2024-04-14

## 2024-04-12 MED ORDER — LABETALOL HCL 5 MG/ML IV SOLN
20.0000 mg | INTRAVENOUS | Status: DC | PRN
Start: 2024-04-12 — End: 2024-04-14

## 2024-04-12 MED ORDER — MAGNESIUM SULFATE BOLUS VIA INFUSION
4.0000 g | Freq: Once | INTRAVENOUS | Status: AC
  Administered 2024-04-12: 4 g via INTRAVENOUS
  Filled 2024-04-12: qty 1000

## 2024-04-12 MED ORDER — DOCUSATE SODIUM 100 MG PO CAPS
100.0000 mg | ORAL_CAPSULE | Freq: Every day | ORAL | Status: DC
  Administered 2024-04-12 – 2024-04-14 (×3): 100 mg via ORAL
  Filled 2024-04-12 (×3): qty 1

## 2024-04-12 MED ORDER — PRENATAL MULTIVITAMIN CH
1.0000 | ORAL_TABLET | Freq: Every day | ORAL | Status: DC
  Administered 2024-04-12 – 2024-04-14 (×3): 1 via ORAL
  Filled 2024-04-12 (×3): qty 1

## 2024-04-12 MED ORDER — HYDRALAZINE HCL 20 MG/ML IJ SOLN: 10.0000 mg | INTRAMUSCULAR | Status: DC | PRN

## 2024-04-12 MED ORDER — KETOROLAC TROMETHAMINE 30 MG/ML IJ SOLN
30.0000 mg | Freq: Once | INTRAMUSCULAR | Status: AC
  Administered 2024-04-12: 30 mg via INTRAVENOUS
  Filled 2024-04-12: qty 1

## 2024-04-12 MED ORDER — MAGNESIUM SULFATE 40 GM/1000ML IV SOLN
2.0000 g/h | INTRAVENOUS | Status: AC
  Administered 2024-04-12 – 2024-04-13 (×2): 2 g/h via INTRAVENOUS
  Filled 2024-04-12 (×2): qty 1000

## 2024-04-12 NOTE — Plan of Care (Signed)
  Problem: Education: Goal: Knowledge of disease or condition will improve Outcome: Progressing Goal: Knowledge of the prescribed therapeutic regimen will improve Outcome: Progressing Goal: Individualized Educational Video(s) Outcome: Progressing   Problem: Clinical Measurements: Goal: Complications related to the disease process, condition or treatment will be avoided or minimized Outcome: Progressing   Problem: Education: Goal: Knowledge of disease or condition will improve Outcome: Progressing Goal: Knowledge of the prescribed therapeutic regimen will improve Outcome: Progressing   Problem: Fluid Volume: Goal: Peripheral tissue perfusion will improve Outcome: Progressing   Problem: Clinical Measurements: Goal: Complications related to disease process, condition or treatment will be avoided or minimized Outcome: Progressing   Problem: Education: Goal: Knowledge of General Education information will improve Description: Including pain rating scale, medication(s)/side effects and non-pharmacologic comfort measures Outcome: Progressing   Problem: Health Behavior/Discharge Planning: Goal: Ability to manage health-related needs will improve Outcome: Progressing   Problem: Clinical Measurements: Goal: Ability to maintain clinical measurements within normal limits will improve Outcome: Progressing Goal: Will remain free from infection Outcome: Progressing Goal: Diagnostic test results will improve Outcome: Progressing Goal: Respiratory complications will improve Outcome: Progressing Goal: Cardiovascular complication will be avoided Outcome: Progressing   Problem: Activity: Goal: Risk for activity intolerance will decrease Outcome: Progressing   Problem: Nutrition: Goal: Adequate nutrition will be maintained Outcome: Progressing   Problem: Coping: Goal: Level of anxiety will decrease Outcome: Progressing   Problem: Elimination: Goal: Will not experience  complications related to bowel motility Outcome: Progressing Goal: Will not experience complications related to urinary retention Outcome: Progressing   Problem: Pain Managment: Goal: General experience of comfort will improve and/or be controlled Outcome: Progressing   Problem: Safety: Goal: Ability to remain free from injury will improve Outcome: Progressing   Problem: Skin Integrity: Goal: Risk for impaired skin integrity will decrease Outcome: Progressing

## 2024-04-12 NOTE — H&P (Addendum)
 Rose Manning is a 33 y.o. female, [id number], S/P LTCS with sterilization on the 11/17 now POD# 6, presenting for postpartum preE with severe features, HA. Elevated BP. HA not resolved with motrin  or position changes. H/O Bp issues with past pregnancy, but no this one.  Patient Active Problem List   Diagnosis Date Noted   Preeclampsia in postpartum period 04/12/2024   History of cesarean section 04/06/2024   Status post repeat low transverse cesarean section 04/06/2024   S/P tubal ligation 04/06/2024   Postpartum care following cesarean delivery 11/17 04/06/2024   S/P repeat low transverse C-section 04/06/2024   Iron  deficiency anemia, unspecified 03/03/2024   Herpes simplex 12/06/2022   Morbid obesity (HCC) 09/30/2013     Active Ambulatory Problems    Diagnosis Date Noted   Morbid obesity (HCC) 09/30/2013   Herpes simplex 12/06/2022   Iron  deficiency anemia, unspecified 03/03/2024   History of cesarean section 04/06/2024   Status post repeat low transverse cesarean section 04/06/2024   S/P tubal ligation 04/06/2024   Postpartum care following cesarean delivery 11/17 04/06/2024   S/P repeat low transverse C-section 04/06/2024   Resolved Ambulatory Problems    Diagnosis Date Noted   Elevated blood pressure reading without diagnosis of hypertension 05/11/2019   Intrauterine pregnancy 05/11/2019   Subchorionic hematoma in first trimester 05/11/2019   Polyhydramnios 12/27/2019   Postpartum care following cesarean delivery 8/9 12/29/2019   Status post primary low transverse cesarean section 8/9 12/29/2019   Maternal anemia, with delivery 12/29/2019   Rubella nonimmune status, delivered, current hospitalization 12/29/2019   History of shoulder dystocia in prior pregnancy, currently pregnant, second trimester 06/19/2019   History of gestational hypertension 06/19/2019   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 30 tablet 0 04/12/2024 at  5:00 AM   Iron -FA-B Cmp-C-Biot-Probiotic (FUSION PLUS PO) Take 1 tablet by mouth daily.   04/11/2024 at  8:00 AM   magnesium  oxide (MAG-OX) 400 (240 Mg) MG tablet Take 1 tablet (400 mg total) by mouth daily. 30 tablet 2 04/11/2024 at  8:00 AM   oxyCODONE  (OXY IR/ROXICODONE ) 5 MG immediate release tablet Take 1 tablet (5 mg total) by mouth every 6 (six) hours as needed for up to 5 days for moderate pain (pain score 4-6) or severe pain (pain score 7-10). 20 tablet 0 04/11/2024 at  6:00 PM   Prenatal Vit-Fe Fumarate-FA (PRENATAL MULTIVITAMIN) TABS tablet Take 1 tablet by mouth daily at 12 noon.   04/11/2024 at  8:00 AM   acetaminophen  (TYLENOL ) 500 MG tablet Take 2 tablets (1,000 mg total) by mouth every 6 (six) hours.      iron  polysaccharides (NIFEREX) 150 MG capsule Take 1 capsule (150 mg total) by mouth daily. 30 capsule 2     Past Medical History:  Diagnosis Date   Anemia    Anxiety    Depression    History of gestational hypertension    History of kidney stones    Hx of varicella    Migraine    Obesity    Polyhydramnios 12/27/2019   Pregnancy induced hypertension    Thyroid cyst      No current facility-administered medications on file prior to encounter.   Current Outpatient Medications on File Prior to Encounter  Medication Sig Dispense Refill   ibuprofen  (ADVIL ) 600 MG tablet Take 1 tablet (600 mg total) by mouth every 6 (six)  hours. 30 tablet 0   Iron -FA-B Cmp-C-Biot-Probiotic (FUSION PLUS PO) Take 1 tablet by mouth daily.     magnesium  oxide (MAG-OX) 400 (240 Mg) MG tablet Take 1 tablet (400 mg total) by mouth daily. 30 tablet 2   oxyCODONE  (OXY IR/ROXICODONE ) 5 MG immediate release tablet Take 1 tablet (5 mg total) by mouth every 6 (six) hours as needed for up to 5 days for moderate pain (pain score 4-6) or severe pain (pain score 7-10). 20 tablet 0   Prenatal Vit-Fe Fumarate-FA (PRENATAL MULTIVITAMIN) TABS tablet Take 1 tablet by mouth daily at 12 noon.     acetaminophen  (TYLENOL ) 500 MG tablet Take 2 tablets (1,000 mg total) by mouth every 6 (six) hours.     iron  polysaccharides (NIFEREX) 150 MG capsule Take 1 capsule (150 mg total) by mouth daily. 30 capsule 2     Allergies  Allergen Reactions   Shellfish Allergy Anaphylaxis    Pt states was taken to ED and given epipen    Fluoxetine Other (See Comments)    Made depression worse    OB History     Gravida  7   Para  3   Term  3   Preterm      AB  4   Living  3      SAB  3   IAB  1   Ectopic      Multiple  0   Live Births  3          Past Medical History:  Diagnosis Date   Anemia    Anxiety    Depression    History of gestational hypertension    History of kidney stones    Hx of varicella    Migraine    Obesity    Polyhydramnios 12/27/2019   Pregnancy induced hypertension    Thyroid cyst    Past Surgical History:  Procedure Laterality Date   BARIATRIC SURGERY     CESAREAN SECTION N/A 12/28/2019   Procedure: CESAREAN SECTION;  Surgeon: Armond Cape, MD;  Location: [facility];  Service: Obstetrics;  Laterality: N/A;   CESAREAN SECTION WITH BILATERAL TUBAL LIGATION N/A 04/06/2024   Procedure: CESAREAN SECTION, WITH BILATERAL TUBAL LIGATION;  Surgeon: Henry Slough, MD;  Location: [facility];  Service: Obstetrics;  Laterality: N/A;   CHOLECYSTECTOMY  01/2009   DILATION AND CURETTAGE OF UTERUS  2019   D&E   DILATION  AND EVACUATION Bilateral 08/14/2017   Procedure: Suction DILATATION AND EVACUATION;  Surgeon: Gloriann Chick, MD;  Location: [facility];  Service: Gynecology;  Laterality: Bilateral;   VULVECTOMY N/A 02/02/2022   Procedure: VULVECTOMY;  Surgeon: Gloriann Chick, MD;  Location: [facility];  Service: Gynecology;  Laterality: N/A;   WISDOM TOOTH EXTRACTION     Family History: family history includes Alzheimer's disease in her maternal grandfather; Diabetes in her cousin; Heart disease in  her paternal grandfather; Hypertension in her father, maternal grandfather, maternal grandmother, mother, paternal grandfather, and paternal grandmother; Lupus in her maternal aunt; Migraines in her mother; Rheum arthritis in her maternal aunt; Urolithiasis in her mother. Social History:  reports that she has never smoked. She has never used smokeless tobacco. She reports that she does not drink alcohol and does not use drugs.   ROS:  Review of Systems  Constitutional: Negative.   HENT: Negative.    Eyes: Negative.   Respiratory: Negative.    Cardiovascular:  Positive for chest pain.       Only when breathing in   Gastrointestinal: Negative.   Genitourinary: Negative.   Musculoskeletal: Negative.   Skin: Negative.   Neurological:  Positive for headaches.       Laying back or sitting up does not help, motrin  has not helped  Endo/Heme/Allergies: Negative.   Psychiatric/Behavioral: Negative.       Physical Exam: BP (!) 159/90 (BP Location: Right Arm)   Pulse (!) 56   Temp 98.3 F (36.8 C) (Oral)   Resp 16   SpO2 99%   Physical Exam Vitals and nursing note reviewed.  Constitutional:      Appearance: She is well-developed.  HENT:     Head: Normocephalic and atraumatic.     Mouth/Throat:     Mouth: Mucous membranes are moist.  Eyes:     Extraocular Movements: Extraocular movements intact.  Cardiovascular:     Rate and Rhythm: Normal rate and regular rhythm.     Heart sounds: Normal heart sounds.  Pulmonary:      Effort: Pulmonary effort is normal.  Abdominal:     General: Bowel sounds are normal.     Palpations: Abdomen is soft.  Musculoskeletal:        General: Normal range of motion.     Cervical back: Normal range of motion.  Skin:    General: Skin is warm and dry.     Capillary Refill: Capillary refill takes less than 2 seconds.  Neurological:     Mental Status: She is alert and oriented to person, place, and time.  Psychiatric:        Mood and Affect: Mood normal.     Comments: Eyes closed with pain from HA       Labs: No results found for this or any previous visit (from the past 24 hours).  Imaging:  No results found.  MAU Course: Orders Placed This Encounter  Procedures   DG Chest 2 View   CBC   Comprehensive metabolic panel with GFR   Brain natriuretic peptide   Protein / creatinine ratio, urine   Lactate dehydrogenase   Uric acid   Diet regular Room service appropriate? Yes; Fluid consistency: Thin   Notify physician (specify) Confirmatory reading of BP> 160/110 15 minutes later   Apply Hypertensive Disorders of Pregnancy Care Plan   Notify physician (specify)   Vital signs   Defer vaginal exam for vaginal bleeding or PROM <37 weeks   Apply Antepartum Care Plan   Initiate Oral Care Protocol   Initiate Carrier Fluid Protocol   Measure blood pressure   Strict intake and output   SCDs   Bed rest with bathroom privileges   Vital signs   Full code   EKG 12-Lead   Type and screen [facility]   Insert peripheral IV   Admit to Inpatient (patient's expected length of stay will be greater than 2 midnights or inpatient only  procedure)   Meds ordered this encounter  Medications   butalbital -acetaminophen -caffeine  (FIORICET ) 50-325-40 MG per tablet 2 tablet   cyclobenzaprine  (FLEXERIL ) tablet 10 mg   acetaminophen  (TYLENOL ) tablet 650 mg   docusate sodium  (COLACE) capsule 100 mg   calcium  carbonate (TUMS - dosed in mg elemental calcium ) chewable  tablet 400 mg of elemental calcium    prenatal multivitamin tablet 1 tablet   AND Linked Order Group    hydrALAZINE  (APRESOLINE ) injection 5 mg    hydrALAZINE  (APRESOLINE ) injection 10 mg    labetalol  (NORMODYNE ) injection 20 mg    labetalol  (NORMODYNE ) injection 40 mg   magnesium  bolus via infusion 4 g   magnesium  sulfate 40 grams in SWI 1000 mL OB infusion   lactated ringers  infusion    Assessment/Plan: Rose Manning is a 33 y.o. female, [id number], S/P LTCS with sterilization on the 11/17 now POD# 6, presenting for postpartum preE with severe features, HA. Elevated BP. HA not resolved with motrin  or position changes. H/O Bp issues with past pregnancy, but no this one.   Test to consider: CTA of chest-with shared decision making this will be deferred, as my clinical suspicion for PE is low at this time, patient is not endorsing pleuritic chest pain or shortness of breath, no stabbing-like pain, there is been no long immobilization, she did have surgery but she was discharged not been bedbound, she has no history of PEs or DVTs, vital signs are reassuring she is nontachypneic nonhypoxic, BNP slightly elevated 118, there is no elevation in troponins, EKG sinus brady rhythm, neg chest x-ray  Plan: Admit to OBS for PP preE w/SF as consulted with DR Armond Routine Ante [facility] orders Start Mag 4gm bolus then 2gm/hr, repeat CBC, CMP in morning with mag level IV hydralazine  for SR Bps Tylenol  for HA with flexeril /Oxy IR for incisional pain SCD Bedrest Strict IO BP Q4H  Regular diet.   Nallely Yost  CNM, FNP-C, PMHNP-BC  [address]  [place], [place] [address]  Cell: [phone]  Office Phone: [phone] Fax: [phone] 04/12/2024  11:13 AM

## 2024-04-12 NOTE — MAU Note (Addendum)
 Rose Manning is a 33 y.o. female   Postpartum Day 6 here in MAU reporting headache of 1 day starting yesterday at11/23/2025 0400am. Took 600mg  ibuprofen  at 05:00 am this morning with minimal relief.Also reports left sided facial pain, visual disturbances (seeing floaters) and swelling of both legs that come and goes but started 2 days ago. No hx of chtn, ghtn, or pre.   Onset of complaint: Headache Pain score: 8 Blood pressure (!) 159/90, pulse (!) 56, temperature 98.3 F (36.8 C), temperature source Oral, resp. rate 16, SpO2 99%, unknown if currently breastfeeding.

## 2024-04-12 NOTE — MAU Provider Note (Signed)
 History     CSN: [id number]  Arrival date and time: 04/12/24 9071   Event Date/Time   First Provider Initiated Contact with Patient 04/12/24 1034      Chief Complaint  Patient presents with   Headache   HPI  Ms.Rose Manning is a 33 y.o. female [id number] status post repeat C/s and bilateral salpingectomy presenting to MAU with new onset elevated home BP readings, ha and vision changes.  Symptoms started yesterday morning.   No history of HTN during this pregnancy or any past pregnancy. BP's were grossly normal at the time of DC home.   She took oxycodone  and ibuprofen  for HA and no relief. She currently rates her HA 8/10 and reports she has never had a HA like this before.   OB History     Gravida  7   Para  3   Term  3   Preterm      AB  4   Living  3      SAB  3   IAB  1   Ectopic      Multiple  0   Live Births  3           Past Medical History:  Diagnosis Date   Anemia    Anxiety    Depression    History of gestational hypertension    History of kidney stones    Hx of varicella    Migraine    Obesity    Polyhydramnios 12/27/2019   Pregnancy induced hypertension    Thyroid cyst     Past Surgical History:  Procedure Laterality Date   BARIATRIC SURGERY     CESAREAN SECTION N/A 12/28/2019   Procedure: CESAREAN SECTION;  Surgeon: Armond Cape, MD;  Location: [facility];  Service: Obstetrics;  Laterality: N/A;   CESAREAN SECTION WITH BILATERAL TUBAL LIGATION N/A 04/06/2024   Procedure: CESAREAN SECTION, WITH BILATERAL TUBAL LIGATION;  Surgeon: Henry Slough, MD;  Location: [facility];  Service: Obstetrics;  Laterality: N/A;   CHOLECYSTECTOMY  01/2009   DILATION AND CURETTAGE OF UTERUS  2019   D&E   DILATION AND EVACUATION Bilateral 08/14/2017   Procedure: Suction DILATATION AND EVACUATION;  Surgeon: Gloriann Chick, MD;  Location: [facility];  Service: Gynecology;  Laterality: Bilateral;   VULVECTOMY N/A 02/02/2022   Procedure: VULVECTOMY;   Surgeon: Gloriann Chick, MD;  Location: [facility];  Service: Gynecology;  Laterality: N/A;   WISDOM TOOTH EXTRACTION      Family History  Problem Relation Age of Onset   Hypertension Mother    Urolithiasis Mother    Migraines Mother    Hypertension Father    Hypertension Maternal Grandmother    Hypertension Maternal Grandfather    Alzheimer's disease Maternal Grandfather    Hypertension Paternal Grandmother    Hypertension Paternal Grandfather    Heart disease Paternal Grandfather    Lupus Maternal Aunt    Rheum arthritis Maternal Aunt    Diabetes Cousin    Anesthesia problems Neg Hx     Social History   Tobacco Use   Smoking status: Never   Smokeless tobacco: Never  Vaping Use   Vaping status: Never Used  Substance Use Topics   Alcohol use: No    Comment: occasionally   Drug use: No    Allergies:  Allergies  Allergen Reactions   Shellfish Allergy Anaphylaxis    Pt states was taken to ED and given epipen    Fluoxetine Other (See Comments)  Made depression worse    Medications Prior to Admission  Medication Sig Dispense Refill Last Dose/Taking   ibuprofen  (ADVIL ) 600 MG tablet Take 1 tablet (600 mg total) by mouth every 6 (six) hours. 30 tablet 0 04/12/2024 at  5:00 AM   Iron -FA-B Cmp-C-Biot-Probiotic (FUSION PLUS PO) Take 1 tablet by mouth daily.   04/11/2024 at  8:00 AM   magnesium  oxide (MAG-OX) 400 (240 Mg) MG tablet Take 1 tablet (400 mg total) by mouth daily. 30 tablet 2 04/11/2024 at  8:00 AM   oxyCODONE  (OXY IR/ROXICODONE ) 5 MG immediate release tablet Take 1 tablet (5 mg total) by mouth every 6 (six) hours as needed for up to 5 days for moderate pain (pain score 4-6) or severe pain (pain score 7-10). 20 tablet 0 04/11/2024 at  6:00 PM   Prenatal Vit-Fe Fumarate-FA (PRENATAL MULTIVITAMIN) TABS tablet Take 1 tablet by mouth daily at 12 noon.   04/11/2024 at  8:00 AM   acetaminophen  (TYLENOL ) 500 MG tablet Take 2 tablets (1,000 mg total) by mouth every 6 (six) hours.       iron  polysaccharides (NIFEREX) 150 MG capsule Take 1 capsule (150 mg total) by mouth daily. 30 capsule 2    Results for orders placed or performed during the hospital encounter of 04/12/24 (from the past 48 hours)  Protein / creatinine ratio, urine     Status: Abnormal   Collection Time: 04/12/24 11:19 AM  Result Value Ref Range   Creatinine, Urine 45 mg/dL   Total Protein, Urine 10 mg/dL    Comment: NO NORMAL RANGE ESTABLISHED FOR THIS TEST   Protein Creatinine Ratio 0.22 (H) 0.00 - 0.15 mg/mg[Cre]    Comment: Performed at [facility], [address] [place], [place] [address]  Brain natriuretic peptide     Status: Abnormal   Collection Time: 04/12/24 11:51 AM  Result Value Ref Range   B Natriuretic Peptide 118.1 (H) 0.0 - 100.0 pg/mL    Comment: Performed at [facility], [address], [place], [place] [address]  Type and screen [facility]     Status: None   Collection Time: 04/12/24 11:51 AM  Result Value Ref Range   ABO/RH(D) O POS    Antibody Screen NEG    Sample Expiration      04/15/2024,2359 Performed at [facility], [address] [place], [place] [address]   CBC     Status: Abnormal   Collection Time: 04/12/24 11:52 AM  Result Value Ref Range   WBC 4.5 4.0 - 10.5 K/uL   RBC 3.56 (L) 3.87 - 5.11 MIL/uL    Comment: REPEATED TO VERIFY   Hemoglobin 9.3 (L) 12.0 - 15.0 g/dL   HCT 71.0 (L) 63.9 - 53.9 %   MCV 81.2 80.0 - 100.0 fL    Comment: REPEATED TO VERIFY   MCH 26.1 26.0 - 34.0 pg   MCHC 32.2 30.0 - 36.0 g/dL   RDW Not Measured 88.4 - 15.5 %   Platelets 195 150 - 400 K/uL   nRBC 0.0 0.0 - 0.2 %    Comment: Performed at [facility], [address] [place], [place] [address]  Comprehensive metabolic panel with GFR     Status: Abnormal   Collection Time: 04/12/24 11:52 AM  Result Value Ref Range   Sodium 140 135 - 145 mmol/L   Potassium 3.7 3.5 - 5.1 mmol/L   Chloride 106 98 - 111 mmol/L  CO2 23 22 - 32  mmol/L   Glucose, Bld 63 (L) 70 - 99 mg/dL    Comment: Glucose reference range applies only to samples taken after fasting for at least 8 hours.   BUN 5 (L) 6 - 20 mg/dL   Creatinine, Ser 9.40 0.44 - 1.00 mg/dL   Calcium  8.4 (L) 8.9 - 10.3 mg/dL   Total Protein 5.5 (L) 6.5 - 8.1 g/dL   Albumin 2.3 (L) 3.5 - 5.0 g/dL   AST 26 15 - 41 U/L   ALT 19 0 - 44 U/L   Alkaline Phosphatase 89 38 - 126 U/L   Total Bilirubin 0.4 0.0 - 1.2 mg/dL   GFR, Estimated >39 >39 mL/min    Comment: (NOTE) Calculated using the CKD-EPI Creatinine Equation (2021)    Anion gap 11 5 - 15    Comment: Performed at [facility], [address] [place], [place] [address]  Troponin I (High Sensitivity)     Status: None   Collection Time: 04/12/24 11:52 AM  Result Value Ref Range   Troponin I (High Sensitivity) 5 <18 ng/L    Comment: (NOTE) Elevated high sensitivity troponin I (hsTnI) values and significant  changes across serial measurements may suggest ACS but many other  chronic and acute conditions are known to elevate hsTnI results.  Refer to the Links section for chest pain algorithms and additional  guidance. Performed at [facility], [address], [place], [place] [address]   Lactate dehydrogenase     Status: None   Collection Time: 04/12/24 11:52 AM  Result Value Ref Range   LDH 146 105 - 235 U/L    Comment: Please note change in reference range. Performed at [facility], [address], [place], [place] [address]   Uric acid     Status: None   Collection Time: 04/12/24 11:52 AM  Result Value Ref Range   Uric Acid, Serum 4.3 2.5 - 7.1 mg/dL    Comment: HEMOLYSIS AT THIS LEVEL MAY AFFECT RESULT Performed at [facility], [address], [place], [place] [address]   Troponin I (High Sensitivity)     Status: None   Collection Time: 04/12/24 12:39 PM  Result Value Ref Range   Troponin I (High Sensitivity) 4 <18 ng/L    Comment: (NOTE) Elevated high sensitivity  troponin I (hsTnI) values and significant  changes across serial measurements may suggest ACS but many other  chronic and acute conditions are known to elevate hsTnI results.  Refer to the Links section for chest pain algorithms and additional  guidance. Performed at [facility], [address] [place], [place] [address]     Review of Systems  Eyes:  Positive for visual disturbance.  Cardiovascular:  Positive for chest pain.  Gastrointestinal:  Negative for abdominal pain.  Neurological:  Positive for headaches.   Physical Exam   Blood pressure (!) 159/90, pulse (!) 56, temperature 98.3 F (36.8 C), temperature source Oral, resp. rate 16, SpO2 99%, unknown if currently breastfeeding. Patient Vitals for the past 24 hrs:  BP Temp Temp src Pulse Resp SpO2  04/12/24 1327 125/63 98.3 F (36.8 C) Oral (!) 50 15 98 %  04/12/24 1308 113/72 -- -- (!) 50 -- 98 %  04/12/24 1258 121/69 -- -- (!) 49 16 95 %  04/12/24 1248 (!) 140/74 -- -- (!) 46 18 98 %  04/12/24 1202 (!) 142/89 -- -- (!) 55 -- --  04/12/24 1144 (!) 145/77 -- -- (!) 52 -- --  04/12/24 1131 (!) 144/89 -- -- (!) 55 -- --  04/12/24 1121 (!) 149/85 -- -- (!) 58 -- --  04/12/24 1100 (!) 160/77 -- -- (!) 58 -- 99 %  04/12/24 1046 (!) 143/94 -- -- 70 -- --  04/12/24 1031 (!) 154/81 -- -- (!) 56 -- --  04/12/24 1016 (!) 159/90 -- -- (!) 56 16 99 %  04/12/24 1010 -- -- -- -- -- 99 %  04/12/24 1007 (!) 154/85 98.3 F (36.8 C) Oral 60 18 --     Physical Exam Constitutional:      General: She is not in acute distress.    Appearance: She is well-developed. She is ill-appearing. She is not toxic-appearing.  HENT:     Head: Normocephalic.  Pulmonary:     Effort: Pulmonary effort is normal.  Musculoskeletal:     Right lower leg: Edema present.     Left lower leg: Edema present.  Neurological:     Mental Status: She is alert and oriented to person, place, and time.     GCS: GCS eye subscore is 4. GCS verbal subscore  is 5. GCS motor subscore is 6.     Deep Tendon Reflexes: Reflexes normal.    MAU Course  Procedures  MDM  1 severe range BP, no others severe range.  C-xray ordered  Troponin, CBC, CMP, BNP ordered. IV in place by RN Fioricet  and flexeril  ordered for HA.  Spoke to Dr. Armond at 1052- recommend admission for magnesium  for severe pp preeclampsia Rose Manning  to MAU to admit patient and resume care of the patient.   Assessment and Plan   A:  1. Hypertension in pregnancy, preeclampsia, severe, delivered/postpartum      P:   Care turned over to MD  Shanen Norris, Delon FERNS, NP 04/12/2024 2:28 PM

## 2024-04-13 ENCOUNTER — Encounter (HOSPITAL_COMMUNITY): Payer: Self-pay | Admitting: Obstetrics and Gynecology

## 2024-04-13 ENCOUNTER — Other Ambulatory Visit: Payer: Self-pay

## 2024-04-13 ENCOUNTER — Other Ambulatory Visit (HOSPITAL_COMMUNITY): Payer: Self-pay

## 2024-04-13 LAB — COMPREHENSIVE METABOLIC PANEL WITH GFR
ALT: 19 U/L (ref 0–44)
AST: 22 U/L (ref 15–41)
Albumin: 2.4 g/dL — ABNORMAL LOW (ref 3.5–5.0)
Alkaline Phosphatase: 91 U/L (ref 38–126)
Anion gap: 12 (ref 5–15)
BUN: 5 mg/dL — ABNORMAL LOW (ref 6–20)
CO2: 26 mmol/L (ref 22–32)
Calcium: 7.2 mg/dL — ABNORMAL LOW (ref 8.9–10.3)
Chloride: 105 mmol/L (ref 98–111)
Creatinine, Ser: 0.57 mg/dL (ref 0.44–1.00)
GFR, Estimated: >60 mL/min (ref >60)
Glucose, Bld: 67 mg/dL — ABNORMAL LOW (ref 70–99)
Potassium: 4.1 mmol/L (ref 3.5–5.1)
Sodium: 143 mmol/L (ref 135–145)
Total Bilirubin: 0.5 mg/dL (ref 0.0–1.2)
Total Protein: 5.9 g/dL — ABNORMAL LOW (ref 6.5–8.1)

## 2024-04-13 LAB — CBC
HCT: 33.3 % — ABNORMAL LOW (ref 36.0–46.0)
Hemoglobin: 10.3 g/dL — ABNORMAL LOW (ref 12.0–15.0)
MCH: 25.7 pg — ABNORMAL LOW (ref 26.0–34.0)
MCHC: 30.9 g/dL (ref 30.0–36.0)
MCV: 83 fL (ref 80.0–100.0)
Platelets: 214 K/uL (ref 150–400)
RBC: 4.01 MIL/uL (ref 3.87–5.11)
WBC: 4.8 K/uL (ref 4.0–10.5)
nRBC: 0 % (ref 0.0–0.2)

## 2024-04-13 LAB — MAGNESIUM: Magnesium: 5.4 mg/dL — ABNORMAL HIGH (ref 1.7–2.4)

## 2024-04-13 MED ORDER — ZOLPIDEM TARTRATE 5 MG PO TABS: 5.0000 mg | ORAL_TABLET | Freq: Every evening | ORAL | Status: DC | PRN

## 2024-04-13 MED ORDER — ONDANSETRON HCL 4 MG/2ML IJ SOLN: 4.0000 mg | Freq: Three times a day (TID) | INTRAMUSCULAR | Status: DC | PRN

## 2024-04-13 MED ORDER — ONDANSETRON HCL 4 MG/2ML IJ SOLN
4.0000 mg | Freq: Three times a day (TID) | INTRAMUSCULAR | Status: DC
  Administered 2024-04-13: 4 mg via INTRAVENOUS

## 2024-04-13 MED ORDER — LABETALOL HCL 200 MG PO TABS
200.0000 mg | ORAL_TABLET | Freq: Two times a day (BID) | ORAL | Status: DC
  Administered 2024-04-14: 200 mg via ORAL
  Filled 2024-04-13: qty 1

## 2024-04-13 MED ORDER — COCONUT OIL OIL
1.0000 | TOPICAL_OIL | Status: DC | PRN
  Administered 2024-04-13: 1 via TOPICAL

## 2024-04-13 MED ORDER — ONDANSETRON HCL 4 MG/2ML IJ SOLN
INTRAMUSCULAR | Status: AC
  Filled 2024-04-13: qty 2

## 2024-04-13 MED ORDER — DIPHENHYDRAMINE HCL 50 MG/ML IJ SOLN
INTRAMUSCULAR | Status: AC
  Filled 2024-04-13: qty 1

## 2024-04-13 MED ORDER — VALACYCLOVIR HCL 500 MG PO TABS
500.0000 mg | ORAL_TABLET | Freq: Two times a day (BID) | ORAL | Status: DC
  Administered 2024-04-13 – 2024-04-14 (×2): 500 mg via ORAL
  Filled 2024-04-13 (×2): qty 1

## 2024-04-13 MED ORDER — BUTALBITAL-APAP-CAFFEINE 50-325-40 MG PO TABS
1.0000 | ORAL_TABLET | Freq: Four times a day (QID) | ORAL | 1 refills | Status: AC | PRN
  Filled 2024-04-13 – 2024-04-25 (×2): qty 30, 4d supply, fill #0

## 2024-04-13 MED ORDER — DIPHENHYDRAMINE HCL 50 MG/ML IJ SOLN
25.0000 mg | Freq: Once | INTRAMUSCULAR | Status: AC
  Administered 2024-04-13: 25 mg via INTRAVENOUS

## 2024-04-13 MED ORDER — NIFEDIPINE ER OSMOTIC RELEASE 30 MG PO TB24
30.0000 mg | ORAL_TABLET | Freq: Every day | ORAL | Status: DC
  Administered 2024-04-13: 30 mg via ORAL
  Filled 2024-04-13: qty 1

## 2024-04-13 NOTE — Progress Notes (Signed)
 Called by Lauraine, RN d/t an allergic reaction to the procardia  and the only other intake pt had was a coconut drink and she states she is not allergic to coconut.  Will d/c the procardia  xl and switch to labetalol .  Pt says her sxs are much better now s/p benadryl  and never had any problems with breathing or scratchy throat.

## 2024-04-13 NOTE — Discharge Summary (Addendum)
 Physician Discharge Summary  Patient ID: Rose Manning MRN: [id number] DOB/AGE: 1990/07/24 33 y.o.  Admit date: 04/12/2024 Discharge date: 04/14/2024  Admission Diagnoses: HA and elevated blood pressure Discharge Diagnoses:  Principal Problem:   Preeclampsia in postpartum period   Discharged Condition: good  Hospital Course: Pt stabilized on magnesium  sulfate for 24hrs with normalization of BPs and HA minimized and controlled on fioricet .  Pt anticipating discharge once magnesium  stopped and said her HA is much better and she is ready to see her baby. Pt's BP mildly elevated at 4hrs, so will start procardia  xl 30mg  and observe overnight.  Consults: None  Significant Diagnostic Studies: cxray and labs  Treatments: Magnesium  Sulfate and pain meds  Discharge Exam: Blood pressure 139/80, pulse 69, temperature 97.8 F (36.6 C), temperature source Oral, resp. rate 18, SpO2 100%, unknown if currently breastfeeding. Please see progress note for today for physical exam  Disposition:  Discharge disposition: 01-Home or Self Care        Allergies as of 04/14/2024       Reactions   Shellfish Allergy Anaphylaxis   Pt states was taken to ED and given epipen    Fluoxetine Other (See Comments)   Made depression worse   Procardia  Xl [nifedipine ] Swelling, Rash        Medication List     TAKE these medications    acetaminophen  500 MG tablet Commonly known as: TYLENOL  Take 2 tablets (1,000 mg total) by mouth every 6 (six) hours.   butalbital -acetaminophen -caffeine  50-325-40 MG tablet Commonly known as: FIORICET  Take 1-2 tablets by mouth every 6 (six) hours as needed for headache or migraine. Not to exceed 6 tablets per day   FUSION PLUS PO Take 1 tablet by mouth daily.   hydrochlorothiazide  25 MG tablet Commonly known as: HYDRODIURIL  Take 1 tablet (25 mg total) by mouth daily for 5 days.   ibuprofen  600 MG tablet Commonly known as: ADVIL  Take 1 tablet (600 mg  total) by mouth every 6 (six) hours.   iron  polysaccharides 150 MG capsule Commonly known as: NIFEREX Take 1 capsule (150 mg total) by mouth daily.   labetalol  200 MG tablet Commonly known as: NORMODYNE  Take 1 tablet (200 mg total) by mouth 2 (two) times daily.   magnesium  oxide 400 (240 Mg) MG tablet Commonly known as: MAG-OX Take 1 tablet (400 mg total) by mouth daily.   prenatal multivitamin Tabs tablet Take 1 tablet by mouth daily at 12 noon.   valACYclovir  500 MG tablet Commonly known as: VALTREX  Take 1 tablet (500 mg total) by mouth daily as needed (Suppression of HSV syomptoms). Take for suppression of symptoms daily as needed       ASK your doctor about these medications    oxyCODONE  5 MG immediate release tablet Commonly known as: Oxy IR/ROXICODONE  Take 1 tablet (5 mg total) by mouth every 6 (six) hours as needed for up to 5 days for moderate pain (pain score 4-6) or severe pain (pain score 7-10). Ask about: Should I take this medication?        Follow-up Information     [facility] Follow up in 1 week(s).   Specialty: Obstetrics and Gynecology Why: for blood pressure check and it may be a lab visit if no appointments available Contact information: [address] [place]  [address] [phone]                Signed: Jon CINDERELLA Rummer 04/13/2024, 4:32 PM  Updated  by Mercer Peal, II CNM

## 2024-04-13 NOTE — Progress Notes (Addendum)
 Post Partum Day 7 Subjective: up ad lib, voiding, tolerating PO, + flatus, and mild HA but much better and controlled with toradol  and fioricet .  She reports no further chest pain.  Objective: Blood pressure 126/75, pulse (!) 59, temperature 97.8 F (36.6 C), temperature source Oral, resp. rate 18, SpO2 100%, unknown if currently breastfeeding.  Physical Exam:  General: alert, cooperative, and no distress Lochia: appropriate Uterine Fundus: FF, NT Incision: steristrips intact DVT Evaluation: no calf tenderness  Recent Labs    04/12/24 1152 04/13/24 0413  HGB 9.3* 10.3*  HCT 28.9* 33.3*    Assessment/Plan: Doing well s/p Mg for 24hrs.  BPs under good control without medication.  Pt is ready to go home.  Will observe per protocol for the 4hrs post mg and if continues to do well will discharge home with precautions on fioricet . Pt instructed to f/u in the office in a week for BP check. D/c instructions reviewed.  Questions answered.  Pt's BP mildly elevated at 4hrs so will observe overnight and start procardia  xl 30mg .   LOS: 1 day   Jon CINDERELLA Rummer, MD 04/13/2024, 1:25 PM

## 2024-04-14 ENCOUNTER — Other Ambulatory Visit (HOSPITAL_COMMUNITY): Payer: Self-pay

## 2024-04-14 MED ORDER — VALACYCLOVIR HCL 500 MG PO TABS
500.0000 mg | ORAL_TABLET | Freq: Every day | ORAL | 2 refills | Status: AC | PRN
  Filled 2024-04-14: qty 20, 20d supply, fill #0

## 2024-04-14 MED ORDER — HYDROCHLOROTHIAZIDE 25 MG PO TABS: 25.0000 mg | ORAL_TABLET | Freq: Every day | ORAL | Status: DC

## 2024-04-14 MED ORDER — LABETALOL HCL 200 MG PO TABS
200.0000 mg | ORAL_TABLET | Freq: Two times a day (BID) | ORAL | 0 refills | Status: AC
  Filled 2024-04-14: qty 60, 30d supply, fill #0

## 2024-04-14 MED ORDER — HYDROCHLOROTHIAZIDE 25 MG PO TABS
25.0000 mg | ORAL_TABLET | Freq: Every day | ORAL | 0 refills | Status: AC
  Filled 2024-04-14: qty 5, 5d supply, fill #0

## 2024-04-20 ENCOUNTER — Telehealth (HOSPITAL_COMMUNITY): Payer: Self-pay | Admitting: *Deleted

## 2024-04-20 NOTE — Telephone Encounter (Signed)
 04/20/2024  Name: Rose Manning MRN: [id number] DOB: 10-Oct-1990  Reason for Call:  Transition of Care Hospital Discharge Call  Contact Status: Patient Contact Status: Complete  Language assistant needed: Interpreter Mode: Interpreter Not Needed        Follow-Up Questions: Do You Have Any Concerns About Your Health As You Heal From Delivery?: No Do You Have Any Concerns About Your Infants Health?: No  Edinburgh Postnatal Depression Scale:  In the Past 7 Days: I have been able to laugh and see the funny side of things.: As much as I always could I have looked forward with enjoyment to things.: As much as I ever did I have blamed myself unnecessarily when things went wrong.: Yes, some of the time I have been anxious or worried for no good reason.: Yes, sometimes I have felt scared or panicky for no good reason.: No, not much Things have been getting on top of me.: No, I have been coping as well as ever I have been so unhappy that I have had difficulty sleeping.: Not at all I have felt sad or miserable.: Not very often I have been so unhappy that I have been crying.: Only occasionally The thought of harming myself has occurred to me.: Never Edinburgh Postnatal Depression Scale Total: 7  PHQ2-9 Depression Scale:     Discharge Follow-up: Edinburgh score requires follow up?: No Patient was advised of the following resources:: Support Group, Breastfeeding Support Group  Post-discharge interventions: Reviewed Newborn Safe Sleep Practices  Mliss Sieve, RN 04/20/2024 14:37

## 2024-04-25 ENCOUNTER — Other Ambulatory Visit (HOSPITAL_BASED_OUTPATIENT_CLINIC_OR_DEPARTMENT_OTHER): Payer: Self-pay
